# Patient Record
Sex: Female | Born: 1935 | Race: White | Hispanic: No | Marital: Married | Smoking: Never smoker
Health system: Southern US, Community
[De-identification: ages and names within clinical notes are randomized; demographics above are authoritative.]

## PROBLEM LIST (undated history)

## (undated) DIAGNOSIS — I10 Essential (primary) hypertension: Secondary | ICD-10-CM

## (undated) DIAGNOSIS — J704 Drug-induced interstitial lung disorders, unspecified: Secondary | ICD-10-CM

## (undated) DIAGNOSIS — R112 Nausea with vomiting, unspecified: Secondary | ICD-10-CM

## (undated) DIAGNOSIS — M1990 Unspecified osteoarthritis, unspecified site: Secondary | ICD-10-CM

## (undated) DIAGNOSIS — Z9889 Other specified postprocedural states: Secondary | ICD-10-CM

## (undated) DIAGNOSIS — Z87440 Personal history of urinary (tract) infections: Secondary | ICD-10-CM

## (undated) HISTORY — PX: ABDOMINAL HYSTERECTOMY: SHX81

## (undated) HISTORY — PX: OTHER SURGICAL HISTORY: SHX169

## (undated) HISTORY — PX: POLYPECTOMY: SHX149

## (undated) HISTORY — PX: ROTATOR CUFF REPAIR: SHX139

## (undated) HISTORY — PX: SHOULDER SURGERY: SHX246

---

## 1999-05-10 ENCOUNTER — Other Ambulatory Visit: Admission: RE | Admit: 1999-05-10 | Discharge: 1999-05-10 | Payer: Self-pay | Admitting: Gynecology

## 2000-03-02 ENCOUNTER — Encounter: Admission: RE | Admit: 2000-03-02 | Discharge: 2000-03-02 | Payer: Self-pay | Admitting: Rheumatology

## 2000-03-02 ENCOUNTER — Encounter: Payer: Self-pay | Admitting: Rheumatology

## 2000-05-15 ENCOUNTER — Other Ambulatory Visit: Admission: RE | Admit: 2000-05-15 | Discharge: 2000-05-15 | Payer: Self-pay | Admitting: Gynecology

## 2000-06-07 ENCOUNTER — Encounter: Payer: Self-pay | Admitting: Gynecology

## 2000-06-07 ENCOUNTER — Encounter: Admission: RE | Admit: 2000-06-07 | Discharge: 2000-06-07 | Payer: Self-pay | Admitting: Gynecology

## 2000-06-27 ENCOUNTER — Ambulatory Visit (HOSPITAL_COMMUNITY): Admission: RE | Admit: 2000-06-27 | Discharge: 2000-06-27 | Payer: Self-pay | Admitting: Gastroenterology

## 2001-03-13 ENCOUNTER — Encounter: Payer: Self-pay | Admitting: Rheumatology

## 2001-03-13 ENCOUNTER — Encounter: Admission: RE | Admit: 2001-03-13 | Discharge: 2001-03-13 | Payer: Self-pay | Admitting: Rheumatology

## 2001-05-21 ENCOUNTER — Other Ambulatory Visit: Admission: RE | Admit: 2001-05-21 | Discharge: 2001-05-21 | Payer: Self-pay | Admitting: Gynecology

## 2002-03-14 ENCOUNTER — Encounter: Payer: Self-pay | Admitting: Rheumatology

## 2002-03-14 ENCOUNTER — Encounter: Admission: RE | Admit: 2002-03-14 | Discharge: 2002-03-14 | Payer: Self-pay | Admitting: Rheumatology

## 2002-05-22 ENCOUNTER — Other Ambulatory Visit: Admission: RE | Admit: 2002-05-22 | Discharge: 2002-05-22 | Payer: Self-pay | Admitting: Gynecology

## 2003-03-28 ENCOUNTER — Encounter: Payer: Self-pay | Admitting: Rheumatology

## 2003-03-28 ENCOUNTER — Encounter: Admission: RE | Admit: 2003-03-28 | Discharge: 2003-03-28 | Payer: Self-pay | Admitting: Rheumatology

## 2003-05-27 ENCOUNTER — Other Ambulatory Visit: Admission: RE | Admit: 2003-05-27 | Discharge: 2003-05-27 | Payer: Self-pay | Admitting: Gynecology

## 2004-03-31 ENCOUNTER — Encounter: Admission: RE | Admit: 2004-03-31 | Discharge: 2004-03-31 | Payer: Self-pay | Admitting: Rheumatology

## 2004-05-31 ENCOUNTER — Other Ambulatory Visit: Admission: RE | Admit: 2004-05-31 | Discharge: 2004-05-31 | Payer: Self-pay | Admitting: Gynecology

## 2005-04-29 ENCOUNTER — Encounter: Admission: RE | Admit: 2005-04-29 | Discharge: 2005-04-29 | Payer: Self-pay | Admitting: Rheumatology

## 2005-06-13 ENCOUNTER — Other Ambulatory Visit: Admission: RE | Admit: 2005-06-13 | Discharge: 2005-06-13 | Payer: Self-pay | Admitting: Gynecology

## 2006-05-01 ENCOUNTER — Encounter: Admission: RE | Admit: 2006-05-01 | Discharge: 2006-05-01 | Payer: Self-pay | Admitting: Family Medicine

## 2006-05-05 ENCOUNTER — Encounter: Admission: RE | Admit: 2006-05-05 | Discharge: 2006-05-05 | Payer: Self-pay | Admitting: Family Medicine

## 2006-07-11 ENCOUNTER — Other Ambulatory Visit: Admission: RE | Admit: 2006-07-11 | Discharge: 2006-07-11 | Payer: Self-pay | Admitting: Gynecology

## 2006-11-03 ENCOUNTER — Encounter: Admission: RE | Admit: 2006-11-03 | Discharge: 2006-11-03 | Payer: Self-pay | Admitting: Family Medicine

## 2007-05-03 ENCOUNTER — Encounter: Admission: RE | Admit: 2007-05-03 | Discharge: 2007-05-03 | Payer: Self-pay | Admitting: Family Medicine

## 2007-07-16 ENCOUNTER — Other Ambulatory Visit: Admission: RE | Admit: 2007-07-16 | Discharge: 2007-07-16 | Payer: Self-pay | Admitting: Gynecology

## 2008-05-05 ENCOUNTER — Encounter: Admission: RE | Admit: 2008-05-05 | Discharge: 2008-05-05 | Payer: Self-pay | Admitting: Family Medicine

## 2008-05-28 ENCOUNTER — Encounter (HOSPITAL_BASED_OUTPATIENT_CLINIC_OR_DEPARTMENT_OTHER): Payer: Self-pay | Admitting: General Surgery

## 2008-05-28 ENCOUNTER — Ambulatory Visit (HOSPITAL_BASED_OUTPATIENT_CLINIC_OR_DEPARTMENT_OTHER): Admission: RE | Admit: 2008-05-28 | Discharge: 2008-05-28 | Payer: Self-pay | Admitting: General Surgery

## 2009-04-08 ENCOUNTER — Encounter: Admission: RE | Admit: 2009-04-08 | Discharge: 2009-04-08 | Payer: Self-pay | Admitting: Gastroenterology

## 2009-05-06 ENCOUNTER — Encounter: Admission: RE | Admit: 2009-05-06 | Discharge: 2009-05-06 | Payer: Self-pay | Admitting: Family Medicine

## 2009-07-27 ENCOUNTER — Encounter: Admission: RE | Admit: 2009-07-27 | Discharge: 2009-07-27 | Payer: Self-pay | Admitting: Family Medicine

## 2009-10-19 ENCOUNTER — Encounter: Admission: RE | Admit: 2009-10-19 | Discharge: 2009-10-19 | Payer: Self-pay | Admitting: Family Medicine

## 2010-05-07 ENCOUNTER — Encounter: Admission: RE | Admit: 2010-05-07 | Discharge: 2010-05-07 | Payer: Self-pay | Admitting: Family Medicine

## 2010-10-24 ENCOUNTER — Encounter: Payer: Self-pay | Admitting: Family Medicine

## 2011-02-15 NOTE — Op Note (Signed)
NAMEMARIJOSE, Freeman               ACCOUNT NO.:  [id number]   MEDICAL RECORD NO.:  [id number]          PATIENT TYPE:  AMB   LOCATION:  DSC                          FACILITY:  [facility]   PHYSICIAN:  Leonie Man, M.D.   DATE OF BIRTH:  06-Jul-1936   DATE OF PROCEDURE:  05/28/2008  DATE OF DISCHARGE:                               OPERATIVE REPORT   PREOPERATIVE DIAGNOSIS:  Lesion of left anal verge, 4 o'clock axis.   POSTOPERATIVE DIAGNOSIS:  Lesion of left anal verge, 4 o'clock axis.   PROCEDURES:  Excision of lesion at the anal verge, 4 o'clock axis.   SURGEON:  Leonie Man, MD   ASSISTANT:  OR tech.   ANESTHESIA:  General.   NOTE:  [name] is a 75 year old lady with what appears to be  an epidermoid inclusion cyst at the anal verge which has been causing  her some discomfort.  This waxed and waned in size.  This is not  consistent with an external hemorrhoid.  She comes to the operating room  for excision of this mass after the risks and potential benefits of  surgery have been discussed.  All questions answered and consent  obtained.   PROCEDURE:  The patient positioned in the prone jackknife position with  the buttock cheeks spread apart after following induction of  satisfactory general anesthesia.  This area was prepped and draped to be  included in a sterile operative field and the place of the area was  outlined in elliptical incision around the anal lesion and infiltrated  with 0.5% Marcaine with epinephrine.  An elliptical incision was made  around the lesion.  The lesion was excised in its entirety and removed  and forwarded for pathologic evaluation.  Hemostasis was obtained with  electrocautery.  The skin and subcutaneous tissues were closed with a  running suture of 4-0 chromic catgut.  Sterile dressing applied.  The  anesthetic reversed.  The patient removed from the operating room to the  recovery room in stable condition.  She tolerated the  procedure well.      Leonie Man, M.D.  Electronically Signed     PB/MEDQ  D:  05/28/2008  T:  05/28/2008  Job:  [id number]

## 2011-04-12 ENCOUNTER — Other Ambulatory Visit: Payer: Self-pay | Admitting: Family Medicine

## 2011-04-12 DIAGNOSIS — Z1231 Encounter for screening mammogram for malignant neoplasm of breast: Secondary | ICD-10-CM

## 2011-05-10 ENCOUNTER — Ambulatory Visit
Admission: RE | Admit: 2011-05-10 | Discharge: 2011-05-10 | Disposition: A | Discharge status: Home / Self Care | Payer: Medicare Other | Source: Ambulatory Visit | Attending: Family Medicine | Admitting: Family Medicine

## 2011-05-10 DIAGNOSIS — Z1231 Encounter for screening mammogram for malignant neoplasm of breast: Secondary | ICD-10-CM

## 2011-10-17 ENCOUNTER — Other Ambulatory Visit: Payer: Self-pay | Admitting: Dermatology

## 2011-10-27 ENCOUNTER — Other Ambulatory Visit: Payer: Self-pay | Admitting: Family Medicine

## 2011-10-27 DIAGNOSIS — M545 Low back pain: Secondary | ICD-10-CM

## 2011-10-31 ENCOUNTER — Ambulatory Visit
Admission: RE | Admit: 2011-10-31 | Discharge: 2011-10-31 | Disposition: A | Discharge status: Home / Self Care | Payer: Medicare Other | Source: Ambulatory Visit | Attending: Family Medicine | Admitting: Family Medicine

## 2011-10-31 DIAGNOSIS — M545 Low back pain: Secondary | ICD-10-CM

## 2012-02-16 ENCOUNTER — Other Ambulatory Visit: Payer: Self-pay | Admitting: Orthopedic Surgery

## 2012-02-16 NOTE — Progress Notes (Signed)
Preoperative surgical orders have been place into the Epic hospital system for Casey Freeman on 02/16/2012, 5:42 PM  by Patrica Duel for surgery on 05/01/2012.  Preop Total Knee orders including Bupivacaine On-Q pump, IV Tylenol, and IV Decadron as long as there are no contraindications to the above medications.

## 2012-04-18 NOTE — H&P (Signed)
Mauricio Po DOB: 1935/11/02  Chief Complaint: right knee pain   History of Present Illness The patient is a 76 year old female who comes in today for a preoperative History and Physical. The patient is scheduled for a right total knee arthroplasty to be performed by Dr. Gus Rankin. Aluisio, MD at [facility] on Tuesday May 01, 2012 . Kendyl has been dealing with right knee pain for over a year. She has seen Dr. Rennis Chris, had injections which helped for about six months or so. She said that pain has gotten much worse. It is becoming more activity limiting. It is hurting her at night. She is not getting any significant swelling. She is not having lower extremity weakness or paresthesia. The cortisone helped much better with the initial injections but the recent ones were not as good. Due to failure of conservative measures, the most predictable means for increased pain and decreased function of the right knee is a right total knee arthroplasty. Risks and benefits of the surgery discussed.   Problem List/Past Medical Tear, medial meniscus, knee, current (836.0).  Sprain/strain, rotator cuff (840.4) Enthesopathy, ankle NOS (726.70).  Tendinitis, tibialis (726.72).  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  Hypertension Varicose veins Diverticulosis Urinary Tract Infection   Allergies No Known Drug Allergies.    Family History Hypertension. mother and father Kidney disease. brother Cancer. brother Cerebrovascular Accident. father Diabetes Mellitus. mother Father. deceased age 19 due to heart disease, MI, CVA Mother. deceased age 27 due to complications from DM Siblings. esophageal/throat cancer, kidney failure   Social History Drug/Alcohol Rehab (Currently). no Drug/Alcohol Rehab (Previously). no Children. 1 Alcohol use. never consumed alcohol Exercise. Exercises rarely Number of flights of stairs before winded. 2-3 Marital status. married Pain  Contract. no Illicit drug use. 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 never smoker Tobacco / smoke exposure. no Post-Surgical Plans. home with husband Advance Directives. living will, healthcare POA   Medication History Lisinopril-Hydrochlorothiazide (20-25MG  Tablet, Oral) Active. LORazepam ( Oral) Specific dose unknown - Active. Vivelle-Dot (0.1MG /24HR Patch Biweekly, Transdermal) Active. Premarin (0.625MG /GM Cream, Vaginal) Active. Calcium Citrate (200MG  Tablet, 1 Oral) Active. Multiple Vitamin (1 Oral) Active. Vitamin D (2000UNIT Tablet, 1 Oral) Active. Magnesium Gluconate (500MG  Tablet, Oral) Active. Fish Oil Active. Aspirin EC (81MG  Tablet DR, Oral) Active. Cranberry Plus Vitamin C (4200-20-3MG -MG-UNIT Capsule, Oral) Active.     Past Surgical History Arthroscopy of Shoulder. left 2001 Colon Polyp Removal - Colonoscopy Hysterectomy. complete (non-cancerous) 1983 Rotator Cuff Repair. right 2011 Gluteal cyst removal 2009 Cataract Extraction-Bilateral. 2012   Review of Systems General:Not Present- Chills, Fever, Night Sweats, Fatigue, Weight Gain, Weight Loss and Memory Loss. Skin:Not Present- Hives, Itching, Rash, Eczema and Lesions. HEENT:Not Present- Tinnitus, Headache, Double Vision, Visual Loss, Hearing Loss and Dentures. Respiratory:Not Present- Shortness of breath with exertion, Shortness of breath at rest, Allergies, Coughing up blood and Chronic Cough. Cardiovascular:Not Present- Chest Pain, Racing/skipping heartbeats, Difficulty Breathing Lying Down, Murmur, Swelling and Palpitations. Gastrointestinal:Not Present- Bloody Stool, Heartburn, Abdominal Pain, Vomiting, Nausea, Constipation, Diarrhea, Difficulty Swallowing, Jaundice and Loss of appetitie. Female Genitourinary:Not Present- Blood in Urine, Urinary frequency, Weak urinary stream, Discharge, Flank Pain, Incontinence, Painful Urination,  Urgency, Urinary Retention and Urinating at Night. Musculoskeletal:Present- Joint Swelling, Joint Pain and Morning Stiffness. Not Present- Muscle Weakness, Muscle Pain, Back Pain and Spasms. Neurological:Not Present- Tremor, Dizziness, Blackout spells, Paralysis, Difficulty with balance and Weakness. Psychiatric:Not Present- Insomnia.   Vitals Weight: 198 lb Height: 63 in Body Surface Area: 2 m  Body Mass Index: 35.07 kg/m Pulse: 75 (Regular) BP: 126/72 (Sitting, Right Arm, Standard)    Physical Exam General Mental Status - Alert, cooperative and good historian. General Appearance- pleasant. Not in acute distress. Orientation- Oriented X3. Build & Nutrition- Obese and Well developed. Head and Neck Head- normocephalic, atraumatic . Neck Global Assessment- supple. no bruit auscultated on the right and no bruit auscultated on the left. Eye Pupil- Bilateral- Regular and Round. Motion- Bilateral- EOMI. Chest and Lung Exam Auscultation: Breath sounds:- clear at anterior chest wall and - clear at posterior chest wall. Adventitious sounds:- No Adventitious sounds. Cardiovascular Auscultation:Rhythm- Regular rate and rhythm. Heart Sounds- S1 WNL and S2 WNL. Murmurs & Other Heart Sounds:Auscultation of the heart reveals - No Murmurs. Abdomen Inspection:Contour- Obese. Palpation/Percussion:Tenderness- Abdomen is non-tender to palpation. Rigidity (guarding)- Abdomen is soft. Auscultation:Auscultation of the abdomen reveals - Bowel sounds normal. Female Genitourinary Not done, not pertinent to present illness Peripheral Vascular Upper Extremity: Palpation:- Pulses bilaterally normal. Lower Extremity: Palpation:- Pulses bilaterally normal Neurologic Examination of related systems reveals - normal muscle strength and tone in all extremities. Neurologic evaluation reveals - normal sensation and upper and lower extremity deep tendon  reflexes intact bilaterally . Musculoskeletal Both knees show no effusion. Range is about 10 to about 95 degrees. She is tender medial greater than lateral. There is no instability noted. Left knee exam is unremarkable. Normal painless motion in the hips.   RADIOGRAPHS: AP both knees and lateral show severe endstage arthritis, right worse than left knee, with bone on bone deformity, varus deformity and tibial subluxation medially. Left knee shows milder change.   Assessment & Plan Osteoarthritis, knee (715.96) Right total knee arthroplasty     Dimitri Ped, PA-C

## 2012-04-19 ENCOUNTER — Encounter (HOSPITAL_COMMUNITY): Payer: Self-pay | Admitting: Pharmacy Technician

## 2012-04-23 ENCOUNTER — Ambulatory Visit (HOSPITAL_COMMUNITY)
Admission: RE | Admit: 2012-04-23 | Discharge: 2012-04-23 | Disposition: A | Discharge status: Home / Self Care | Payer: Medicare Other | Source: Ambulatory Visit | Attending: Orthopedic Surgery | Admitting: Orthopedic Surgery

## 2012-04-23 ENCOUNTER — Encounter (HOSPITAL_COMMUNITY)
Admission: RE | Admit: 2012-04-23 | Discharge: 2012-04-23 | Disposition: A | Discharge status: Home / Self Care | Payer: Medicare Other | Source: Ambulatory Visit | Attending: Orthopedic Surgery | Admitting: Orthopedic Surgery

## 2012-04-23 ENCOUNTER — Encounter (HOSPITAL_COMMUNITY): Payer: Self-pay

## 2012-04-23 DIAGNOSIS — M47814 Spondylosis without myelopathy or radiculopathy, thoracic region: Secondary | ICD-10-CM | POA: Insufficient documentation

## 2012-04-23 DIAGNOSIS — Z01812 Encounter for preprocedural laboratory examination: Secondary | ICD-10-CM | POA: Insufficient documentation

## 2012-04-23 DIAGNOSIS — M1710 Unilateral primary osteoarthritis, unspecified knee: Secondary | ICD-10-CM | POA: Insufficient documentation

## 2012-04-23 DIAGNOSIS — Z01810 Encounter for preprocedural cardiovascular examination: Secondary | ICD-10-CM | POA: Insufficient documentation

## 2012-04-23 HISTORY — DX: Personal history of urinary (tract) infections: Z87.440

## 2012-04-23 HISTORY — DX: Other specified postprocedural states: Z98.890

## 2012-04-23 HISTORY — DX: Essential (primary) hypertension: I10

## 2012-04-23 HISTORY — DX: Nausea with vomiting, unspecified: R11.2

## 2012-04-23 HISTORY — DX: Unspecified osteoarthritis, unspecified site: M19.90

## 2012-04-23 LAB — COMPREHENSIVE METABOLIC PANEL
ALT: 9 U/L (ref 0–35)
AST: 16 U/L (ref 0–37)
Albumin: 4 g/dL (ref 3.5–5.2)
Calcium: 9.7 mg/dL (ref 8.4–10.5)
Chloride: 99 mEq/L (ref 96–112)
Creatinine, Ser: 0.58 mg/dL (ref 0.50–1.10)
Sodium: 136 mEq/L (ref 135–145)

## 2012-04-23 LAB — SURGICAL PCR SCREEN
MRSA, PCR: NEGATIVE
Staphylococcus aureus: NEGATIVE

## 2012-04-23 LAB — URINALYSIS, ROUTINE W REFLEX MICROSCOPIC
Leukocytes, UA: NEGATIVE
Nitrite: NEGATIVE
Specific Gravity, Urine: 1.014 (ref 1.005–1.030)
pH: 7 (ref 5.0–8.0)

## 2012-04-23 LAB — CBC
MCH: 31.1 pg (ref 26.0–34.0)
MCV: 91.6 fL (ref 78.0–100.0)
Platelets: 247 10*3/uL (ref 150–400)
RDW: 12.8 % (ref 11.5–15.5)
WBC: 6.7 10*3/uL (ref 4.0–10.5)

## 2012-04-23 LAB — PROTIME-INR: INR: 1.01 (ref 0.00–1.49)

## 2012-04-23 MED ORDER — CHLORHEXIDINE GLUCONATE 4 % EX LIQD
60.0000 mL | Freq: Once | CUTANEOUS | Status: DC
  Filled 2012-04-23: qty 60

## 2012-04-23 NOTE — Patient Instructions (Addendum)
20 CIMBERLY STOFFEL  04/23/2012   Your procedure is scheduled on:  05-01-12 at 2:30 pm  Report to SHORT STAY DEPT  at 12:00 PM.  Call this number if you have problems the morning of surgery: [phone]   Remember:   Do not eat food or drink liquids AFTER MIDNIGHT  May have clear liquids UNTIL 6 HOURS BEFORE SURGERY (8:30 am)  Clear liquids include soda, tea, black coffee, apple or grape juice, broth.  Take these medicines the morning of surgery with A SIP OF WATER: NONE   Do not wear jewelry, make-up or nail polish.  Do not wear lotions, powders, or perfumes.   Do not shave legs or underarms 48 hrs. before surgery (men may shave face)  Do not bring valuables to the hospital.  Contacts, dentures or bridgework may not be worn into surgery.  Leave suitcase in the car. After surgery it may be brought to your room.  For patients admitted to the hospital, checkout time is 11:00 AM the day of discharge.   Patients discharged the day of surgery will not be allowed to drive home.    Special Instructions:   Please read over the following fact sheets that you were given: MRSA  Information/ Incentive Spirometer               SHOWER WITH BETASEPT THE NIGHT BEFORE SURGERY AND THE MORNING OF SURGERY

## 2012-05-01 ENCOUNTER — Encounter (HOSPITAL_COMMUNITY): Payer: Self-pay | Admitting: Anesthesiology

## 2012-05-01 ENCOUNTER — Encounter (HOSPITAL_COMMUNITY)
Admission: RE | Disposition: A | Discharge status: Home Health | Payer: Self-pay | Source: Ambulatory Visit | Attending: Orthopedic Surgery

## 2012-05-01 ENCOUNTER — Ambulatory Visit (HOSPITAL_COMMUNITY): Payer: Medicare Other | Admitting: Anesthesiology

## 2012-05-01 ENCOUNTER — Inpatient Hospital Stay (HOSPITAL_COMMUNITY)
Admission: RE | Admit: 2012-05-01 | Discharge: 2012-05-04 | DRG: 470 | Disposition: A | Discharge status: Home Health | Payer: Medicare Other | Source: Ambulatory Visit | Attending: Orthopedic Surgery | Admitting: Orthopedic Surgery

## 2012-05-01 ENCOUNTER — Encounter (HOSPITAL_COMMUNITY): Payer: Self-pay | Admitting: *Deleted

## 2012-05-01 DIAGNOSIS — E876 Hypokalemia: Secondary | ICD-10-CM | POA: Diagnosis not present

## 2012-05-01 DIAGNOSIS — M1710 Unilateral primary osteoarthritis, unspecified knee: Principal | ICD-10-CM | POA: Diagnosis present

## 2012-05-01 DIAGNOSIS — Z96659 Presence of unspecified artificial knee joint: Secondary | ICD-10-CM

## 2012-05-01 HISTORY — PX: TOTAL KNEE ARTHROPLASTY: SHX125

## 2012-05-01 LAB — TYPE AND SCREEN: ABO/RH(D): A POS

## 2012-05-01 SURGERY — ARTHROPLASTY, KNEE, TOTAL
Anesthesia: Spinal | Site: Knee | Laterality: Right | Wound class: Clean

## 2012-05-01 MED ORDER — LACTATED RINGERS IV SOLN: INTRAVENOUS | Status: DC

## 2012-05-01 MED ORDER — LACTATED RINGERS IV SOLN
INTRAVENOUS | Status: DC | PRN
  Administered 2012-05-01 (×2): via INTRAVENOUS

## 2012-05-01 MED ORDER — HYDROMORPHONE HCL PF 1 MG/ML IJ SOLN: 0.2500 mg | INTRAMUSCULAR | Status: DC | PRN

## 2012-05-01 MED ORDER — PROPOFOL 10 MG/ML IV EMUL
INTRAVENOUS | Status: DC | PRN
  Administered 2012-05-01: 75 ug/kg/min via INTRAVENOUS

## 2012-05-01 MED ORDER — TRAMADOL HCL 50 MG PO TABS: 50.0000 mg | ORAL_TABLET | Freq: Four times a day (QID) | ORAL | Status: DC | PRN

## 2012-05-01 MED ORDER — DIPHENHYDRAMINE HCL 12.5 MG/5ML PO ELIX: 12.5000 mg | ORAL_SOLUTION | Freq: Four times a day (QID) | ORAL | Status: DC | PRN

## 2012-05-01 MED ORDER — ONDANSETRON HCL 4 MG/2ML IJ SOLN
4.0000 mg | Freq: Four times a day (QID) | INTRAMUSCULAR | Status: DC | PRN
  Administered 2012-05-01: 4 mg via INTRAVENOUS

## 2012-05-01 MED ORDER — FENTANYL CITRATE 0.05 MG/ML IJ SOLN
INTRAMUSCULAR | Status: DC | PRN
  Administered 2012-05-01: 50 ug via INTRAVENOUS

## 2012-05-01 MED ORDER — BUPIVACAINE 0.25 % ON-Q PUMP SINGLE CATH 300ML
INJECTION | Status: DC | PRN
  Administered 2012-05-01: 300 mL

## 2012-05-01 MED ORDER — NALOXONE HCL 0.4 MG/ML IJ SOLN: 0.4000 mg | INTRAMUSCULAR | Status: DC | PRN

## 2012-05-01 MED ORDER — POLYETHYLENE GLYCOL 3350 17 G PO PACK: 17.0000 g | PACK | Freq: Every day | ORAL | Status: DC | PRN

## 2012-05-01 MED ORDER — LORAZEPAM 0.5 MG PO TABS
0.5000 mg | ORAL_TABLET | Freq: Two times a day (BID) | ORAL | Status: DC
  Administered 2012-05-01 – 2012-05-03 (×3): 0.5 mg via ORAL
  Filled 2012-05-01 (×5): qty 1

## 2012-05-01 MED ORDER — BUPIVACAINE ON-Q PAIN PUMP (FOR ORDER SET NO CHG)
INJECTION | Status: DC
  Filled 2012-05-01: qty 1

## 2012-05-01 MED ORDER — METHOCARBAMOL 100 MG/ML IJ SOLN
500.0000 mg | Freq: Four times a day (QID) | INTRAMUSCULAR | Status: DC | PRN
  Administered 2012-05-01: 500 mg via INTRAVENOUS
  Filled 2012-05-01: qty 5

## 2012-05-01 MED ORDER — ONDANSETRON HCL 4 MG/2ML IJ SOLN
4.0000 mg | Freq: Four times a day (QID) | INTRAMUSCULAR | Status: DC | PRN
  Filled 2012-05-01: qty 2

## 2012-05-01 MED ORDER — DIPHENHYDRAMINE HCL 50 MG/ML IJ SOLN: 12.5000 mg | Freq: Four times a day (QID) | INTRAMUSCULAR | Status: DC | PRN

## 2012-05-01 MED ORDER — CEFAZOLIN SODIUM 1-5 GM-% IV SOLN
1.0000 g | Freq: Four times a day (QID) | INTRAVENOUS | Status: AC
  Administered 2012-05-01 – 2012-05-02 (×2): 1 g via INTRAVENOUS
  Filled 2012-05-01 (×2): qty 50

## 2012-05-01 MED ORDER — ACETAMINOPHEN 10 MG/ML IV SOLN
INTRAVENOUS | Status: AC
  Filled 2012-05-01: qty 100

## 2012-05-01 MED ORDER — LACTATED RINGERS IV SOLN
INTRAVENOUS | Status: DC
  Administered 2012-05-01: 1000 mL via INTRAVENOUS

## 2012-05-01 MED ORDER — METOCLOPRAMIDE HCL 10 MG PO TABS: 5.0000 mg | ORAL_TABLET | Freq: Three times a day (TID) | ORAL | Status: DC | PRN

## 2012-05-01 MED ORDER — MIDAZOLAM HCL 5 MG/5ML IJ SOLN
INTRAMUSCULAR | Status: DC | PRN
  Administered 2012-05-01: 0.5 mg via INTRAVENOUS

## 2012-05-01 MED ORDER — ACETAMINOPHEN 325 MG PO TABS
650.0000 mg | ORAL_TABLET | Freq: Four times a day (QID) | ORAL | Status: DC | PRN
  Administered 2012-05-03 (×2): 650 mg via ORAL
  Filled 2012-05-01 (×2): qty 2

## 2012-05-01 MED ORDER — DIPHENHYDRAMINE HCL 12.5 MG/5ML PO ELIX: 12.5000 mg | ORAL_SOLUTION | ORAL | Status: DC | PRN

## 2012-05-01 MED ORDER — CEFAZOLIN SODIUM-DEXTROSE 2-3 GM-% IV SOLR
INTRAVENOUS | Status: AC
  Filled 2012-05-01: qty 50

## 2012-05-01 MED ORDER — SODIUM CHLORIDE 0.9 % IJ SOLN: 9.0000 mL | INTRAMUSCULAR | Status: DC | PRN

## 2012-05-01 MED ORDER — METOCLOPRAMIDE HCL 5 MG/ML IJ SOLN
5.0000 mg | Freq: Three times a day (TID) | INTRAMUSCULAR | Status: DC | PRN
Start: 2012-05-01 — End: 2012-05-04
  Administered 2012-05-02: 10 mg via INTRAVENOUS
  Filled 2012-05-01: qty 2

## 2012-05-01 MED ORDER — BUPIVACAINE IN DEXTROSE 0.75-8.25 % IT SOLN
INTRATHECAL | Status: DC | PRN
  Administered 2012-05-01: 1.8 mL via INTRATHECAL

## 2012-05-01 MED ORDER — BISACODYL 10 MG RE SUPP: 10.0000 mg | Freq: Every day | RECTAL | Status: DC | PRN

## 2012-05-01 MED ORDER — MORPHINE SULFATE (PF) 1 MG/ML IV SOLN
INTRAVENOUS | Status: DC
  Administered 2012-05-01: 14.94 mg via INTRAVENOUS
  Administered 2012-05-01: 1.8 mg via INTRAVENOUS
  Administered 2012-05-02: 10 mg via INTRAVENOUS
  Filled 2012-05-01: qty 25

## 2012-05-01 MED ORDER — MORPHINE SULFATE (PF) 1 MG/ML IV SOLN
INTRAVENOUS | Status: DC
  Administered 2012-05-01: 18:00:00 via INTRAVENOUS

## 2012-05-01 MED ORDER — FLEET ENEMA 7-19 GM/118ML RE ENEM: 1.0000 | ENEMA | Freq: Once | RECTAL | Status: AC | PRN

## 2012-05-01 MED ORDER — ONDANSETRON HCL 4 MG PO TABS
4.0000 mg | ORAL_TABLET | Freq: Four times a day (QID) | ORAL | Status: DC | PRN
  Administered 2012-05-02: 4 mg via ORAL
  Filled 2012-05-01: qty 1

## 2012-05-01 MED ORDER — DEXAMETHASONE SODIUM PHOSPHATE 10 MG/ML IJ SOLN
10.0000 mg | Freq: Once | INTRAMUSCULAR | Status: DC
  Filled 2012-05-01: qty 1

## 2012-05-01 MED ORDER — ACETAMINOPHEN 650 MG RE SUPP: 650.0000 mg | Freq: Four times a day (QID) | RECTAL | Status: DC | PRN

## 2012-05-01 MED ORDER — OXYCODONE HCL 5 MG PO TABS
5.0000 mg | ORAL_TABLET | ORAL | Status: DC | PRN
  Administered 2012-05-02: 10 mg via ORAL
  Administered 2012-05-02 – 2012-05-03 (×3): 5 mg via ORAL
  Administered 2012-05-03: 10 mg via ORAL
  Administered 2012-05-03 – 2012-05-04 (×7): 5 mg via ORAL
  Filled 2012-05-01: qty 2
  Filled 2012-05-01 (×5): qty 1
  Filled 2012-05-01: qty 2
  Filled 2012-05-01: qty 1
  Filled 2012-05-01: qty 2
  Filled 2012-05-01 (×3): qty 1

## 2012-05-01 MED ORDER — MEPERIDINE HCL 50 MG/ML IJ SOLN: 6.2500 mg | INTRAMUSCULAR | Status: DC | PRN

## 2012-05-01 MED ORDER — PHENOL 1.4 % MT LIQD: 1.0000 | OROMUCOSAL | Status: DC | PRN

## 2012-05-01 MED ORDER — ACETAMINOPHEN 10 MG/ML IV SOLN
1000.0000 mg | Freq: Four times a day (QID) | INTRAVENOUS | Status: AC
  Administered 2012-05-01 – 2012-05-02 (×4): 1000 mg via INTRAVENOUS
  Filled 2012-05-01 (×7): qty 100

## 2012-05-01 MED ORDER — ACETAMINOPHEN 10 MG/ML IV SOLN
INTRAVENOUS | Status: DC | PRN
  Administered 2012-05-01: 1000 mg via INTRAVENOUS

## 2012-05-01 MED ORDER — DOCUSATE SODIUM 100 MG PO CAPS
100.0000 mg | ORAL_CAPSULE | Freq: Two times a day (BID) | ORAL | Status: DC
  Administered 2012-05-01 – 2012-05-03 (×5): 100 mg via ORAL

## 2012-05-01 MED ORDER — PROMETHAZINE HCL 25 MG/ML IJ SOLN: 6.2500 mg | INTRAMUSCULAR | Status: DC | PRN

## 2012-05-01 MED ORDER — BUPIVACAINE 0.25 % ON-Q PUMP SINGLE CATH 300ML
300.0000 mL | INJECTION | Status: DC
  Filled 2012-05-01: qty 300

## 2012-05-01 MED ORDER — METHOCARBAMOL 500 MG PO TABS
500.0000 mg | ORAL_TABLET | Freq: Four times a day (QID) | ORAL | Status: DC | PRN
  Administered 2012-05-02 – 2012-05-04 (×4): 500 mg via ORAL
  Filled 2012-05-01 (×4): qty 1

## 2012-05-01 MED ORDER — CEFAZOLIN SODIUM-DEXTROSE 2-3 GM-% IV SOLR
2.0000 g | INTRAVENOUS | Status: AC
  Administered 2012-05-01: 2 g via INTRAVENOUS

## 2012-05-01 MED ORDER — MORPHINE SULFATE (PF) 1 MG/ML IV SOLN
INTRAVENOUS | Status: AC
  Filled 2012-05-01: qty 25

## 2012-05-01 MED ORDER — RIVAROXABAN 10 MG PO TABS
10.0000 mg | ORAL_TABLET | Freq: Every day | ORAL | Status: DC
  Administered 2012-05-02 – 2012-05-04 (×3): 10 mg via ORAL
  Filled 2012-05-01 (×4): qty 1

## 2012-05-01 MED ORDER — MENTHOL 3 MG MT LOZG
1.0000 | LOZENGE | OROMUCOSAL | Status: DC | PRN
  Filled 2012-05-01: qty 9

## 2012-05-01 MED ORDER — ACETAMINOPHEN 10 MG/ML IV SOLN: 1000.0000 mg | Freq: Once | INTRAVENOUS | Status: DC

## 2012-05-01 MED ORDER — SODIUM CHLORIDE 0.9 % IV SOLN: INTRAVENOUS | Status: DC

## 2012-05-01 MED ORDER — DEXTROSE-NACL 5-0.9 % IV SOLN
INTRAVENOUS | Status: DC
  Administered 2012-05-01 – 2012-05-02 (×2): via INTRAVENOUS

## 2012-05-01 MED ORDER — 0.9 % SODIUM CHLORIDE (POUR BTL) OPTIME
TOPICAL | Status: DC | PRN
  Administered 2012-05-01: 1000 mL

## 2012-05-01 MED ORDER — ONDANSETRON HCL 4 MG/2ML IJ SOLN: 4.0000 mg | Freq: Four times a day (QID) | INTRAMUSCULAR | Status: DC | PRN

## 2012-05-01 SURGICAL SUPPLY — 55 items
BAG SPEC THK2 15X12 ZIP CLS (MISCELLANEOUS) ×1
BAG ZIPLOCK 12X15 (MISCELLANEOUS) ×2 IMPLANT
BANDAGE ELASTIC 6 VELCRO ST LF (GAUZE/BANDAGES/DRESSINGS) ×2 IMPLANT
BANDAGE ESMARK 6X9 LF (GAUZE/BANDAGES/DRESSINGS) ×1 IMPLANT
BLADE SAG 18X100X1.27 (BLADE) ×2 IMPLANT
BLADE SAW SGTL 11.0X1.19X90.0M (BLADE) ×2 IMPLANT
BNDG CMPR 9X6 STRL LF SNTH (GAUZE/BANDAGES/DRESSINGS) ×1
BNDG ESMARK 6X9 LF (GAUZE/BANDAGES/DRESSINGS) ×2
BOWL SMART MIX CTS (DISPOSABLE) ×2 IMPLANT
CATH KIT ON-Q SILVERSOAK 5 (CATHETERS) ×1 IMPLANT
CATH KIT ON-Q SILVERSOAK 5IN (CATHETERS) ×2 IMPLANT
CEMENT HV SMART SET (Cement) ×4 IMPLANT
CLOTH BEACON ORANGE TIMEOUT ST (SAFETY) ×2 IMPLANT
CLSR STERI-STRIP ANTIMIC 1/2X4 (GAUZE/BANDAGES/DRESSINGS) ×1 IMPLANT
CUFF TOURN SGL QUICK 34 (TOURNIQUET CUFF) ×2
CUFF TRNQT CYL 34X4X40X1 (TOURNIQUET CUFF) ×1 IMPLANT
DRAPE EXTREMITY T 121X128X90 (DRAPE) ×2 IMPLANT
DRAPE POUCH INSTRU U-SHP 10X18 (DRAPES) ×2 IMPLANT
DRAPE U-SHAPE 47X51 STRL (DRAPES) ×2 IMPLANT
DRSG ADAPTIC 3X8 NADH LF (GAUZE/BANDAGES/DRESSINGS) ×2 IMPLANT
DRSG PAD ABDOMINAL 8X10 ST (GAUZE/BANDAGES/DRESSINGS) ×1 IMPLANT
DURAPREP 26ML APPLICATOR (WOUND CARE) ×2 IMPLANT
ELECT REM PT RETURN 9FT ADLT (ELECTROSURGICAL) ×2
ELECTRODE REM PT RTRN 9FT ADLT (ELECTROSURGICAL) ×1 IMPLANT
EVACUATOR 1/8 PVC DRAIN (DRAIN) ×2 IMPLANT
FACESHIELD LNG OPTICON STERILE (SAFETY) ×10 IMPLANT
GLOVE BIO SURGEON STRL SZ7.5 (GLOVE) ×1 IMPLANT
GLOVE BIO SURGEON STRL SZ8 (GLOVE) ×2 IMPLANT
GLOVE BIOGEL PI IND STRL 8 (GLOVE) ×2 IMPLANT
GLOVE BIOGEL PI INDICATOR 8 (GLOVE) ×2
GOWN STRL NON-REIN LRG LVL3 (GOWN DISPOSABLE) ×2 IMPLANT
GOWN STRL REIN XL XLG (GOWN DISPOSABLE) ×2 IMPLANT
HANDPIECE INTERPULSE COAX TIP (DISPOSABLE) ×2
IMMOBILIZER KNEE 20 (SOFTGOODS) ×2
IMMOBILIZER KNEE 20 THIGH 36 (SOFTGOODS) ×1 IMPLANT
KIT BASIN OR (CUSTOM PROCEDURE TRAY) ×2 IMPLANT
MANIFOLD NEPTUNE II (INSTRUMENTS) ×2 IMPLANT
NS IRRIG 1000ML POUR BTL (IV SOLUTION) ×2 IMPLANT
PACK TOTAL JOINT (CUSTOM PROCEDURE TRAY) ×2 IMPLANT
PAD ABD 7.5X8 STRL (GAUZE/BANDAGES/DRESSINGS) ×1 IMPLANT
PADDING CAST COTTON 6X4 STRL (CAST SUPPLIES) ×4 IMPLANT
POSITIONER SURGICAL ARM (MISCELLANEOUS) ×2 IMPLANT
SET HNDPC FAN SPRY TIP SCT (DISPOSABLE) ×1 IMPLANT
SPONGE GAUZE 4X4 12PLY (GAUZE/BANDAGES/DRESSINGS) ×2 IMPLANT
STRIP CLOSURE SKIN 1/2X4 (GAUZE/BANDAGES/DRESSINGS) ×4 IMPLANT
SUCTION FRAZIER 12FR DISP (SUCTIONS) ×2 IMPLANT
SUT MNCRL AB 4-0 PS2 18 (SUTURE) ×2 IMPLANT
SUT PDS AB 1 CT1 27 (SUTURE) ×3 IMPLANT
SUT VIC AB 2-0 CT1 27 (SUTURE) ×6
SUT VIC AB 2-0 CT1 TAPERPNT 27 (SUTURE) ×3 IMPLANT
SUT VLOC 180 0 24IN GS25 (SUTURE) ×2 IMPLANT
TOWEL OR 17X26 10 PK STRL BLUE (TOWEL DISPOSABLE) ×4 IMPLANT
TRAY FOLEY CATH 14FRSI W/METER (CATHETERS) ×2 IMPLANT
WATER STERILE IRR 1500ML POUR (IV SOLUTION) ×2 IMPLANT
WRAP KNEE MAXI GEL POST OP (GAUZE/BANDAGES/DRESSINGS) ×3 IMPLANT

## 2012-05-01 NOTE — Transfer of Care (Signed)
Immediate Anesthesia Transfer of Care Note  Patient: Casey Freeman  Procedure(s) Performed: Procedure(s) (LRB): TOTAL KNEE ARTHROPLASTY (Right)  Patient Location: PACU  Anesthesia Type: Regional and Spinal  Level of Consciousness: awake, alert , oriented and patient cooperative  Airway & Oxygen Therapy: Patient Spontanous Breathing and Patient connected to face mask oxygen  Post-op Assessment: Report given to PACU RN and Post -op Vital signs reviewed and stable  Post vital signs: Reviewed and stable  Complications: No apparent anesthesia complications

## 2012-05-01 NOTE — Interval H&P Note (Signed)
History and Physical Interval Note:  05/01/2012 2:39 PM  Casey Freeman  has presented today for surgery, with the diagnosis of Osteoarthritis of the Right Knee  The various methods of treatment have been discussed with the patient and family. After consideration of risks, benefits and other options for treatment, the patient has consented to  Procedure(s) (LRB): TOTAL KNEE ARTHROPLASTY (Right) as a surgical intervention .  The patient's history has been reviewed, patient examined, no change in status, stable for surgery.  I have reviewed the patient's chart and labs.  Questions were answered to the patient's satisfaction.     Loanne Drilling

## 2012-05-01 NOTE — Anesthesia Preprocedure Evaluation (Addendum)
Anesthesia Evaluation    History of Anesthesia Complications (+) PONV  Airway Mallampati: II TM Distance: >3 FB Neck ROM: Full    Dental No notable dental hx. (+) Partial Lower and Partial Upper   Pulmonary  breath sounds clear to auscultation  Pulmonary exam normal       Cardiovascular hypertension, Pt. on medications Rhythm:Regular Rate:Normal     Neuro/Psych    GI/Hepatic   Endo/Other    Renal/GU      Musculoskeletal   Abdominal   Peds  Hematology   Anesthesia Other Findings   Reproductive/Obstetrics                          Anesthesia Physical Anesthesia Plan  ASA: II  Anesthesia Plan: Spinal   Post-op Pain Management:    Induction: Intravenous  Airway Management Planned: Simple Face Mask  Additional Equipment:   Intra-op Plan:   Post-operative Plan:   Informed Consent: I have reviewed the patients History and Physical, chart, labs and discussed the procedure including the risks, benefits and alternatives for the proposed anesthesia with the patient or authorized representative who has indicated his/her understanding and acceptance.   Dental advisory given  Plan Discussed with: CRNA  Anesthesia Plan Comments:         Anesthesia Quick Evaluation

## 2012-05-01 NOTE — Op Note (Signed)
Pre-operative diagnosis- Osteoarthritis  Right knee(s)  Post-operative diagnosis- Osteoarthritis Right knee(s)  Procedure-  Right  Total Knee Arthroplasty  Surgeon- Gus Rankin. Tanise Russman, MD  Assistant- Dimitri Ped, PA-C   Anesthesia-  Spinal EBL-* No blood loss amount entered *  Drains Hemovac  Tourniquet time-  Total Tourniquet Time Documented: Thigh (Right) - 42 minutes   Complications- None  Condition-PACU - hemodynamically stable.   Brief Clinical Note  TENECIA IGNASIAK is a 76 y.o. year old female with end stage OA of her right knee with progressively worsening pain and dysfunction. She has constant pain, with activity and at rest and significant functional deficits with difficulties even with ADLs. She has had extensive non-op management including analgesics, injections of cortisone and viscosupplements, and home exercise program, but remains in significant pain with significant dysfunction.Radiographs show bone on bone arthritis patellofemoral with grooving of the anterior cortex of the femur. She presents now for right Total Knee Arthroplasty.    Procedure in detail---   The patient is brought into the operating room and positioned supine on the operating table. After successful administration of  Spinal,   a tourniquet is placed high on the  Right thigh(s) and the lower extremity is prepped and draped in the usual sterile fashion. Time out is performed by the operating team and then the  Right lower extremity is wrapped in Esmarch, knee flexed and the tourniquet inflated to 300 mmHg.       A midline incision is made with a ten blade through the subcutaneous tissue to the level of the extensor mechanism. A fresh blade is used to make a medial parapatellar arthrotomy. Soft tissue over the proximal medial tibia is subperiosteally elevated to the joint line with a knife and into the semimembranosus bursa with a Cobb elevator. Soft tissue over the proximal lateral tibia is elevated with  attention being paid to avoiding the patellar tendon on the tibial tubercle. The patella is everted, knee flexed 90 degrees and the ACL and PCL are removed. Findings are bone on bone patellofemoral and medial with large patellar and medial tibial spurs.        The drill is used to create a starting hole in the distal femur and the canal is thoroughly irrigated with sterile saline to remove the fatty contents. The 5 degree Right  valgus alignment guide is placed into the femoral canal and the distal femoral cutting block is pinned to remove 10 mm off the distal femur. Resection is made with an oscillating saw.      The tibia is subluxed forward and the menisci are removed. The extramedullary alignment guide is placed referencing proximally at the medial aspect of the tibial tubercle and distally along the second metatarsal axis and tibial crest. The block is pinned to remove 2mm off the more deficient medial  side. Resection is made with an oscillating saw. Size 2.5is the most appropriate size for the tibia and the proximal tibia is prepared with the modular drill and keel punch for that size.      The femoral sizing guide is placed and size 2.5 is most appropriate. Rotation is marked off the epicondylar axis and confirmed by creating a rectangular flexion gap at 90 degrees. The size 2.5 cutting block is pinned in this rotation and the anterior, posterior and chamfer cuts are made with the oscillating saw. The intercondylar block is then placed and that cut is made.      Trial size 2.5 tibial component, trial  size 2.5 posterior stabilized femur and a 12.5  mm posterior stabilized rotating platform insert trial is placed. Full extension is achieved with excellent varus/valgus and anterior/posterior balance throughout full range of motion. The patella is everted and thickness measured to be 22  mm. Free hand resection is taken to 12 mm, a 35 template is placed, lug holes are drilled, trial patella is placed, and it  tracks normally. Osteophytes are removed off the posterior femur with the trial in place. All trials are removed and the cut bone surfaces prepared with pulsatile lavage. Cement is mixed and once ready for implantation, the size 2.5 tibial implant, size  2.5 posterior stabilized femoral component, and the size 35 patella are cemented in place and the patella is held with the clamp. The trial insert is placed and the knee held in full extension. All extruded cement is removed and once the cement is hard the permanent 12.5 mm posterior stabilized rotating platform insert is placed into the tibial tray.      The wound is copiously irrigated with saline solution and the extensor mechanism closed over a hemovac drain with #1 PDS suture. The tourniquet is released for a total tourniquet time of 43  minutes. Flexion against gravity is 140 degrees and the patella tracks normally. Subcutaneous tissue is closed with 2.0 vicryl and subcuticular with running 4.0 Monocryl. The catheter for the Marcaine pain pump is placed and the pump is initiated. The incision is cleaned and dried and steri-strips and a bulky sterile dressing are applied. The limb is placed into a knee immobilizer and the patient is awakened and transported to recovery in stable condition.      Please note that a surgical assistant was a medical necessity for this procedure in order to perform it in a safe and expeditious manner. Surgical assistant was necessary to retract the ligaments and vital neurovascular structures to prevent injury to them and also necessary for proper positioning of the limb to allow for anatomic placement of the prosthesis.   Gus Rankin Leigh Kaeding, MD    05/01/2012, 4:55 PM

## 2012-05-01 NOTE — Anesthesia Postprocedure Evaluation (Signed)
  Anesthesia Post-op Note  Patient: Casey Freeman  Procedure(s) Performed: Procedure(s) (LRB): TOTAL KNEE ARTHROPLASTY (Right)  Patient Location: PACU  Anesthesia Type: Spinal  Level of Consciousness: awake and alert   Airway and Oxygen Therapy: Patient Spontanous Breathing  Post-op Pain: mild  Post-op Assessment: Post-op Vital signs reviewed, Patient's Cardiovascular Status Stable, Respiratory Function Stable, Patent Airway and No signs of Nausea or vomiting  Post-op Vital Signs: stable  Complications: No apparent anesthesia complications

## 2012-05-01 NOTE — Plan of Care (Signed)
Problem: Consults Goal: Diagnosis- Total Joint Replacement Right total knee     

## 2012-05-01 NOTE — Anesthesia Procedure Notes (Signed)
Spinal Patient location during procedure: OR Staffing Anesthesiologist: Rufino Staup Performed by: anesthesiologist  Preanesthetic Checklist Completed: patient identified, site marked, surgical consent, pre-op evaluation, timeout performed, IV checked, risks and benefits discussed and monitors and equipment checked Spinal Block Patient position: sitting Prep: Betadine Patient monitoring: heart rate, continuous pulse ox and blood pressure Approach: midline Location: L3-4 Injection technique: single-shot Needle Needle type: Spinocan  Needle gauge: 22 G Needle length: 9 cm Additional Notes Expiration date of kit checked and confirmed. Patient tolerated procedure well, without complications.     

## 2012-05-02 ENCOUNTER — Encounter (HOSPITAL_COMMUNITY): Payer: Self-pay | Admitting: Orthopedic Surgery

## 2012-05-02 LAB — BASIC METABOLIC PANEL
CO2: 25 mEq/L (ref 19–32)
Calcium: 9 mg/dL (ref 8.4–10.5)
Creatinine, Ser: 0.59 mg/dL (ref 0.50–1.10)
GFR calc Af Amer: >90 mL/min (ref >90)

## 2012-05-02 LAB — CBC
MCH: 31.2 pg (ref 26.0–34.0)
MCV: 92.5 fL (ref 78.0–100.0)
Platelets: 207 10*3/uL (ref 150–400)
RDW: 12.7 % (ref 11.5–15.5)

## 2012-05-02 MED ORDER — HYDROMORPHONE HCL PF 1 MG/ML IJ SOLN: 0.5000 mg | INTRAMUSCULAR | Status: DC | PRN

## 2012-05-02 NOTE — Plan of Care (Signed)
Problem: Consults Goal: Diagnosis- Total Joint Replacement Outcome: Completed/Met Date Met:  05/02/12 Right Total Knee

## 2012-05-02 NOTE — Progress Notes (Signed)
Physical Therapy Treatment Patient Details Name: Casey Freeman MRN: [id number] DOB: 11-06-1935 Today's Date: 05/02/2012 Time: 1430-1500 PT Time Calculation (min): 30 min  PT Assessment / Plan / Recommendation Comments on Treatment Session  Continuing to progress.     Follow Up Recommendations  Home health PT    Barriers to Discharge        Equipment Recommendations  None recommended by PT    Recommendations for Other Services    Frequency 7X/week   Plan Discharge plan remains appropriate    Precautions / Restrictions Precautions Precautions: Fall;Knee Required Braces or Orthoses: Knee Immobilizer - Right Knee Immobilizer - Right: Discontinue once straight leg raise with < 10 degree lag Restrictions Weight Bearing Restrictions: No RLE Weight Bearing: Weight bearing as tolerated   Pertinent Vitals/Pain     Mobility  Bed Mobility Bed Mobility: Sit to Supine Sit to Supine: 4: Min assist;With rail Details for Bed Mobility Assistance: Assist for R LE onto bed. VCs safety, technique,hand placement.  Transfers Transfers: Sit to Stand;Stand to Sit Sit to Stand: 4: Min assist;With upper extremity assist;With armrests;From chair/3-in-1 Stand to Sit: 4: Min assist;With upper extremity assist;With armrests;To bed Details for Transfer Assistance: VCs safety, technique, hand placement. Assist to rise,stabilize, control descent.  Ambulation/Gait Ambulation/Gait Assistance: 4: Min assist Ambulation Distance (Feet): 35 Feet Assistive device: Rolling walker Ambulation/Gait Assistance Details: VCs safety, technique, handp lacement. Assist to stabilize throughout ambulation.  Gait Pattern: Step-to pattern;Decreased stride length;Decreased step length - right;Decreased step length - left;Antalgic    Exercises Total Joint Exercises Ankle Circles/Pumps: AROM;Both;10 reps;Supine Quad Sets: AROM;Strengthening;Both;10 reps;Supine Short Arc Quad: AAROM;Strengthening;Right;10  reps;Supine Heel Slides: AAROM;Strengthening;Right;10 reps;Supine Hip ABduction/ADduction: AAROM;Strengthening;Right;10 reps;Supine Straight Leg Raises: AAROM;Strengthening;Right;10 reps;Supine   PT Diagnosis:    PT Problem List:   PT Treatment Interventions:     PT Goals Acute Rehab PT Goals Pt will go Sit to Supine/Side: with supervision PT Goal: Sit to Supine/Side - Progress: Progressing toward goal Pt will go Sit to Stand: with supervision PT Goal: Sit to Stand - Progress: Progressing toward goal Pt will Ambulate: 51 - 150 feet;with supervision;with least restrictive assistive device PT Goal: Ambulate - Progress: Progressing toward goal  Visit Information  Last PT Received On: 05/02/12 Assistance Needed: +1    Subjective Data  Subjective: "I have to do this" Patient Stated Goal: Home   Cognition  Overall Cognitive Status: Appears within functional limits for tasks assessed/performed Arousal/Alertness: Awake/alert Orientation Level: Appears intact for tasks assessed Behavior During Session: [facility] for tasks performed    Balance     End of Session PT - End of Session Equipment Utilized During Treatment: Gait belt;Right knee immobilizer Activity Tolerance: Patient tolerated treatment well Patient left: in bed;with call bell/phone within reach;with family/visitor present   GP     Rebeca Alert [facility] 05/02/2012, 4:29 PM [phone]

## 2012-05-02 NOTE — Evaluation (Signed)
Physical Therapy Evaluation Patient Details Name: Casey Freeman MRN: [id number] DOB: [birth year] Today's Date: 05/02/2012 Time: 4098-1191 PT Time Calculation (min): 23 min  PT Assessment / Plan / Recommendation Clinical Impression  76 yo female s/p R TKA. Plans to d/c home with husband. Limited mobility on eval due to pt c/o feeling "woozy" and nauseous. Recommend HHPT.     PT Assessment  Patient needs continued PT services    Follow Up Recommendations  Home health PT    Barriers to Discharge        Equipment Recommendations  None recommended by PT    Recommendations for Other Services OT consult   Frequency 7X/week    Precautions / Restrictions Precautions Precautions: Fall;Knee Required Braces or Orthoses: Knee Immobilizer - Right Knee Immobilizer - Right: Discontinue once straight leg raise with < 10 degree lag Restrictions Weight Bearing Restrictions: No RLE Weight Bearing: Weight bearing as tolerated   Pertinent Vitals/Pain       Mobility  Bed Mobility Bed Mobility: Supine to Sit Supine to Sit: HOB elevated;With rails Details for Bed Mobility Assistance: VCs safety, technique,hand placement. assist for R LE off bed.  Transfers Transfers: Sit to Stand;Stand to [employer] Transfers Sit to Stand: 3: Mod assist;With upper extremity assist;From bed Stand to Sit: 4: Min assist;With upper extremity assist;With armrests;To chair/3-in-1 Stand Pivot Transfers: 4: Min assist Details for Transfer Assistance: VCs safety, technique, hand placement. assist to rise, stabilize, control descent. Stand pivot bed>recliner with RW. Increased time.  Ambulation/Gait Ambulation/Gait Assistance: Not tested (comment) Ambulation/Gait Assistance Details: Deferred due to pt c/o feeling "woozy". Will attempt later this pm after pt has been upright for a while.  Gait Pattern: Step-to pattern;Antalgic    Exercises     PT Diagnosis: Difficulty walking;Abnormality of gait;Acute pain    PT Problem List: Decreased strength;Decreased range of motion;Decreased activity tolerance;Decreased mobility;Pain;Decreased knowledge of use of DME PT Treatment Interventions: DME instruction;Gait training;Stair training;Functional mobility training;Therapeutic activities;Therapeutic exercise;Patient/family education   PT Goals Acute Rehab PT Goals PT Goal Formulation: With patient/family Time For Goal Achievement: 05/09/12 Potential to Achieve Goals: Good Pt will go Supine/Side to Sit: with supervision PT Goal: Supine/Side to Sit - Progress: Goal set today Pt will go Sit to Supine/Side: with supervision PT Goal: Sit to Supine/Side - Progress: Goal set today Pt will go Sit to Stand: with supervision PT Goal: Sit to Stand - Progress: Goal set today Pt will Ambulate: 51 - 150 feet;with supervision;with least restrictive assistive device PT Goal: Ambulate - Progress: Goal set today Pt will Go Up / Down Stairs: 3-5 stairs;with min assist;with least restrictive assistive device (3 steps) PT Goal: Up/Down Stairs - Progress: Goal set today  Visit Information  Last PT Received On: 05/02/12 Assistance Needed: +2 (safety)    Subjective Data  Subjective: "I feel "woozy"" Patient Stated Goal: Home   Prior Functioning  Home Living Lives With: Spouse Available Help at Discharge: Family Type of Home: House Home Access: Stairs to enter Secretary/administrator of Steps: 3 Entrance Stairs-Rails: Right Home Layout: One level Home Adaptive Equipment: Walker - rolling;Bedside commode/3-in-1;Straight cane;Shower chair with back Prior Function Able to Take Stairs?: Yes Communication Communication: No difficulties    Cognition  Overall Cognitive Status: Appears within functional limits for tasks assessed/performed Arousal/Alertness: Awake/alert Orientation Level: Appears intact for tasks assessed Behavior During Session: [facility] for tasks performed    Extremity/Trunk Assessment Right Lower  Extremity Assessment RLE ROM/Strength/Tone: Deficits RLE ROM/Strength/Tone Deficits: SLR 2/5. moves ankle well RLE Sensation:  WFL - Light Touch Left Lower Extremity Assessment LLE ROM/Strength/Tone: WFL for tasks assessed   Balance    End of Session PT - End of Session Equipment Utilized During Treatment: Gait belt;Right knee immobilizer Activity Tolerance: Patient tolerated treatment well Patient left: in chair;with call bell/phone within reach;with family/visitor present  GP     Rebeca Alert [facility] 05/02/2012, 11:37 AM [phone]

## 2012-05-03 DIAGNOSIS — E876 Hypokalemia: Secondary | ICD-10-CM

## 2012-05-03 LAB — BASIC METABOLIC PANEL
BUN: 8 mg/dL (ref 6–23)
Chloride: 101 mEq/L (ref 96–112)
GFR calc Af Amer: >90 mL/min (ref >90)
Potassium: 3.4 mEq/L — ABNORMAL LOW (ref 3.5–5.1)
Sodium: 136 mEq/L (ref 135–145)

## 2012-05-03 LAB — CBC
HCT: 34.3 % — ABNORMAL LOW (ref 36.0–46.0)
RDW: 12.8 % (ref 11.5–15.5)
WBC: 9.5 10*3/uL (ref 4.0–10.5)

## 2012-05-03 MED ORDER — POTASSIUM CHLORIDE CRYS ER 20 MEQ PO TBCR
40.0000 meq | EXTENDED_RELEASE_TABLET | Freq: Two times a day (BID) | ORAL | Status: AC
  Administered 2012-05-03 (×2): 40 meq via ORAL
  Filled 2012-05-03 (×2): qty 2

## 2012-05-03 NOTE — Progress Notes (Signed)
Late Entry: at 21:41 found on-q pump tubing out of right knee.

## 2012-05-03 NOTE — Progress Notes (Signed)
   Subjective: 2 Days Post-Op Procedure(s) (LRB): TOTAL KNEE ARTHROPLASTY (Right) Patient reports pain as mild.   Patient seen in rounds with Dr. Lequita Halt. Patient is well, and has had no acute complaints or problems Plan is to go Home after hospital stay.  Objective: Vital signs in last 24 hours: Temp:  [98.2 F (36.8 C)-98.6 F (37 C)] 98.2 F (36.8 C) (08/01 0644) Pulse Rate:  [64-102] 102  (08/01 0644) Resp:  [12-16] 16  (08/01 0644) BP: (124-189)/(78-83) 189/83 mmHg (08/01 0644) SpO2:  [93 %-99 %] 93 % (08/01 0644)  Intake/Output from previous day:  Intake/Output Summary (Last 24 hours) at 05/03/12 1002 Last data filed at 05/03/12 0900  Gross per 24 hour  Intake   1603 ml  Output   1625 ml  Net    -22 ml    Intake/Output this shift: Total I/O In: 240 [P.O.:240] Out: -   Labs:  Basename 05/03/12 0355 05/02/12 0343  HGB 11.6* 12.9    Basename 05/03/12 0355 05/02/12 0343  WBC 9.5 11.2*  RBC 3.76* 4.14  HCT 34.3* 38.3  PLT 200 207    Basename 05/03/12 0355 05/02/12 0343  NA 136 136  K 3.4* 3.7  CL 101 100  CO2 26 25  BUN 8 12  CREATININE 0.59 0.59  GLUCOSE 127* 188*  CALCIUM 8.5 9.0   No results found for this basename: LABPT:2,INR:2 in the last 72 hours  EXAM General - Patient is Alert, Appropriate and Oriented Extremity - Neurovascular intact Sensation intact distally Dorsiflexion/Plantar flexion intact Dressing/Incision - clean, dry, no drainage, healing Motor Function - intact, moving foot and toes well on exam.   Past Medical History  Diagnosis Date  . PONV (postoperative nausea and vomiting)     >20 YRS AGO - NO PROBLEM SINCE  . Hypertension   . Arthritis   . Hx: UTI (urinary tract infection)     Assessment/Plan: 2 Days Post-Op Procedure(s) (LRB): TOTAL KNEE ARTHROPLASTY (Right) Principal Problem:  *OA (osteoarthritis) of knee Active Problems:  Postop Hypokalemia   Advance diet Up with therapy Plan for discharge  tomorrow Discharge home with home health  DVT Prophylaxis - Xarelto Weight-Bearing as tolerated to right leg  Casey Freeman 05/03/2012, 10:02 AM

## 2012-05-03 NOTE — Progress Notes (Signed)
Physical Therapy Treatment Patient Details Name: Casey Freeman MRN: [id number] DOB: 12-05-1935 Today's Date: 05/03/2012 Time: 4098-1191 PT Time Calculation (min): 14 min  PT Assessment / Plan / Recommendation Comments on Treatment Session  Progressing well. Plans for d/c home tomorrow    Follow Up Recommendations  Home health PT    Barriers to Discharge        Equipment Recommendations  None recommended by PT    Recommendations for Other Services    Frequency 7X/week   Plan Discharge plan remains appropriate    Precautions / Restrictions Precautions Precautions: Fall;Knee Required Braces or Orthoses: Knee Immobilizer - Right Knee Immobilizer - Right: Discontinue once straight leg raise with < 10 degree lag Restrictions Weight Bearing Restrictions: No RLE Weight Bearing: Weight bearing as tolerated   Pertinent Vitals/Pain     Mobility  Bed Mobility Bed Mobility: Supine to Sit Supine to Sit: 4: Min assist;HOB elevated;With rails Details for Bed Mobility Assistance: Assist for R LE off bed. VCs safety, technique. Pt a little impulsive.  Transfers Transfers: Sit to Stand;Stand to Sit Sit to Stand: 4: Min guard;With upper extremity assist;From bed Stand to Sit: 4: Min guard;With upper extremity assist;To chair/3-in-1 Details for Transfer Assistance: VCs safety, technique, hand placement. Pt a little impulsive-stood before RW was placed in front of her.  Ambulation/Gait Ambulation/Gait Assistance: 4: Min assist Ambulation Distance (Feet): 85 Feet Assistive device: Rolling walker Ambulation/Gait Assistance Details: VCs safety, sequence. Assist to stabilize throughout ambulation. Moves quickly.  Gait Pattern: Step-to pattern;Antalgic;Decreased step length - right    Exercises     PT Diagnosis:    PT Problem List:   PT Treatment Interventions:     PT Goals Acute Rehab PT Goals Pt will go Supine/Side to Sit: with supervision PT Goal: Supine/Side to Sit - Progress:  Progressing toward goal Pt will go Sit to Stand: with supervision PT Goal: Sit to Stand - Progress: Progressing toward goal Pt will Ambulate: 51 - 150 feet;with supervision;with least restrictive assistive device PT Goal: Ambulate - Progress: Progressing toward goal  Visit Information  Last PT Received On: 05/03/12 Assistance Needed: +1    Subjective Data  Subjective: "I'm ready" Patient Stated Goal: Home tomorrow   Cognition  Overall Cognitive Status: Appears within functional limits for tasks assessed/performed Arousal/Alertness: Awake/alert Orientation Level: Appears intact for tasks assessed Behavior During Session: [facility] for tasks performed    Balance     End of Session PT - End of Session Equipment Utilized During Treatment: Gait belt;Right knee immobilizer Activity Tolerance: Patient tolerated treatment well Patient left: in chair;with call bell/phone within reach   GP     Casey Freeman 05/03/2012, 12:16 PM [phone]

## 2012-05-03 NOTE — Progress Notes (Signed)
  LATE ENTRY NOTE Date of Service of Visit - 05/02/2012  Subjective: 1 Days Post-Op Procedure(s) (LRB): TOTAL KNEE ARTHROPLASTY (Right) Patient reports pain as mild.   Patient seen in rounds for Dr. Lequita Halt. Patient is well, and has had no acute complaints or problems We will start therapy today.  Plan is to go Home after hospital stay.  Objective: Vital signs in last 24 hours: Temp:  [97.6 F (36.4 C)-98.6 F (37 C)] 98.2 F (36.8 C) (08/01 0644) Pulse Rate:  [64-102] 102  (08/01 0644) Resp:  [12-16] 16  (08/01 0644) BP: (124-189)/(75-83) 189/83 mmHg (08/01 0644) SpO2:  [93 %-100 %] 93 % (08/01 0644)  Intake/Output from previous day:  Intake/Output Summary (Last 24 hours) at 05/03/12 0944 Last data filed at 05/03/12 0900  Gross per 24 hour  Intake   2498 ml  Output   1625 ml  Net    873 ml    Intake/Output this shift: Total I/O In: 240 [P.O.:240] Out: -   Labs:  Basename 05/03/12 0355 05/02/12 0343  HGB 11.6* 12.9    Basename 05/03/12 0355 05/02/12 0343  WBC 9.5 11.2*  RBC 3.76* 4.14  HCT 34.3* 38.3  PLT 200 207    Basename 05/03/12 0355 05/02/12 0343  NA 136 136  K 3.4* 3.7  CL 101 100  CO2 26 25  BUN 8 12  CREATININE 0.59 0.59  GLUCOSE 127* 188*  CALCIUM 8.5 9.0   No results found for this basename: LABPT:2,INR:2 in the last 72 hours  EXAM General - Patient is Alert, Appropriate and Oriented Extremity - Neurovascular intact Sensation intact distally Dorsiflexion/Plantar flexion intact Dressing - dressing C/D/I Motor Function - intact, moving foot and toes well on exam.  Hemovac pulled without difficulty.  Past Medical History  Diagnosis Date  . PONV (postoperative nausea and vomiting)     >20 YRS AGO - NO PROBLEM SINCE  . Hypertension   . Arthritis   . Hx: UTI (urinary tract infection)     Assessment/Plan: 1 Days Post-Op Procedure(s) (LRB): TOTAL KNEE ARTHROPLASTY (Right) Principal Problem:  *OA (osteoarthritis) of knee Active  Problems:  Postop Hypokalemia   Advance diet Up with therapy Discharge home with home health  DVT Prophylaxis - Xarelto Weight-Bearing as tolerated to right leg No vaccines. D/C PCA Morphine, Change to IV push D/C O2 and Pulse OX and try on Room [address]  Patrica Duel 05/03/2012, 9:44 AM

## 2012-05-03 NOTE — Evaluation (Signed)
Occupational Therapy Evaluation Patient Details Name: Casey Freeman MRN: [id number] DOB: August 29, 1936 Today's Date: 05/03/2012 Time: 4098-1191 OT Time Calculation (min): 26 min  OT Assessment / Plan / Recommendation Clinical Impression  This 76 year old female was admitted for R TKA.  She has DME and husband's assistance for ADLs.  All education was completed.  Pt will not need any further OT at this time, but she will benefit from HHPT to review shower transfer in a couple of days.      OT Assessment  Patient does not need any further OT services    Follow Up Recommendations       Barriers to Discharge      Equipment Recommendations  None recommended by OT;None recommended by PT    Recommendations for Other Services    Frequency       Precautions / Restrictions Precautions Precautions: Fall;Knee Required Braces or Orthoses: Knee Immobilizer - Right Knee Immobilizer - Right: Discontinue once straight leg raise with < 10 degree lag Restrictions RLE Weight Bearing: Weight bearing as tolerated   Pertinent Vitals/Pain 4 R knee; repositioned and ice applied    ADL  Grooming: Performed;Wash/dry hands;Supervision/safety Where Assessed - Grooming: Supported standing Upper Body Bathing: Simulated;Set up Where Assessed - Upper Body Bathing: Unsupported sitting Lower Body Bathing: Simulated;Minimal assistance Where Assessed - Lower Body Bathing: Supported sit to stand Upper Body Dressing: Simulated;Set up Where Assessed - Upper Body Dressing: Unsupported sitting Lower Body Dressing: Simulated;Moderate assistance Where Assessed - Lower Body Dressing: Supported sit to stand Toilet Transfer: Performed;Min Pension scheme manager Method: Sit to stand (min cues for RLE placement) Acupuncturist: Materials engineer and Hygiene: Performed;Supervision/safety Where Assessed - Engineer, mining and Hygiene: Sit to stand from 3-in-1 or  toilet Tub/Shower Transfer:  (attempted:  unable at this time) Equipment Used: Knee Immobilizer;Rolling walker Transfers/Ambulation Related to ADLs: min guard ambulating to bathroom ADL Comments: attempted to do shower transfer but pt unable to lift LLE into shower:  too much pain.  Educated on sequence.  She will practice a "dry run" with HHPT.  Husband will assist with ADLS.  Pt has reacher:  discussed ADL uses.    OT Diagnosis:    OT Problem List:   OT Treatment Interventions:     OT Goals    Visit Information  Last OT Received On: 05/03/12    Subjective Data  Subjective: "i was thinking i'd just do a bird bath at first" Patient Stated Goal: agreeable to OT   Prior Functioning  Vision/Perception  Home Living Lives With: Spouse Bathroom Shower/Tub: Walk-in Stage manager: Standard (3:1) Home Adaptive Equipment: Walker - rolling;Bedside commode/3-in-1;Straight cane;Shower chair with back Prior Function Level of Independence: Independent with assistive device(s) Communication Communication: No difficulties Dominant Hand: Right      Cognition  Overall Cognitive Status: Appears within functional limits for tasks assessed/performed Behavior During Session: [facility] for tasks performed    Extremity/Trunk Assessment Right Upper Extremity Assessment RUE ROM/Strength/Tone: [facility] for tasks assessed Left Upper Extremity Assessment LUE ROM/Strength/Tone: WFL for tasks assessed   Mobility Bed Mobility Supine to Sit: 4: Min assist Transfers Sit to Stand: 5: Supervision   Exercise    Balance    End of Session OT - End of Session Activity Tolerance: Patient tolerated treatment well Patient left: in chair;with call bell/phone within reach  GO     Casey Freeman 05/03/2012, 10:02 AM Marica Otter, OTR/L [phone] 05/03/2012

## 2012-05-03 NOTE — Progress Notes (Signed)
Physical Therapy Treatment Patient Details Name: Casey Freeman MRN: [id number] DOB: 01/08/1936 Today's Date: 05/03/2012 Time: 4098-1191 PT Time Calculation (min): 28 min  PT Assessment / Plan / Recommendation Comments on Treatment Session  Continuing to progress well. Plan is for d/c home tomorrow. Needs to practice steps.     Follow Up Recommendations  Home health PT    Barriers to Discharge        Equipment Recommendations  None recommended by PT    Recommendations for Other Services    Frequency 7X/week   Plan Discharge plan remains appropriate    Precautions / Restrictions Precautions Precautions: Fall;Knee Required Braces or Orthoses: Knee Immobilizer - Right Knee Immobilizer - Right: Discontinue once straight leg raise with < 10 degree lag Restrictions Weight Bearing Restrictions: No RLE Weight Bearing: Weight bearing as tolerated   Pertinent Vitals/Pain     Mobility  Bed Mobility Bed Mobility: Sit to Supine;Supine to Sit Supine to Sit: 4: Min assist Sit to Supine: 4: Min assist Details for Bed Mobility Assistance: Assist for R LE onto/off bed. Transfers Transfers: Sit to Stand;Stand to Sit Sit to Stand: 4: Min guard;With upper extremity assist;From bed Stand to Sit: 4: Min guard;With upper extremity assist;To bed Details for Transfer Assistance: VCs safety, technique, hand placement.  Ambulation/Gait Ambulation/Gait Assistance: 4: Min assist Ambulation Distance (Feet): 125 Feet Assistive device: Rolling walker Ambulation/Gait Assistance Details: VCs safety, sequence, pace. Assit to stabilize intermittently.  Gait Pattern: Step-to pattern;Decreased stride length;Decreased step length - right;Decreased step length - left;Antalgic    Exercises Total Joint Exercises Ankle Circles/Pumps: AROM;Both;10 reps;Supine Quad Sets: AROM;Strengthening;Both;Supine Short Arc Quad: AROM;Strengthening;Right;10 reps;Supine Heel Slides: AAROM;Strengthening;Right;10  reps;Supine Hip ABduction/ADduction: AAROM;Strengthening;Right;10 reps;Supine Straight Leg Raises: AAROM;Strengthening;10 reps;Supine;Right   PT Diagnosis:    PT Problem List:   PT Treatment Interventions:     PT Goals Acute Rehab PT Goals Pt will go Supine/Side to Sit: with supervision PT Goal: Supine/Side to Sit - Progress: Progressing toward goal Pt will go Sit to Supine/Side: with supervision PT Goal: Sit to Supine/Side - Progress: Progressing toward goal Pt will go Sit to Stand: with supervision PT Goal: Sit to Stand - Progress: Progressing toward goal Pt will Ambulate: 51 - 150 feet;with supervision;with least restrictive assistive device PT Goal: Ambulate - Progress: Progressing toward goal  Visit Information  Last PT Received On: 05/03/12 Assistance Needed: +1    Subjective Data  Subjective: "I'm leaving tomorrow" Patient Stated Goal: Home   Cognition  Overall Cognitive Status: Appears within functional limits for tasks assessed/performed Arousal/Alertness: Awake/alert Orientation Level: Appears intact for tasks assessed Behavior During Session: [facility] for tasks performed    Balance     End of Session PT - End of Session Equipment Utilized During Treatment: Gait belt;Right knee immobilizer Activity Tolerance: Patient tolerated treatment well Patient left: in bed;with call bell/phone within reach   GP     Rebeca Alert [facility] 05/03/2012, 2:53 PM [phone]

## 2012-05-04 LAB — CBC
HCT: 32.5 % — ABNORMAL LOW (ref 36.0–46.0)
Hemoglobin: 10.7 g/dL — ABNORMAL LOW (ref 12.0–15.0)
MCHC: 32.9 g/dL (ref 30.0–36.0)
RBC: 3.51 MIL/uL — ABNORMAL LOW (ref 3.87–5.11)
WBC: 8.4 10*3/uL (ref 4.0–10.5)

## 2012-05-04 MED ORDER — OXYCODONE HCL 5 MG PO TABS: 5.0000 mg | ORAL_TABLET | ORAL | Status: AC | PRN

## 2012-05-04 MED ORDER — METHOCARBAMOL 500 MG PO TABS: 500.0000 mg | ORAL_TABLET | Freq: Four times a day (QID) | ORAL | Status: AC | PRN

## 2012-05-04 MED ORDER — RIVAROXABAN 10 MG PO TABS: 10.0000 mg | ORAL_TABLET | Freq: Every day | ORAL | Status: DC

## 2012-05-04 NOTE — Progress Notes (Signed)
   Subjective: 3 Days Post-Op Procedure(s) (LRB): TOTAL KNEE ARTHROPLASTY (Right) Patient reports pain as mild.   Patient seen in rounds with Dr. Lequita Halt. Patient is well, and has had no acute complaints or problems Patient is ready to go home today.  Objective: Vital signs in last 24 hours: Temp:  [98 F (36.7 C)-98.3 F (36.8 C)] 98.1 F (36.7 C) (08/02 0630) Pulse Rate:  [83-95] 95  (08/02 0630) Resp:  [14-16] 16  (08/02 0630) BP: (128-156)/(66-81) 128/66 mmHg (08/02 0630) SpO2:  [92 %-95 %] 94 % (08/02 0630)  Intake/Output from previous day:  Intake/Output Summary (Last 24 hours) at 05/04/12 0911 Last data filed at 05/04/12 0738  Gross per 24 hour  Intake    960 ml  Output      0 ml  Net    960 ml    Intake/Output this shift: Total I/O In: 240 [P.O.:240] Out: -   Labs:  Basename 05/04/12 0402 05/03/12 0355 05/02/12 0343  HGB 10.7* 11.6* 12.9    Basename 05/04/12 0402 05/03/12 0355  WBC 8.4 9.5  RBC 3.51* 3.76*  HCT 32.5* 34.3*  PLT 212 200    Basename 05/03/12 0355 05/02/12 0343  NA 136 136  K 3.4* 3.7  CL 101 100  CO2 26 25  BUN 8 12  CREATININE 0.59 0.59  GLUCOSE 127* 188*  CALCIUM 8.5 9.0   No results found for this basename: LABPT:2,INR:2 in the last 72 hours  EXAM: General - Patient is Alert, Appropriate and Oriented Extremity - Neurovascular intact Sensation intact distally Dorsiflexion/Plantar flexion intact No cellulitis present Incision - clean, dry, no drainage, healing Motor Function - intact, moving foot and toes well on exam.   Assessment/Plan: 3 Days Post-Op Procedure(s) (LRB): TOTAL KNEE ARTHROPLASTY (Right) Procedure(s) (LRB): TOTAL KNEE ARTHROPLASTY (Right) Past Medical History  Diagnosis Date  . PONV (postoperative nausea and vomiting)     >20 YRS AGO - NO PROBLEM SINCE  . Hypertension   . Arthritis   . Hx: UTI (urinary tract infection)    Principal Problem:  *OA (osteoarthritis) of knee Active Problems:  Postop  Hypokalemia   Discharge home with home health Diet - Cardiac diet Follow up - in 2 weeks Activity - WBAT Disposition - Home Condition Upon Discharge - Good D/C Meds - See DC Summary DVT Prophylaxis - Xarelto  Brycelynn Stampley 05/04/2012, 9:11 AM

## 2012-05-04 NOTE — Care Management Note (Signed)
    Page 1 of 2   05/04/2012     1:29:38 PM   CARE MANAGEMENT NOTE 05/04/2012  Patient:  Casey Freeman, Casey Freeman   Account Number:  [id number]  Date Initiated:  05/04/2012  Documentation initiated by:  Colleen Can  Subjective/Objective Assessment:   dx osteoarthritis righjt knee; total knee replacemnt     Action/Plan:   CM spoke with patient. Plans are for patient to return to her home in [place] where spouse will be caregiver. She already has RW, cane, and commode seat. She wants In network hh agency   Anticipated DC Date:  05/04/2012   Anticipated DC Plan:  HOME W HOME HEALTH SERVICES  In-house referral  Clinical Social Worker      DC Associate Professor  CM consult      [facility] Choice  HOME HEALTH   Choice offered to / List presented to:  C-1 Patient   DME arranged  NA      DME agency  NA     HH arranged  HH-2 PT      HH agency  [facility]   Status of service:  Completed, signed off Medicare Important Message given?  NA - LOS <3 / Initial given by admissions (If response is "NO", the following Medicare IM given date fields will be blank) Date Medicare IM given:   Date Additional Medicare IM given:    Discharge Disposition:  HOME W HOME HEALTH SERVICES  Per UR Regulation:    If discussed at Long Length of Stay Meetings, dates discussed:    Comments:  05/04/2012 [name] BSN CCM [phone] [facility] can provide [facility] services. Faxed HH ordrs, op note, h&p, face sheet to [phone] , confirmation received. Services to start within 48hrs of discharge.

## 2012-05-04 NOTE — Progress Notes (Signed)
Physical Therapy Treatment Patient Details Name: Casey Freeman MRN: [id number] DOB: Jul 14, 1936 Today's Date: 05/04/2012 Time: 9562-1308 PT Time Calculation (min): 21 min  PT Assessment / Plan / Recommendation Comments on Treatment Session  Pt doing well with ambulation and stair training.  Ready for D/c.     Follow Up Recommendations  Home health PT    Barriers to Discharge        Equipment Recommendations  None recommended by PT    Recommendations for Other Services    Frequency 7X/week   Plan Discharge plan remains appropriate    Precautions / Restrictions Precautions Precautions: Fall;Knee Required Braces or Orthoses: Knee Immobilizer - Right Knee Immobilizer - Right: Discontinue once straight leg raise with < 10 degree lag Restrictions Weight Bearing Restrictions: No RLE Weight Bearing: Weight bearing as tolerated   Pertinent Vitals/Pain 2/10    Mobility  Bed Mobility Bed Mobility: Supine to Sit Supine to Sit: 4: Min assist Details for Bed Mobility Assistance: Assist for RLE out of bed.  Pt demos good UE technique getting to EOB.   Transfers Transfers: Sit to Stand;Stand to Sit Sit to Stand: 5: Supervision;From elevated surface;With upper extremity assist;From bed Stand to Sit: 5: Supervision;With upper extremity assist;With armrests;To chair/3-in-1 Details for Transfer Assistance: Supervision and cues for safety and hand placement, LE management.  Ambulation/Gait Ambulation/Gait Assistance: 4: Min guard Ambulation Distance (Feet): 200 Feet Assistive device: Rolling walker Ambulation/Gait Assistance Details: Cues for equal step length, relaxed and upright posture.  Gait Pattern: Step-to pattern;Decreased stride length;Decreased step length - right;Decreased step length - left;Antalgic Gait velocity: decreased Stairs: Yes Stairs Assistance: 4: Min guard Stair Management Technique: No rails;Backwards;Forwards;With walker;Step to pattern Number of Stairs: 4       Exercises     PT Diagnosis:    PT Problem List:   PT Treatment Interventions:     PT Goals Acute Rehab PT Goals PT Goal Formulation: With patient/family Time For Goal Achievement: 05/09/12 Potential to Achieve Goals: Good Pt will go Supine/Side to Sit: with supervision PT Goal: Supine/Side to Sit - Progress: Progressing toward goal Pt will go Sit to Supine/Side: with supervision PT Goal: Sit to Supine/Side - Progress: Progressing toward goal Pt will go Sit to Stand: with supervision PT Goal: Sit to Stand - Progress: Met Pt will Ambulate: 51 - 150 feet;with supervision;with least restrictive assistive device PT Goal: Ambulate - Progress: Progressing toward goal Pt will Go Up / Down Stairs: 3-5 stairs;with min assist;with least restrictive assistive device PT Goal: Up/Down Stairs - Progress: Met  Visit Information  Last PT Received On: 05/04/12 Assistance Needed: +1    Subjective Data  Subjective: I'm ready to go home.   Patient Stated Goal: Home   Cognition  Overall Cognitive Status: Appears within functional limits for tasks assessed/performed Arousal/Alertness: Awake/alert Orientation Level: Appears intact for tasks assessed Behavior During Session: [facility] for tasks performed    Balance     End of Session PT - End of Session Equipment Utilized During Treatment: Right knee immobilizer Activity Tolerance: Patient tolerated treatment well Patient left: in bed;with call bell/phone within reach Nurse Communication: Mobility status   GP     Page, Meribeth Mattes 05/04/2012, 10:46 AM

## 2012-05-04 NOTE — Discharge Summary (Signed)
Physician Discharge Summary   Patient ID: Casey Freeman MRN: [id number] DOB/AGE: December 29, 1935 76 y.o.  Admit date: 05/01/2012 Discharge date: 05/04/2012  Primary Diagnosis: Osteoarthritis Right knee   Admission Diagnoses:  Past Medical History  Diagnosis Date  . PONV (postoperative nausea and vomiting)     >20 YRS AGO - NO PROBLEM SINCE  . Hypertension   . Arthritis   . Hx: UTI (urinary tract infection)    Discharge Diagnoses:   Principal Problem:  *OA (osteoarthritis) of knee Active Problems:  Postop Hypokalemia  Procedure:  Procedure(s) (LRB): TOTAL KNEE ARTHROPLASTY (Right)   Consults: None  HPI: Casey Freeman is a 76 y.o. year old female with end stage OA of her right knee with progressively worsening pain and dysfunction. She has constant pain, with activity and at rest and significant functional deficits with difficulties even with ADLs. She has had extensive non-op management including analgesics, injections of cortisone and viscosupplements, and home exercise program, but remains in significant pain with significant dysfunction.Radiographs show bone on bone arthritis patellofemoral with grooving of the anterior cortex of the femur. She presents now for right Total Knee Arthroplasty.      Laboratory Data: Hospital Outpatient Visit on 04/23/2012  Component Date Value Range Status  . aPTT 04/23/2012 33  24 - 37 seconds Final  . WBC 04/23/2012 6.7  4.0 - 10.5 K/uL Final  . RBC 04/23/2012 4.50  3.87 - 5.11 MIL/uL Final  . Hemoglobin 04/23/2012 14.0  12.0 - 15.0 g/dL Final  . HCT 40/98/1191 41.2  36.0 - 46.0 % Final  . MCV 04/23/2012 91.6  78.0 - 100.0 fL Final  . MCH 04/23/2012 31.1  26.0 - 34.0 pg Final  . MCHC 04/23/2012 34.0  30.0 - 36.0 g/dL Final  . RDW 47/82/9562 12.8  11.5 - 15.5 % Final  . Platelets 04/23/2012 247  150 - 400 K/uL Final  . Sodium 04/23/2012 136  135 - 145 mEq/L Final  . Potassium 04/23/2012 4.0  3.5 - 5.1 mEq/L Final  . Chloride 04/23/2012  99  96 - 112 mEq/L Final  . CO2 04/23/2012 27  19 - 32 mEq/L Final  . Glucose, Bld 04/23/2012 100* 70 - 99 mg/dL Final  . BUN 13/05/6577 15  6 - 23 mg/dL Final  . Creatinine, Ser 04/23/2012 0.58  0.50 - 1.10 mg/dL Final  . Calcium 46/96/2952 9.7  8.4 - 10.5 mg/dL Final  . Total Protein 04/23/2012 7.4  6.0 - 8.3 g/dL Final  . Albumin 84/13/2440 4.0  3.5 - 5.2 g/dL Final  . AST 07/30/2535 16  0 - 37 U/L Final  . ALT 04/23/2012 9  0 - 35 U/L Final  . Alkaline Phosphatase 04/23/2012 72  39 - 117 U/L Final  . Total Bilirubin 04/23/2012 0.5  0.3 - 1.2 mg/dL Final  . GFR calc non Af Amer 04/23/2012 89* >90 mL/min Final  . GFR calc Af Amer 04/23/2012 >90  >90 mL/min Final   Comment:                                 The eGFR has been calculated                          using the CKD EPI equation.  This calculation has not been                          validated in all clinical                          situations.                          eGFR's persistently                          <90 mL/min signify                          possible Chronic Kidney Disease.  Marland Kitchen Prothrombin Time 04/23/2012 13.5  11.6 - 15.2 seconds Final  . INR 04/23/2012 1.01  0.00 - 1.49 Final  . Color, Urine 04/23/2012 YELLOW  YELLOW Final  . APPearance 04/23/2012 CLEAR  CLEAR Final  . Specific Gravity, Urine 04/23/2012 1.014  1.005 - 1.030 Final  . pH 04/23/2012 7.0  5.0 - 8.0 Final  . Glucose, UA 04/23/2012 NEGATIVE  NEGATIVE mg/dL Final  . Hgb urine dipstick 04/23/2012 NEGATIVE  NEGATIVE Final  . Bilirubin Urine 04/23/2012 NEGATIVE  NEGATIVE Final  . Ketones, ur 04/23/2012 NEGATIVE  NEGATIVE mg/dL Final  . Protein, ur 16/07/9603 NEGATIVE  NEGATIVE mg/dL Final  . Urobilinogen, UA 04/23/2012 0.2  0.0 - 1.0 mg/dL Final  . Nitrite 54/06/8118 NEGATIVE  NEGATIVE Final  . Leukocytes, UA 04/23/2012 NEGATIVE  NEGATIVE Final   MICROSCOPIC NOT DONE ON URINES WITH NEGATIVE PROTEIN, BLOOD, LEUKOCYTES,  NITRITE, OR GLUCOSE <1000 mg/dL.  Marland Kitchen MRSA, PCR 04/23/2012 NEGATIVE  NEGATIVE Final  . Staphylococcus aureus 04/23/2012 NEGATIVE  NEGATIVE Final   Comment:                                 The Xpert SA Assay (FDA                          approved for NASAL specimens                          only), is one component of                          a comprehensive surveillance                          program.  It is not intended                          to diagnose infection nor to                          guide or monitor treatment.    Basename 05/04/12 0402 05/03/12 0355 05/02/12 0343  HGB 10.7* 11.6* 12.9    Basename 05/04/12 0402 05/03/12 0355  WBC 8.4 9.5  RBC 3.51* 3.76*  HCT 32.5* 34.3*  PLT 212 200    Basename 05/03/12 0355 05/02/12 0343  NA 136 136  K 3.4* 3.7  CL 101 100  CO2 26 25  BUN 8 12  CREATININE 0.59 0.59  GLUCOSE 127* 188*  CALCIUM 8.5 9.0   No results found for this basename: LABPT:2,INR:2 in the last 72 hours  X-Rays:Dg Chest 2 View  04/23/2012  *RADIOLOGY REPORT*  Clinical Data: Preop for right knee replacement  CHEST - 2 VIEW  Comparison: 07/27/2009  Findings: Cardiomediastinal silhouette is stable.  No acute infiltrate or pleural effusion.  No pulmonary edema.  Mild degenerative changes thoracic spine.  IMPRESSION: No active disease.  Mild degenerative changes thoracic spine.  Original Report Authenticated By: Natasha Mead, M.D.    EKG: Orders placed during the hospital encounter of 04/23/12  . EKG 12-LEAD  . EKG 12-LEAD     Hospital Course: Patient was admitted to [facility] and taken to the OR and underwent the above state procedure without complications.  Patient tolerated the procedure well and was later transferred to the recovery room and then to the orthopaedic floor for postoperative care.  They were given PO and IV analgesics for pain control following their surgery.  They were given 24 hours of postoperative antibiotics and started on DVT  prophylaxis in the form of Xarelto.   PT and OT were ordered for total joint protocol.  Discharge planning consulted to help with postop disposition and equipment needs.  Patient had a decent night on the evening of surgery and started to get up OOB with therapy on day one.  PCA Morphine was discontinued and they were weaned over to PO meds.  Hemovac drain was pulled without difficulty.  Continued to work with therapy into day two.  Dressing was changed on day two and the incision was healing well.  By day three, the patient had progressed with therapy and meeting their goals.  Incision was healing well.  Patient was seen in rounds and was ready to go home.  Discharge Medications: Prior to Admission medications   Medication Sig Start Date End Date Taking? Authorizing Provider  lisinopril-hydrochlorothiazide (PRINZIDE,ZESTORETIC) 20-25 MG per tablet Take 1 tablet by mouth every morning.   Yes Historical Provider, MD  LORazepam (ATIVAN) 0.5 MG tablet Take 0.5 mg by mouth 2 (two) times daily.   Yes Historical Provider, MD  methocarbamol (ROBAXIN) 500 MG tablet Take 1 tablet (500 mg total) by mouth every 6 (six) hours as needed. 05/04/12 05/14/12  Alexzandrew Perkins, PA  oxyCODONE (OXY IR/ROXICODONE) 5 MG immediate release tablet Take 1-2 tablets (5-10 mg total) by mouth every 4 (four) hours as needed for pain. 05/04/12 05/14/12  Alexzandrew Julien Girt, PA  rivaroxaban (XARELTO) 10 MG TABS tablet Take 1 tablet (10 mg total) by mouth daily with breakfast. Take Xarelto for two and a half more weeks, then discontinue Xarelto. Once the patient has completed the Xarelto, they may resume the 81 mg Aspirin. 05/04/12   Alexzandrew Julien Girt, PA    Diet: Cardiac diet Activity:WBAT Follow-up:in 2 weeks Disposition - Home Discharged Condition: good   Discharge Orders    Future Orders Please Complete By Expires   Diet - low sodium heart healthy      Diet Carb Modified      Call MD / Call 911      Comments:   If you  experience chest pain or shortness of breath, CALL 911 and be transported to the hospital emergency room.  If you develope a fever above 101 F, pus (white drainage) or increased drainage or redness at the wound, or calf pain, call your surgeon's office.   Discharge instructions  Comments:   Pick up stool softner and laxative for home. Do not submerge incision under water. May shower. Continue to use ice for pain and swelling from surgery.  Take Xarelto for two and a half more weeks, then discontinue Xarelto. Once the patient has completed the Xarelto, they may resume the 81 mg Aspirin.   Constipation Prevention      Comments:   Drink plenty of fluids.  Prune juice may be helpful.  You may use a stool softener, such as Colace (over the counter) 100 mg twice a day.  Use MiraLax (over the counter) for constipation as needed.   Increase activity slowly as tolerated      Patient may shower      Comments:   You may shower without a dressing once there is no drainage.  Do not wash over the wound.  If drainage remains, do not shower until drainage stops.   Driving restrictions      Comments:   No driving until released by the physician.   Lifting restrictions      Comments:   No lifting until released by the physician.   TED hose      Comments:   Use stockings (TED hose) for 3 weeks on both leg(s).  You may remove them at night for sleeping.   Change dressing      Comments:   Change dressing daily with sterile 4 x 4 inch gauze dressing and apply TED hose. Do not submerge the incision under water.   Do not put a pillow under the knee. Place it under the heel.      Do not sit on low chairs, stoools or toilet seats, as it may be difficult to get up from low surfaces        Medication List  As of 05/04/2012  9:16 AM   STOP taking these medications         aspirin 81 MG chewable tablet      cholecalciferol 1000 UNITS tablet      conjugated estrogens vaginal cream      CRANBERRY PLUS  VITAMIN C 4200-20-3 MG-MG-UNIT Caps      estradiol 0.05 MG/24HR      Magnesium 250 MG Tabs         TAKE these medications         lisinopril-hydrochlorothiazide 20-25 MG per tablet   Commonly known as: PRINZIDE,ZESTORETIC   Take 1 tablet by mouth every morning.      LORazepam 0.5 MG tablet   Commonly known as: ATIVAN   Take 0.5 mg by mouth 2 (two) times daily.      methocarbamol 500 MG tablet   Commonly known as: ROBAXIN   Take 1 tablet (500 mg total) by mouth every 6 (six) hours as needed.      oxyCODONE 5 MG immediate release tablet   Commonly known as: Oxy IR/ROXICODONE   Take 1-2 tablets (5-10 mg total) by mouth every 4 (four) hours as needed for pain.      rivaroxaban 10 MG Tabs tablet   Commonly known as: XARELTO   Take 1 tablet (10 mg total) by mouth daily with breakfast. Take Xarelto for two and a half more weeks, then discontinue Xarelto.  Once the patient has completed the Xarelto, they may resume the 81 mg Aspirin.           Follow-up Information    Follow up with Loanne Drilling, MD. Schedule an appointment as soon as possible  for a visit in 2 weeks.   Contact information:   [facility] [address] [place] [address] [phone]          Signed: Patrica Duel 05/04/2012, 9:16 AM

## 2012-05-29 ENCOUNTER — Other Ambulatory Visit: Payer: Self-pay | Admitting: Family Medicine

## 2012-05-29 DIAGNOSIS — Z1231 Encounter for screening mammogram for malignant neoplasm of breast: Secondary | ICD-10-CM

## 2012-06-20 ENCOUNTER — Ambulatory Visit
Admission: RE | Admit: 2012-06-20 | Discharge: 2012-06-20 | Disposition: A | Discharge status: Home / Self Care | Payer: Medicare Other | Source: Ambulatory Visit | Attending: Family Medicine | Admitting: Family Medicine

## 2012-06-20 DIAGNOSIS — Z1231 Encounter for screening mammogram for malignant neoplasm of breast: Secondary | ICD-10-CM

## 2013-05-20 ENCOUNTER — Other Ambulatory Visit: Payer: Self-pay

## 2013-05-20 DIAGNOSIS — Z1231 Encounter for screening mammogram for malignant neoplasm of breast: Secondary | ICD-10-CM

## 2013-06-21 ENCOUNTER — Ambulatory Visit
Admission: RE | Admit: 2013-06-21 | Discharge: 2013-06-21 | Disposition: A | Discharge status: Home / Self Care | Payer: Medicare Other | Source: Ambulatory Visit

## 2013-06-21 DIAGNOSIS — Z1231 Encounter for screening mammogram for malignant neoplasm of breast: Secondary | ICD-10-CM

## 2013-09-18 ENCOUNTER — Ambulatory Visit
Admission: RE | Admit: 2013-09-18 | Discharge: 2013-09-18 | Disposition: A | Discharge status: Home / Self Care | Payer: Medicare Other | Source: Ambulatory Visit | Attending: Family Medicine | Admitting: Family Medicine

## 2013-09-18 ENCOUNTER — Other Ambulatory Visit: Payer: Self-pay | Admitting: Family Medicine

## 2013-09-18 DIAGNOSIS — J40 Bronchitis, not specified as acute or chronic: Secondary | ICD-10-CM

## 2013-10-04 ENCOUNTER — Encounter: Payer: Self-pay | Admitting: Internal Medicine

## 2013-10-04 ENCOUNTER — Ambulatory Visit (INDEPENDENT_AMBULATORY_CARE_PROVIDER_SITE_OTHER): Payer: Medicare Other | Admitting: Internal Medicine

## 2013-10-04 VITALS — BP 130/84 | HR 81 | Temp 97.9°F | Ht 62.0 in | Wt 188.2 lb

## 2013-10-04 DIAGNOSIS — I10 Essential (primary) hypertension: Secondary | ICD-10-CM

## 2013-10-04 DIAGNOSIS — R059 Cough, unspecified: Secondary | ICD-10-CM | POA: Insufficient documentation

## 2013-10-04 DIAGNOSIS — R05 Cough: Secondary | ICD-10-CM | POA: Insufficient documentation

## 2013-10-04 MED ORDER — PREDNISONE 10 MG PO TABS: ORAL_TABLET | ORAL | Status: DC

## 2013-10-04 MED ORDER — OLMESARTAN MEDOXOMIL-HCTZ 20-12.5 MG PO TABS
1.0000 | ORAL_TABLET | Freq: Every day | ORAL | Status: DC
Start: 2013-10-04 — End: 2013-11-05

## 2013-10-04 NOTE — Patient Instructions (Addendum)
Stop linsinopril  benicar 20/12.5 one daily   Try prilosec 20mg   Take 30-60 min before first meal of the day and Pepcid 20 mg one bedtime until cough is completely gone for at least a week without the need for cough suppression  Best cough medication is delsym  GERD (REFLUX)  is an extremely common cause of respiratory symptoms, many times with no significant heartburn at all.    It can be treated with medication, but also with lifestyle changes including avoidance of late meals, excessive alcohol, smoking cessation, and avoid fatty foods, chocolate, peppermint, colas, red wine, and acidic juices such as orange juice.  NO MINT OR MENTHOL PRODUCTS SO NO COUGH DROPS  USE SUGARLESS CANDY INSTEAD (jolley ranchers or Stover's)  NO OIL BASED VITAMINS - use powdered substitutes.  Prednisone 10 mg take  4 each am x 2 days,   2 each am x 2 days,  1 each am x 2 days and stop   Only use your albuterol as a rescue medication to be used if you can't catch your breath by resting or doing a relaxed purse lip breathing pattern.  - The less you use it, the better it will work when you need it. - Ok to use up to 2 puffs every 4 hours if you must but call for immediate appointment if use goes up over your usual need - Don't leave home without it !!  (think of it like your spare tire for your car)      Please schedule a follow up office visit in 4 weeks, sooner if needed with pfts

## 2013-10-04 NOTE — Progress Notes (Signed)
   Subjective:    Patient ID: Casey Freeman, female    DOB: 19-Sep-1936  MRN: [id number]  HPI  [age] yowf never smoker with new onset sob and cough intially dx as bronchitis late2013  rx zpak and cough syrup and better but  Then newly  started on prn albuterol rarely needed until Aug 2014 referred 10/04/2013 by Dr Tiburcio PeaHarris for progressively worse despite adding symbicort   10/04/2013 1st [facility] office visit/ Stephen Baruch cc daily sob/ cough x 4-5  m assoc with variable sob to point where has trouble sometimes with adls and even sometimes sob at rest if coughing assoc with hoarseness, dry cough and subwheeze day > night.  No obvious pattern in  day to day or daytime variabilty or assoc chronic cough or cp or chest tightness, subjective wheeze overt sinus or hb symptoms. No unusual exp hx or h/o childhood pna/ asthma or knowledge of premature birth.  Sleeping ok without nocturnal  or early am exacerbation  of respiratory  c/o's or need for noct saba. Also denies any obvious fluctuation of symptoms with weather or environmental changes or other aggravating or alleviating factors except as outlined above   Current Medications, Allergies, Complete Past Medical History, Past Surgical History, Family History, and Social History were reviewed in [facility] electronic medical record.           Review of Systems  Constitutional: Negative for fever and unexpected weight change.  HENT: Negative for congestion, dental problem, ear pain, nosebleeds, postnasal drip, rhinorrhea, sinus pressure, sneezing, sore throat and trouble swallowing.   Eyes: Negative for redness and itching.  Respiratory: Positive for cough, shortness of breath and wheezing. Negative for chest tightness.   Cardiovascular: Negative for palpitations and leg swelling.  Gastrointestinal: Negative for nausea and vomiting.  Genitourinary: Negative for dysuria.  Musculoskeletal: Negative for joint swelling.  Skin: Negative for rash.   Neurological: Negative for headaches.  Hematological: Bruises/bleeds easily.  Psychiatric/Behavioral: Negative for dysphoric mood. The patient is not nervous/anxious.        Objective:   Physical Exam  amb wf nad  Wt Readings from Last 3 Encounters:  10/04/13 188 lb 3.2 oz (85.367 kg)  05/01/12 192 lb (87.091 kg)  05/01/12 192 lb (87.091 kg)      HEENT: nl dentition, turbinates, and orophanx. Nl external ear canals without cough reflex   NECK :  without JVD/Nodes/TM/ nl carotid upstrokes bilaterally   LUNGS: no acc muscle use, clear to A and P bilaterally without cough on insp or exp maneuvers   CV:  RRR  no s3 or murmur or increase in P2, no edema   ABD:  soft and nontender with nl excursion in the supine position. No bruits or organomegaly, bowel sounds nl  MS:  warm without deformities, calf tenderness, cyanosis or clubbing  SKIN: warm and dry without lesions    NEURO:  alert, approp, no deficits    09/18/13 Cxr Chronic bronchitic type interstitial lung changes without definite  acute overlying pulmonary process. High-resolution chest CT may be  helpful for further evaluation.       Assessment & Plan:

## 2013-10-06 DIAGNOSIS — I10 Essential (primary) hypertension: Secondary | ICD-10-CM | POA: Insufficient documentation

## 2013-10-06 NOTE — Assessment & Plan Note (Signed)
ACE inhibitors are problematic in  pts with airway complaints because  even experienced pulmonologists can't always distinguish ace effects from copd/asthma.  By themselves they don't actually cause a problem, much like oxygen can't by itself start a fire, but they certainly serve as a powerful catalyst or enhancer for any "fire"  or inflammatory process in the upper airway, be it caused by an ET  tube or more commonly reflux (especially in the obese or pts with known GERD or who are on biphoshonates).    In the era of ARB near equivalency until we have a better handle on the reversibility of the airway problem, it just makes sense to avoid ACEI  entirely in the short run and then decide later, having established a level of airway control using a reasonable limited regimen, whether to add back ace but even then being very careful to observe the pt for worsening airway control and number of meds used/ needed to control symptoms.   For now try benicar 20/12.5 mg daily

## 2013-10-06 NOTE — Assessment & Plan Note (Signed)
Most likely this represents  Upper airway cough syndrome, so named because it's frequently impossible to sort out how much is  CR/sinusitis with freq throat clearing (which can be related to primary GERD)   vs  causing  secondary (" extra esophageal")  GERD from wide swings in gastric pressure that occur with throat clearing, often  promoting self use of mint and menthol lozenges that reduce the lower esophageal sphincter tone and exacerbate the problem further in a cyclical fashion.   These are the same pts (now being labeled as having "irritable larynx syndrome" by some cough centers) who not infrequently have a history of having failed to tolerate ace inhibitors,  dry powder inhalers or biphosphonates or report having atypical reflux symptoms that don't respond to standard doses of PPI , and are easily confused as having aecopd or asthma flares by even experienced allergists/ pulmonologists.   For now needs trial off acei and on short term GERD rx then regroup in 4 weeks to answer question of ILD/ asthma.

## 2013-10-07 ENCOUNTER — Other Ambulatory Visit: Payer: Self-pay | Admitting: Internal Medicine

## 2013-10-07 ENCOUNTER — Telehealth: Payer: Self-pay | Admitting: Internal Medicine

## 2013-10-07 DIAGNOSIS — R059 Cough, unspecified: Secondary | ICD-10-CM

## 2013-10-07 DIAGNOSIS — R05 Cough: Secondary | ICD-10-CM

## 2013-10-07 NOTE — Telephone Encounter (Signed)
Called and spoke with pt and she is aware of samples that have been left up front for her.  She will come by tomorrow to pick these up.

## 2013-10-14 ENCOUNTER — Telehealth: Payer: Self-pay | Admitting: Internal Medicine

## 2013-10-14 DIAGNOSIS — R059 Cough, unspecified: Secondary | ICD-10-CM

## 2013-10-14 DIAGNOSIS — R05 Cough: Secondary | ICD-10-CM

## 2013-10-14 MED ORDER — TRAMADOL HCL 50 MG PO TABS: ORAL_TABLET | ORAL | Status: DC

## 2013-10-14 NOTE — Telephone Encounter (Signed)
Called and spoke with pt and she stated that she was seen by MW on 10/04/13 and given pred taper and she finished this last thrusday.  She stated that Dr. Tiburcio PeaHarris gave her benzonatate but this does not help her cough at all.  She stated that the cough that she has still has the thick, cleare/yellow sputum at times.  She is wheezing some now.  Pt stated that she feels that she needs something else to help get rid of this.  Wanted MW recs.  Please advise. Thanks  Allergies  Allergen Reactions  . Hydrocodone     Makes her dizzy  . Simvastatin Rash     Current Outpatient Prescriptions on File Prior to Visit  Medication Sig Dispense Refill  . albuterol (PROAIR HFA) 108 (90 BASE) MCG/ACT inhaler Inhale 2 puffs into the lungs every 6 (six) hours as needed for wheezing or shortness of breath.      Marland Kitchen. aspirin 81 MG tablet Take 81 mg by mouth daily. Takes 3 x times weekly      . conjugated estrogens (PREMARIN) vaginal cream Place 1 Applicatorful vaginally daily.      Marland Kitchen. LORazepam (ATIVAN) 0.5 MG tablet Take 0.5 mg by mouth 2 (two) times daily.      Marland Kitchen. olmesartan-hydrochlorothiazide (BENICAR HCT) 20-12.5 MG per tablet Take 1 tablet by mouth daily.      . predniSONE (DELTASONE) 10 MG tablet Take  4 each am x 2 days,   2 each am x 2 days,  1 each am x 2 days and stop  14 tablet  0   No current facility-administered medications on file prior to visit.

## 2013-10-14 NOTE — Telephone Encounter (Signed)
It is too soon to determine how much the blood pressure pill was contributing to the cough but while waiting for the effects to wear off rec  1) Ct sinus limited, to be sure there isn's a sinus problem contributing and  2) tramadol 50 mg one every 4 hours if needed (#40) to use with delsym otc

## 2013-10-14 NOTE — Telephone Encounter (Signed)
Called and spoke with pt and she is aware of MW recs. Order has been placed for the CT sinus.  Pt is aware that the tramadol has been called to her pharmacy and that she can use the delsym as well.

## 2013-10-18 ENCOUNTER — Encounter: Payer: Self-pay | Admitting: Internal Medicine

## 2013-10-18 ENCOUNTER — Ambulatory Visit (INDEPENDENT_AMBULATORY_CARE_PROVIDER_SITE_OTHER)
Admission: RE | Admit: 2013-10-18 | Discharge: 2013-10-18 | Disposition: A | Discharge status: Home / Self Care | Payer: Medicare Other | Source: Ambulatory Visit | Attending: Internal Medicine | Admitting: Internal Medicine

## 2013-10-18 DIAGNOSIS — R05 Cough: Secondary | ICD-10-CM

## 2013-10-18 DIAGNOSIS — R059 Cough, unspecified: Secondary | ICD-10-CM

## 2013-10-31 ENCOUNTER — Telehealth: Payer: Self-pay | Admitting: Internal Medicine

## 2013-10-31 NOTE — Telephone Encounter (Signed)
Called and spoke with pt. She reports she is scheduled to have PFT Tuesday and see MW. She reports right now she is still coughing and wheezing and doesn't;t feel the PFT will be accurate. She will wants to keep appt with MW but cancel the PFT. She is coughing up clear-yellow stringy phlem.  She is taking delsym and chlortrimeton Please advise MW thanks

## 2013-10-31 NOTE — Telephone Encounter (Signed)
That's fine but be sure she brings all active meds with her to regroup re cough management

## 2013-10-31 NOTE — Telephone Encounter (Signed)
Called and spoke with pt. Made aware and appt for PFT cancelled. Nothing further needed

## 2013-11-01 ENCOUNTER — Ambulatory Visit: Payer: Medicare Other | Admitting: Internal Medicine

## 2013-11-05 ENCOUNTER — Ambulatory Visit (INDEPENDENT_AMBULATORY_CARE_PROVIDER_SITE_OTHER): Payer: Medicare Other | Admitting: Internal Medicine

## 2013-11-05 ENCOUNTER — Encounter: Payer: Self-pay | Admitting: Internal Medicine

## 2013-11-05 ENCOUNTER — Other Ambulatory Visit (INDEPENDENT_AMBULATORY_CARE_PROVIDER_SITE_OTHER): Payer: Medicare Other

## 2013-11-05 VITALS — BP 118/84 | HR 94 | Temp 98.0°F | Ht 62.0 in | Wt 184.0 lb

## 2013-11-05 DIAGNOSIS — J45909 Unspecified asthma, uncomplicated: Secondary | ICD-10-CM

## 2013-11-05 DIAGNOSIS — R05 Cough: Secondary | ICD-10-CM

## 2013-11-05 DIAGNOSIS — R059 Cough, unspecified: Secondary | ICD-10-CM

## 2013-11-05 LAB — CBC WITH DIFFERENTIAL/PLATELET
BASOS PCT: 0.9 % (ref 0.0–3.0)
Basophils Absolute: 0.1 10*3/uL (ref 0.0–0.1)
EOS ABS: 0.4 10*3/uL (ref 0.0–0.7)
Eosinophils Relative: 6.1 % — ABNORMAL HIGH (ref 0.0–5.0)
HEMATOCRIT: 45 % (ref 36.0–46.0)
Hemoglobin: 14.9 g/dL (ref 12.0–15.0)
LYMPHS ABS: 1.8 10*3/uL (ref 0.7–4.0)
Lymphocytes Relative: 25.9 % (ref 12.0–46.0)
MCHC: 33.2 g/dL (ref 30.0–36.0)
MCV: 93.7 fl (ref 78.0–100.0)
MONO ABS: 0.5 10*3/uL (ref 0.1–1.0)
Monocytes Relative: 7 % (ref 3.0–12.0)
NEUTROS PCT: 60.1 % (ref 43.0–77.0)
Neutro Abs: 4.2 10*3/uL (ref 1.4–7.7)
Platelets: 324 10*3/uL (ref 150.0–400.0)
RBC: 4.8 Mil/uL (ref 3.87–5.11)
RDW: 13.1 % (ref 11.5–14.6)
WBC: 7 10*3/uL (ref 4.5–10.5)

## 2013-11-05 MED ORDER — OLMESARTAN MEDOXOMIL-HCTZ 20-12.5 MG PO TABS: ORAL_TABLET | ORAL | Status: DC

## 2013-11-05 MED ORDER — TRAMADOL HCL 50 MG PO TABS: ORAL_TABLET | ORAL | Status: DC

## 2013-11-05 MED ORDER — PREDNISONE 10 MG PO TABS: ORAL_TABLET | ORAL | Status: DC

## 2013-11-05 NOTE — Progress Notes (Signed)
Subjective:    Patient ID: Casey Freeman, female    DOB: Mar 22, 1936  MRN: [id number]   Brief patient profile:   [age] yowf never smoker with h/o watery rhinitis/ cough x sev months typically in fall since around 2009 then new onset sob and cough intially dx as bronchitis late 2013  rx zpak and cough syrup and better but  Then newly  started on prn albuterol for wheezing rarely needed until Aug 2014  referred 10/04/2013 by Dr Tiburcio PeaHarris for progressively worse  Wheeze/ cough  despite adding symbicort.   History of Present Illness  10/04/2013 1st [facility] office visit/ Calynn Ferrero cc daily sob/ cough x 4-5  m assoc with variable sob to point where has trouble sometimes with adls and even sometimes sob at rest if coughing assoc with hoarseness, dry cough and subwheeze day > night. rec Stop linsinopril  benicar 20/12.5 one daily  Try prilosec 20mg   Take 30-60 min before first meal of the day and Pepcid 20 mg one bedtime until cough is completely gone for at least a week without the need for cough suppression Best cough medication is delsym GERD diet  Prednisone 10 mg take  4 each am x 2 days,   2 each am x 2 days,  1 each am x 2 days and stop  Only use your albuterol as needed    11/05/2013 f/u ov/Shaterra Sanzone re: cough since Fall of 2014  Chief Complaint  Patient presents with  . Follow-up    Pt states that her cough, SOB and wheezing gradually worse since her last visit. Cough is prod with minimal yellow to clear sputum. She is using proair approx 3 x per wk.    If not coughing Sob only with exertion like getting in a real hurry or a flight of steps, much better p last rx with prednisone - cough worse day than night  No obvious day to day or daytime variabilty or assoc chronic cough or cp or chest tightness, subjective wheeze overt sinus or hb symptoms. No unusual exp hx or h/o childhood pna/ asthma or knowledge of premature birth.  Sleeping ok without nocturnal  or early am exacerbation  of  respiratory  c/o's or need for noct saba. Also denies any obvious fluctuation of symptoms with weather or environmental changes or other aggravating or alleviating factors except as outlined above   Current Medications, Allergies, Complete Past Medical History, Past Surgical History, Family History, and Social History were reviewed in [facility] electronic medical record.  ROS  The following are not active complaints unless bolded sore throat, dysphagia, dental problems, itching, sneezing,  nasal congestion or excess/ purulent secretions, ear ache,   fever, chills, sweats, unintended wt loss, pleuritic or exertional cp, hemoptysis,  orthopnea pnd or leg swelling, presyncope, palpitations, heartburn, abdominal pain, anorexia, nausea, vomiting, diarrhea  or change in bowel or urinary habits, change in stools or urine, dysuria,hematuria,  rash, arthralgias, visual complaints, headache, numbness weakness or ataxia or problems with walking or coordination,  change in mood/affect or memory.                       Objective:   Physical Exam  amb wf nad  11/05/2013         184  Wt Readings from Last 3 Encounters:  10/04/13 188 lb 3.2 oz (85.367 kg)  05/01/12 192 lb (87.091 kg)  05/01/12 192 lb (87.091 kg)      HEENT: nl  dentition, turbinates, and orophanx. Nl external ear canals without cough reflex   NECK :  without JVD/Nodes/TM/ nl carotid upstrokes bilaterally   LUNGS:  inps pops and squeaks and exp wheeze   CV:  RRR  no s3 or murmur or increase in P2, no edema   ABD:  soft and nontender with nl excursion in the supine position. No bruits or organomegaly, bowel sounds nl  MS:  warm without deformities, calf tenderness, cyanosis or clubbing  SKIN: warm and dry without lesions    NEURO:  alert, approp, no deficits    09/18/13 Cxr Chronic bronchitic type interstitial lung changes without definite  acute overlying pulmonary process. High-resolution chest CT may be  helpful  for further evaluation.       Assessment & Plan:

## 2013-11-05 NOTE — Patient Instructions (Addendum)
Prednisone 10 mg take  4 each am x 2 days,   2 each am x 2 days,  1 each am x 2 days and stop   Dulera 100  Take 2 puffs first thing in am and then another 2 puffs about 12 hours later until you return  Work on inhaler technique:  relax and gently blow all the way out then take a nice smooth deep breath back in, triggering the inhaler at same time you start breathing in.  Hold for up to 5 seconds if you can.  Rinse and gargle with water when done        Continue Prilosec 20mg   Take 30-60 min before first meal of the day and Pepcid 20 mg one bedtime and chlortrimeton 4mg  one at bedtime   Take delsym two tsp every 12 hours and supplement if needed with  tramadol 50 mg up to 2 every 4 hours to suppress the urge to cough. Swallowing water or using ice chips/non mint and menthol containing candies (such as lifesavers or sugarless jolly ranchers) are also effective.  You should rest your voice and avoid activities that you know make you cough.  Once you have eliminated the cough for 3 straight days try reducing the tramadol first,  then the delsym as tolerated.    Please schedule a follow up office visit in 2 weeks, sooner if needed

## 2013-11-06 ENCOUNTER — Encounter: Payer: Self-pay | Admitting: Internal Medicine

## 2013-11-06 DIAGNOSIS — J45991 Cough variant asthma: Secondary | ICD-10-CM | POA: Insufficient documentation

## 2013-11-06 LAB — ALLERGY PROFILE REGION II-DC, DE, MD, ~~LOC~~, VA
Allergen, D pternoyssinus,d7: 0.46 kU/L — ABNORMAL HIGH
Alternaria Alternata: <0.1 kU/L
Bermuda Grass: <0.1 kU/L
Box Elder IgE: <0.1 kU/L
Cat Dander: <0.1 kU/L
D. farinae: 0.96 kU/L — ABNORMAL HIGH
Dog Dander: <0.1 kU/L
Elm IgE: <0.1 kU/L
IGE (IMMUNOGLOBULIN E), SERUM: 362.9 [IU]/mL — AB (ref 0.0–180.0)
Pecan/Hickory Tree IgE: <0.1 kU/L

## 2013-11-06 NOTE — Assessment & Plan Note (Signed)
Unusual at her age to suddenly become atopic if this is the case, will await RAST  In meantime rx with short course systemic steroids and low dose dulera (see cough a/p)  The proper method of use, as well as anticipated side effects, of a metered-dose inhaler are discussed and demonstrated to the patient. Improved effectiveness after extensive coaching during this visit to a level of approximately  75%

## 2013-11-06 NOTE — Progress Notes (Signed)
Quick Note:  Spoke with pt and notified of results per Dr. Wert. Pt verbalized understanding and denied any questions.  ______ 

## 2013-11-06 NOTE — Assessment & Plan Note (Addendum)
-   ACEi d/c 10/04/2013  - Sinus CT 10/18/2013 > Clear paranasal sinuses - Allergy profile 11/05/2013 >  Eos 6.1%  IgE 363   Clearly not all acei related as has findings on exam to suggest asthmatic bronchitis and probably underlying atopic dz but should still avoid acei until this is all sorted out     rec trial of dulera 100 2bid with the caveat that sometimes ICS make the cough worse, esp in high doses, with qvar in low doses a good step down option once we get her breathing better.

## 2013-11-19 ENCOUNTER — Ambulatory Visit: Payer: Medicare Other | Admitting: Internal Medicine

## 2013-11-25 ENCOUNTER — Encounter: Payer: Self-pay | Admitting: Internal Medicine

## 2013-11-25 ENCOUNTER — Ambulatory Visit (INDEPENDENT_AMBULATORY_CARE_PROVIDER_SITE_OTHER): Payer: Medicare Other | Admitting: Internal Medicine

## 2013-11-25 VITALS — BP 106/70 | HR 73 | Temp 97.8°F | Ht 62.0 in | Wt 189.6 lb

## 2013-11-25 DIAGNOSIS — I10 Essential (primary) hypertension: Secondary | ICD-10-CM

## 2013-11-25 DIAGNOSIS — J45909 Unspecified asthma, uncomplicated: Secondary | ICD-10-CM

## 2013-11-25 MED ORDER — TELMISARTAN-HCTZ 80-12.5 MG PO TABS: 1.0000 | ORAL_TABLET | Freq: Every day | ORAL | Status: DC

## 2013-11-25 NOTE — Progress Notes (Signed)
Subjective:    Patient ID: Casey Freeman, female    DOB: 1935/12/07  MRN: [id number]   Brief patient profile:   [age] yowf never smoker with h/o watery rhinitis/ cough x sev months typically in fall since around 2009 then new onset sob and cough intially dx as bronchitis late 2013  rx zpak and cough syrup and better but  Then newly  started on prn albuterol for wheezing rarely needed until Aug 2014  referred 10/04/2013 by Casey Freeman for progressively worse  Wheeze/ cough  despite adding symbicort.   History of Present Illness  10/04/2013 1st  [facility] office visit/ Casey Freeman cc daily sob/ cough x 4-5  m assoc with variable sob to point where has trouble sometimes with adls and even sometimes sob at rest if coughing assoc with hoarseness, dry cough and subwheeze day > night. rec Stop linsinopril  benicar 20/12.5 one daily  Try prilosec 20mg   Take 30-60 min before first meal of the day and Pepcid 20 mg one bedtime until cough is completely gone for at least a week without the need for cough suppression Best cough medication is delsym GERD diet  Prednisone 10 mg take  4 each am x 2 days,   2 each am x 2 days,  1 each am x 2 days and stop  Only use your albuterol as needed    11/05/2013 f/u ov/Casey Freeman re: cough since Fall of 2014  Chief Complaint  Patient presents with  . Follow-up    Pt states that her cough, SOB and wheezing gradually worse since her last visit. Cough is prod with minimal yellow to clear sputum. She is using proair approx 3 x per wk.    If not coughing Sob only with exertion like getting in a real hurry or a flight of steps, much better p last rx with prednisone - cough worse day than night rec Prednisone 10 mg take  4 each am x 2 days,   2 each am x 2 days,  1 each am x 2 days and stop   Dulera 100  Take 2 puffs first thing in am and then another 2 puffs about 12 hours later until you return  Work on inhaler technique:  relax and gently blow all the way out then take a  nice smooth deep breath back in, triggering the inhaler at same time you start breathing in.  Hold for up to 5 seconds if you can.  Rinse and gargle with water when done        Continue Prilosec 20mg   Take 30-60 min before first meal of the day and Pepcid 20 mg one bedtime and chlortrimeton 4mg  one at bedtime   Take delsym two tsp every 12 hours and supplement if needed with  tramadol 50 mg up to 2 every 4 hours   11/25/2013 f/u ov/Casey Freeman re:  Cough since Aug 2014 resolved on dulera 100 2bid p rx with pred Chief Complaint  Patient presents with  . Follow-up    Cough has resolved as of a wk ago. She did notice minimal wheeze this am.  Her breathing has improved and back to her normal baseline.    no more tramadol,  Rare proair need.Not limited by breathing from desired activities     No obvious day to day or daytime variabilty or assoc   cp or chest tightness, subjective wheeze overt sinus or hb symptoms. No unusual exp hx or h/o childhood pna/ asthma or knowledge of  premature birth.  Sleeping ok without nocturnal  or early am exacerbation  of respiratory  c/o's or need for noct saba. Also denies any obvious fluctuation of symptoms with weather or environmental changes or other aggravating or alleviating factors except as outlined above   Current Medications, Allergies, Complete Past Medical History, Past Surgical History, Family History, and Social History were reviewed in [facility] record.  ROS  The following are not active complaints unless bolded sore throat, dysphagia, dental problems, itching, sneezing,  nasal congestion or excess/ purulent secretions, ear ache,   fever, chills, sweats, unintended wt loss, pleuritic or exertional cp, hemoptysis,  orthopnea pnd or leg swelling, presyncope, palpitations, heartburn, abdominal pain, anorexia, nausea, vomiting, diarrhea  or change in bowel or urinary habits, change in stools or urine, dysuria,hematuria,  rash,  arthralgias, visual complaints, headache, numbness weakness or ataxia or problems with walking or coordination,  change in mood/affect or memory.                       Objective:   Physical Exam  amb wf nad  11/05/2013         184 > 11/25/2013   190  Wt Readings from Last 3 Encounters:  10/04/13 188 lb 3.2 oz (85.367 kg)  05/01/12 192 lb (87.091 kg)  05/01/12 192 lb (87.091 kg)      HEENT: nl dentition, turbinates, and orophanx. Nl external ear canals without cough reflex   NECK :  without JVD/Nodes/TM/ nl carotid upstrokes bilaterally   LUNGS:  Very min bilateral insp  squeaks bases    CV:  RRR  no s3 or murmur or increase in P2, no edema   ABD:  soft and nontender with nl excursion in the supine position. No bruits or organomegaly, bowel sounds nl  MS:  warm without deformities, calf tenderness, cyanosis or clubbing  SKIN: warm and dry without lesions       09/18/13 Cxr Chronic bronchitic type interstitial lung changes without definite  acute overlying pulmonary process. High-resolution chest CT may be  helpful for further evaluation.       Assessment & Plan:

## 2013-11-25 NOTE — Patient Instructions (Addendum)
Finish up your benicar and micardis 80/12.5 one half daily  Stop dulera and start symbicort 160 2 pffs first thing am x one month  Ok to wean off the acid suppression and only if needed for acid or coughing.   Please schedule a follow up office visit in 4 weeks, sooner if needed

## 2013-11-27 MED ORDER — BUDESONIDE-FORMOTEROL FUMARATE 160-4.5 MCG/ACT IN AERO: INHALATION_SPRAY | RESPIRATORY_TRACT | Status: DC

## 2013-11-27 NOTE — Assessment & Plan Note (Signed)
Much better but still sign insp squeaks ? Related to bronchiolitis? And starting to need saba again p 100% better p pred rx so may not have enough ICS on board  The proper method of use, as well as anticipated side effects, of a metered-dose inhaler are discussed and demonstrated to the patient. Improved effectiveness after extensive coaching during this visit to a level of approximately  90% so try symbicort 160 2bid    Each maintenance medication was reviewed in detail including most importantly the difference between maintenance and as needed and under what circumstances the prns are to be used.  Please see instructions for details which were reviewed in writing and the patient given a copy.

## 2013-11-27 NOTE — Assessment & Plan Note (Signed)
Much better off acei but not  insurance coverage not good for benicar > try micardis

## 2013-12-23 ENCOUNTER — Encounter: Payer: Self-pay | Admitting: Internal Medicine

## 2013-12-23 ENCOUNTER — Ambulatory Visit (INDEPENDENT_AMBULATORY_CARE_PROVIDER_SITE_OTHER): Payer: Medicare Other | Admitting: Internal Medicine

## 2013-12-23 VITALS — BP 100/68 | HR 86 | Temp 97.8°F | Ht 62.0 in | Wt 190.0 lb

## 2013-12-23 DIAGNOSIS — J704 Drug-induced interstitial lung disorders, unspecified: Secondary | ICD-10-CM

## 2013-12-23 DIAGNOSIS — R05 Cough: Secondary | ICD-10-CM

## 2013-12-23 DIAGNOSIS — R059 Cough, unspecified: Secondary | ICD-10-CM

## 2013-12-23 DIAGNOSIS — J45909 Unspecified asthma, uncomplicated: Secondary | ICD-10-CM

## 2013-12-23 DIAGNOSIS — R9389 Abnormal findings on diagnostic imaging of other specified body structures: Secondary | ICD-10-CM

## 2013-12-23 DIAGNOSIS — R918 Other nonspecific abnormal finding of lung field: Secondary | ICD-10-CM

## 2013-12-23 DIAGNOSIS — I10 Essential (primary) hypertension: Secondary | ICD-10-CM

## 2013-12-23 HISTORY — DX: Drug-induced interstitial lung disorders, unspecified: J70.4

## 2013-12-23 MED ORDER — BUDESONIDE-FORMOTEROL FUMARATE 160-4.5 MCG/ACT IN AERO: INHALATION_SPRAY | RESPIRATORY_TRACT | Status: DC

## 2013-12-23 NOTE — Progress Notes (Signed)
Subjective:    Patient ID: Casey Freeman, female    DOB: Nov 03, 1935  MRN: [id number]   Brief patient profile:   58 yowf never smoker with h/o watery rhinitis/ cough x sev months typically in fall since around 2009 then new onset sob and cough intially dx as bronchitis late 2013  rx zpak and cough syrup and better but  Then newly  started on prn albuterol for wheezing rarely needed until Aug 2014  referred 10/04/2013 by Dr Tiburcio Pea for progressively worse  Wheeze/ cough  despite adding symbicort.   History of Present Illness  10/04/2013 1st [facility] office visit/ Casey Freeman cc daily sob/ cough x 4-5  m assoc with variable sob to point where has trouble sometimes with adls and even sometimes sob at rest if coughing assoc with hoarseness, dry cough and subwheeze day > night. rec Stop linsinopril  benicar 20/12.5 one daily  Try prilosec 20mg   Take 30-60 min before first meal of the day and Pepcid 20 mg one bedtime until cough is completely gone for at least a week without the need for cough suppression Best cough medication is delsym GERD diet  Prednisone 10 mg take  4 each am x 2 days,   2 each am x 2 days,  1 each am x 2 days and stop  Only use your albuterol as needed    11/05/2013 f/u ov/Casey Freeman re: cough since Fall of 2014  Chief Complaint  Patient presents with  . Follow-up    Pt states that her cough, SOB and wheezing gradually worse since her last visit. Cough is prod with minimal yellow to clear sputum. She is using proair approx 3 x per wk.    If not coughing Sob only with exertion like getting in a real hurry or a flight of steps, much better p last rx with prednisone - cough worse day than night rec Prednisone 10 mg take  4 each am x 2 days,   2 each am x 2 days,  1 each am x 2 days and stop  Dulera 100  Take 2 puffs first thing in am and then another 2 puffs about 12 hours later until you return Work on inhaler technique:   Continue Prilosec 20mg   Take 30-60 min before first  meal of the day and Pepcid 20 mg one bedtime and chlortrimeton 4mg  one at bedtime  Take delsym two tsp every 12 hours and supplement if needed with  tramadol 50 mg up to 2 every 4 hours   11/25/2013 f/u ov/Casey Freeman re:  Cough since Aug 2014 resolved on dulera 100 2bid p rx with pred Chief Complaint  Patient presents with  . Follow-up    Cough has resolved as of a wk ago. She did notice minimal wheeze this am.  Her breathing has improved and back to her normal baseline.   no more tramadol,  Rare proair need.Not limited by breathing from desired activities   rec Finish up your benicar and micardis 80/12.5 one half daily Stop dulera and start symbicort 160 2 pffs first thing am x one month Ok to wean off the acid suppression and only if needed for acid or coughing.    12/23/2013 f/u ov/Casey Freeman re: chronic cough/ ok off symbiocort / off acid /on  micardis Chief Complaint  Patient presents with  . Follow-up    Pt reports her cough is much improved and her breathing is doing well. Has not used any inhalers in the past wk.  Not limited by breathing from desired activities  - no need for saba or cough suppresion  No obvious day to day or daytime variabilty or assoc   cp or chest tightness, subjective wheeze overt sinus or hb symptoms. No unusual exp hx or h/o childhood pna/ asthma or knowledge of premature birth.  Sleeping ok without nocturnal  or early am exacerbation  of respiratory  c/o's or need for noct saba. Also denies any obvious fluctuation of symptoms with weather or environmental changes or other aggravating or alleviating factors except as outlined above   Current Medications, Allergies, Complete Past Medical History, Past Surgical History, Family History, and Social History were reviewed in [facility] electronic medical record.  ROS  The following are not active complaints unless bolded sore throat, dysphagia, dental problems, itching, sneezing,  nasal congestion or excess/ purulent  secretions, ear ache,   fever, chills, sweats, unintended wt loss, pleuritic or exertional cp, hemoptysis,  orthopnea pnd or leg swelling, presyncope, palpitations, heartburn, abdominal pain, anorexia, nausea, vomiting, diarrhea  or change in bowel or urinary habits, change in stools or urine, dysuria,hematuria,  rash, arthralgias, visual complaints, headache, numbness weakness or ataxia or problems with walking or coordination,  change in mood/affect or memory.         Objective:   Physical Exam  amb wf nad  11/05/2013         184 > 11/25/2013   190 > 12/23/2013  190  Wt Readings from Last 3 Encounters:  10/04/13 188 lb 3.2 oz (85.367 kg)  05/01/12 192 lb (87.091 kg)  05/01/12 192 lb (87.091 kg)      HEENT: nl dentition, turbinates, and orophanx. Nl external ear canals without cough reflex   NECK :  without JVD/Nodes/TM/ nl carotid upstrokes bilaterally   LUNGS:  Very min bilateral insp squeaks bases    CV:  RRR  no s3 or murmur or increase in P2, no edema   ABD:  soft and nontender with nl excursion in the supine position. No bruits or organomegaly, bowel sounds nl  MS:  warm without deformities, calf tenderness, cyanosis or clubbing  SKIN: warm and dry without lesions       09/18/13 Cxr Chronic bronchitic type interstitial lung changes without definite  acute overlying pulmonary process. High-resolution chest CT may be  helpful for further evaluation.       Assessment & Plan:

## 2013-12-23 NOTE — Assessment & Plan Note (Signed)
Off all inhalers successfully, on to resume  symibocort 2bid   should any symptoms of cough or sob return but not need for maint rx at this point.

## 2013-12-23 NOTE — Assessment & Plan Note (Addendum)
Excellent control on micardis 80/12.5 one half daily >defer chronic rx and f/u to Dr Leonides Sakeandy Harris

## 2013-12-23 NOTE — Assessment & Plan Note (Signed)
-   ACEi d/c 10/04/2013  - Sinus CT 10/18/2013 > Clear paranasal sinuses - Allergy profile 11/05/2013 >  Eos 6.1%  IgE 363 but RAST only pos to dust  Completely resolved at this point, no evidence of asthma at all so most likley this was  Classic Upper airway cough syndrome, so named because it's frequently impossible to sort out how much is  CR/sinusitis with freq throat clearing (which can be related to primary GERD)   vs  causing  secondary (" extra esophageal")  GERD from wide swings in gastric pressure that occur with throat clearing, often  promoting self use of mint and menthol lozenges that reduce the lower esophageal sphincter tone and exacerbate the problem further in a cyclical fashion.   These are the same pts (now being labeled as having "irritable larynx syndrome" by some cough centers) who not infrequently have a history of having failed to tolerate ace inhibitors,  dry powder inhalers or biphosphonates or report having atypical reflux symptoms that don't respond to standard doses of PPI , and are easily confused as having aecopd or asthma flares by even experienced allergists/pulmonologists.   For now she's feeling fine on a simplified rx with no need for pulmonary rx so we'll see her back prn

## 2013-12-23 NOTE — Patient Instructions (Signed)
Return for any decline in exercise tolerance or a cough for more than 3 weeks

## 2013-12-23 NOTE — Assessment & Plan Note (Addendum)
She does have a few insp squeaks on exam and slt prominent interstitial marking on cxr both of which are non - specific findings  rec  Avoid all future macrodantin exposure  F/u here for any new doe or chronic cough

## 2014-04-28 ENCOUNTER — Other Ambulatory Visit (HOSPITAL_COMMUNITY): Payer: Self-pay

## 2014-04-28 DIAGNOSIS — R0602 Shortness of breath: Secondary | ICD-10-CM

## 2014-05-07 ENCOUNTER — Ambulatory Visit (HOSPITAL_COMMUNITY)
Admission: RE | Admit: 2014-05-07 | Discharge: 2014-05-07 | Disposition: A | Discharge status: Home / Self Care | Payer: Medicare Other | Source: Ambulatory Visit | Attending: Family Medicine | Admitting: Family Medicine

## 2014-05-07 DIAGNOSIS — R0602 Shortness of breath: Secondary | ICD-10-CM | POA: Insufficient documentation

## 2014-05-07 MED ORDER — ALBUTEROL SULFATE (2.5 MG/3ML) 0.083% IN NEBU
2.5000 mg | INHALATION_SOLUTION | Freq: Once | RESPIRATORY_TRACT | Status: AC
  Administered 2014-05-07: 2.5 mg via RESPIRATORY_TRACT

## 2014-05-08 LAB — PULMONARY FUNCTION TEST
DL/VA % PRED: 91 %
DL/VA: 4.28 ml/min/mmHg/L
DLCO COR: 10.93 ml/min/mmHg
DLCO UNC % PRED: 47 %
DLCO cor % pred: 47 %
DLCO unc: 10.93 ml/min/mmHg
FEF 25-75 PRE: 1.85 L/s
FEF 25-75 Post: 1.92 L/sec
FEF2575-%Change-Post: 3 %
FEF2575-%Pred-Post: 122 %
FEF2575-%Pred-Pre: 118 %
FEV1-%Change-Post: 0 %
FEV1-%Pred-Post: 70 %
FEV1-%Pred-Pre: 70 %
FEV1-Post: 1.41 L
FEV1-Pre: 1.41 L
FEV1FVC-%Change-Post: 6 %
FEV1FVC-%Pred-Pre: 113 %
FEV6-%Change-Post: -5 %
FEV6-%PRED-POST: 61 %
FEV6-%PRED-PRE: 64 %
FEV6-POST: 1.54 L
FEV6-PRE: 1.64 L
FEV6FVC-%CHANGE-POST: 0 %
FEV6FVC-%PRED-PRE: 104 %
FEV6FVC-%Pred-Post: 104 %
FVC-%Change-Post: -6 %
FVC-%PRED-PRE: 62 %
FVC-%Pred-Post: 58 %
FVC-POST: 1.55 L
FVC-PRE: 1.65 L
POST FEV6/FVC RATIO: 99 %
Post FEV1/FVC ratio: 91 %
Pre FEV1/FVC ratio: 85 %
Pre FEV6/FVC Ratio: 99 %
RV % pred: 97 %
RV: 2.2 L
TLC % PRED: 81 %
TLC: 4 L

## 2014-06-20 ENCOUNTER — Other Ambulatory Visit: Payer: Self-pay

## 2014-06-20 DIAGNOSIS — Z1231 Encounter for screening mammogram for malignant neoplasm of breast: Secondary | ICD-10-CM

## 2014-06-27 ENCOUNTER — Ambulatory Visit
Admission: RE | Admit: 2014-06-27 | Discharge: 2014-06-27 | Disposition: A | Discharge status: Home / Self Care | Payer: Medicare Other | Source: Ambulatory Visit

## 2014-06-27 DIAGNOSIS — Z1231 Encounter for screening mammogram for malignant neoplasm of breast: Secondary | ICD-10-CM

## 2014-07-16 ENCOUNTER — Encounter: Payer: Self-pay | Admitting: Internal Medicine

## 2014-07-16 ENCOUNTER — Ambulatory Visit (INDEPENDENT_AMBULATORY_CARE_PROVIDER_SITE_OTHER)
Admission: RE | Admit: 2014-07-16 | Discharge: 2014-07-16 | Disposition: A | Discharge status: Home / Self Care | Payer: Medicare Other | Source: Ambulatory Visit | Attending: Internal Medicine | Admitting: Internal Medicine

## 2014-07-16 ENCOUNTER — Ambulatory Visit (INDEPENDENT_AMBULATORY_CARE_PROVIDER_SITE_OTHER): Payer: Medicare Other | Admitting: Internal Medicine

## 2014-07-16 ENCOUNTER — Other Ambulatory Visit (INDEPENDENT_AMBULATORY_CARE_PROVIDER_SITE_OTHER): Payer: Medicare Other

## 2014-07-16 VITALS — BP 134/86 | HR 95 | Temp 97.8°F | Ht 62.0 in | Wt 174.0 lb

## 2014-07-16 DIAGNOSIS — R9389 Abnormal findings on diagnostic imaging of other specified body structures: Secondary | ICD-10-CM

## 2014-07-16 DIAGNOSIS — R938 Abnormal findings on diagnostic imaging of other specified body structures: Secondary | ICD-10-CM

## 2014-07-16 DIAGNOSIS — R059 Cough, unspecified: Secondary | ICD-10-CM

## 2014-07-16 DIAGNOSIS — R05 Cough: Secondary | ICD-10-CM

## 2014-07-16 DIAGNOSIS — J45909 Unspecified asthma, uncomplicated: Secondary | ICD-10-CM

## 2014-07-16 LAB — CBC WITH DIFFERENTIAL/PLATELET
Basophils Absolute: 0 10*3/uL (ref 0.0–0.1)
Basophils Relative: 0.2 % (ref 0.0–3.0)
EOS PCT: 2 % (ref 0.0–5.0)
Eosinophils Absolute: 0.2 10*3/uL (ref 0.0–0.7)
HEMATOCRIT: 45.8 % (ref 36.0–46.0)
HEMOGLOBIN: 15.3 g/dL — AB (ref 12.0–15.0)
Lymphocytes Relative: 11.5 % — ABNORMAL LOW (ref 12.0–46.0)
Lymphs Abs: 1.4 10*3/uL (ref 0.7–4.0)
MCHC: 33.5 g/dL (ref 30.0–36.0)
MCV: 91.2 fl (ref 78.0–100.0)
MONO ABS: 0.7 10*3/uL (ref 0.1–1.0)
MONOS PCT: 5.4 % (ref 3.0–12.0)
NEUTROS ABS: 9.8 10*3/uL — AB (ref 1.4–7.7)
Neutrophils Relative %: 80.9 % — ABNORMAL HIGH (ref 43.0–77.0)
PLATELETS: 278 10*3/uL (ref 150.0–400.0)
RBC: 5.02 Mil/uL (ref 3.87–5.11)
RDW: 14.1 % (ref 11.5–15.5)
WBC: 12.1 10*3/uL — AB (ref 4.0–10.5)

## 2014-07-16 LAB — SEDIMENTATION RATE: Sed Rate: 32 mm/hr — ABNORMAL HIGH (ref 0–22)

## 2014-07-16 MED ORDER — PREDNISONE 10 MG PO TABS: ORAL_TABLET | ORAL | Status: DC

## 2014-07-16 MED ORDER — PANTOPRAZOLE SODIUM 40 MG PO TBEC: 40.0000 mg | DELAYED_RELEASE_TABLET | Freq: Every day | ORAL | Status: DC

## 2014-07-16 NOTE — Progress Notes (Signed)
Subjective:    Patient ID: Casey Freeman, female    DOB: January 25, 1936  MRN: [id number]   Brief patient profile:   [id number]  yowf never smoker with h/o watery rhinitis/ cough x sev months typically in fall since around 2009 then new onset sob and cough intially dx as bronchitis late 2013  rx zpak and cough syrup and better but  Then newly  started on prn albuterol for wheezing rarely needed until Aug 2014  referred 10/04/2013 by Dr Tiburcio PeaHarris for progressively worse  Wheeze/ cough  despite adding symbicort.   History of Present Illness  10/04/2013 1st [facility] office visit/ Wert cc daily sob/ cough x 4-5  m assoc with variable sob to point where has trouble sometimes with adls and even sometimes sob at rest if coughing assoc with hoarseness, dry cough and subwheeze day > night. rec Stop linsinopril  benicar 20/12.5 one daily  Try prilosec 20mg   Take 30-60 min before first meal of the day and Pepcid 20 mg one bedtime until cough is completely gone for at least a week without the need for cough suppression Best cough medication is delsym GERD diet  Prednisone 10 mg take  4 each am x 2 days,   2 each am x 2 days,  1 each am x 2 days and stop  Only use your albuterol as needed    11/05/2013 f/u ov/Wert re: cough since Fall of 2014  Chief Complaint  Patient presents with  . Follow-up    Pt states that her cough, SOB and wheezing gradually worse since her last visit. Cough is prod with minimal yellow to clear sputum. She is using proair approx 3 x per wk.   if not coughing Sob only with exertion like getting in a real hurry or a flight of steps, much better p last rx with prednisone - cough worse day than night rec Prednisone 10 mg take  4 each am x 2 days,   2 each am x 2 days,  1 each am x 2 days and stop  Dulera 100  Take 2 puffs first thing in am and then another 2 puffs about 12 hours later until you return Work on inhaler technique:   Continue Prilosec 20mg   Take 30-60 min before first meal  of the day and Pepcid 20 mg one bedtime and chlortrimeton 4mg  one at bedtime  Take delsym two tsp every 12 hours and supplement if needed with  tramadol 50 mg up to 2 every 4 hours   11/25/2013 f/u ov/Wert re:  Cough since Aug 2014 resolved on dulera 100 2bid p rx with pred Chief Complaint  Patient presents with  . Follow-up    Cough has resolved as of a wk ago. She did notice minimal wheeze this am.  Her breathing has improved and back to her normal baseline.   no more tramadol,  Rare proair need.Not limited by breathing from desired activities   rec Finish up your benicar and micardis 80/12.5 one half daily Stop dulera and start symbicort 160 2 pffs first thing am x one month Ok to wean off the acid suppression and only if needed for acid or coughing.    12/23/2013 f/u ov/Wert re: chronic cough/ ok off symbiocort / off acid /on  micardis Chief Complaint  Patient presents with  . Follow-up    Pt reports her cough is much improved and her breathing is doing well. Has not used any inhalers in the past wk.  Not limited by breathing from desired activities  - no need for saba or cough suppression rec Return for any decline in exercise tolerance or a cough for more than 3 weeks   07/16/2014  Acute  ov/Wert re: recurrent cough on ppi ac one daily  Chief Complaint  Patient presents with  . Acute Visit    Pt c/o increased cough with wheezing for the past 10 days.  Cough is prod with minimal to moderate thick, yellow sputum.   She also c/o increased SOB- using proair about 2 x per day.   since previously gradually worse with cough mostly daytime clear to yellow and sp at least  one course of macrodantin x  early Sept and multiple visits for "bronchitis" no better on various abx Cough to point of gagging, sob no better with proair and mostly with coughing   No obvious day to day or daytime variabilty or assoc   cp or chest tightness  overt sinus or hb symptoms. No unusual exp hx or h/o  childhood pna/ asthma or knowledge of premature birth.  Sleeping ok without nocturnal  or early am exacerbation  of respiratory  c/o's or need for noct saba. Also denies any obvious fluctuation of symptoms with weather or environmental changes or other aggravating or alleviating factors except as outlined above   Current Medications, Allergies, Complete Past Medical History, Past Surgical History, Family History, and Social History were reviewed in [facility] electronic medical record.  ROS  The following are not active complaints unless bolded sore throat, dysphagia, dental problems, itching, sneezing,  nasal congestion or excess/ purulent secretions, ear ache,   fever, chills, sweats, unintended wt loss, pleuritic or exertional cp, hemoptysis,  orthopnea pnd or leg swelling, presyncope, palpitations, heartburn, abdominal pain, anorexia, nausea, vomiting, diarrhea  or change in bowel or urinary habits, change in stools or urine, dysuria,hematuria,  rash, arthralgias, visual complaints, headache, numbness weakness or ataxia or problems with walking or coordination,  change in mood/affect or memory.         Objective:   Physical Exam  amb wf nad with moderate  voice fatigue   11/05/2013         184 > 11/25/2013   190 > 12/23/2013  190 > 07/16/2014 174 Wt Readings from Last 3 Encounters:  10/04/13 188 lb 3.2 oz (85.367 kg)  05/01/12 192 lb (87.091 kg)  05/01/12 192 lb (87.091 kg)      HEENT: nl dentition, turbinates, and orophanx. Nl external ear canals without cough reflex   NECK :  without JVD/Nodes/TM/ nl carotid upstrokes bilaterally   LUNGS:  Diffuse  bilateral insp squeaks and pops with cough on every deep insp /no wheeze on exp  CV:  RRR  no s3 or murmur or increase in P2, no edema   ABD:  soft and nontender with nl excursion in the supine position. No bruits or organomegaly, bowel sounds nl  MS:  warm without deformities, calf tenderness, cyanosis or clubbing  SKIN: warm  and dry without lesions       CXR  07/16/2014 :  There is bilateral interstitial thickening most severe at the lung  bases concerning for chronic interstitial lung disease with  fibrosis. There is no focal parenchymal opacity, pleural effusion,  or pneumothorax. The heart and mediastinal contours are  unremarkable.       Lab Results  Component Value Date   ESRSEDRATE 32* 07/16/2014    Lab Results  Component Value Date  WBC 12.1* 07/16/2014   HGB 15.3* 07/16/2014   HCT 45.8 07/16/2014   MCV 91.2 07/16/2014   PLT 278.0 07/16/2014   No Eos       Assessment & Plan:

## 2014-07-16 NOTE — Patient Instructions (Addendum)
Prednisone 10 mg take  4 each am x 2 days,   2 each am x 2 days,  1 each am x 2 days and stop   Take delsym two tsp every 12 hours and supplement if needed with  tramadol 50 mg up to 2 every 4 hours to suppress the urge to cough. Swallowing water or using ice chips/non mint and menthol containing candies (such as lifesavers or sugarless jolly ranchers) are also effective.  You should rest your voice and avoid activities that you know make you cough.  Once you have eliminated the cough for 3 straight days try reducing the tramadol first,  then the delsym as tolerated.       Pantoprazole (protonix) 40 mg   Take 30-60 min before first meal of the day and Pepcid ac 20 mg after supper until return to office - this is the best way to tell whether stomach acid is contributing to your problem.    GERD (REFLUX)  is an extremely common cause of respiratory symptoms, many times with no significant heartburn at all.    It can be treated with medication, but also with lifestyle changes including avoidance of late meals, excessive alcohol, smoking cessation, and avoid fatty foods, chocolate, peppermint, colas, red wine, and acidic juices such as orange juice.  NO MINT OR MENTHOL PRODUCTS SO NO COUGH DROPS  USE SUGARLESS CANDY INSTEAD (jolley ranchers or Stover's)  NO OIL BASED VITAMINS - use powdered substitutes.  Please remember to go to the lab and x-ray department downstairs for your tests - we will call you with the results when they are available.    See Tammy NP w/in 2 weeks with all your medications, even over the counter meds, separated in two separate bags, the ones you take no matter what vs the ones you stop once you feel better and take only as needed when you feel you need them.   Tammy  will generate for you a new user friendly medication calendar that will put us all on the same page re: your medication use.     Without this process, it simply isn't possible to assure that we are providing   your outpatient care  with  the attention to detail we feel you deserve.   If we cannot assure that you're getting that kind of care,  then we cannot manage your problem effectively from this clinic.  Once you have seen Tammy and we are sure that we're all on the same page with your medication use she will arrange follow up with me.

## 2014-07-16 NOTE — Progress Notes (Signed)
Quick Note:  Spoke with pt and notified of results per Dr. Wert. Pt verbalized understanding and denied any questions.  ______ 

## 2014-07-17 NOTE — Assessment & Plan Note (Addendum)
See cxr 09/18/13 - rec avoid all macrodantin exp 12/23/2013  - recurrent exposure last in 06/2014 >cxr much worse 07/16/14 with ESR 32 > started short course prednisone   DDx for pulmonary fibrosis  includes idiopathic pulmonary fibrosis, pulmonary fibrosis associated with rheumatologic diseases (which have a relatively benign course in most cases) , adverse effect from  drugs such as chemotherapy or amiodarone or macrodantin exposure, nonspecific interstitial pneumonia which is typically steroid responsive, and chronic hypersensitivity pneumonitis.    Have labeled chart as allergic to macrodantin and asked her again to avoid it at all costs as there are plenty of alternatives  Also, Use of PPI is associated with improved survival time and with decreased radiologic fibrosis per King's study published in [facility] vol 184 p1390.  Dec 2011  This may not be cause and effect, but given how universally unhelpful all the otherstudy drugs have been for pf,   rec cont  rx ppi / diet/ lifestyle modification.

## 2014-07-17 NOTE — Progress Notes (Signed)
Quick Note:  Spoke with pt and notified of results per Dr. Wert. Pt verbalized understanding and denied any questions.  ______ 

## 2014-07-17 NOTE — Assessment & Plan Note (Addendum)
Not really clear that any of her symptoms are asthma related despite reported wheezing.   Ok to continue singulari and prn symbicort for now  The proper method of use, as well as anticipated side effects, of a metered-dose inhaler are discussed and demonstrated to the patient. Improved effectiveness after extensive coaching during this visit to a level of approximately  75%

## 2014-07-30 ENCOUNTER — Ambulatory Visit: Payer: Medicare Other | Admitting: Internal Medicine

## 2014-08-01 ENCOUNTER — Encounter: Payer: Self-pay | Admitting: Adult Health

## 2014-08-01 ENCOUNTER — Ambulatory Visit (INDEPENDENT_AMBULATORY_CARE_PROVIDER_SITE_OTHER): Payer: Medicare Other | Admitting: Adult Health

## 2014-08-01 VITALS — BP 128/72 | HR 73 | Ht 62.0 in | Wt 172.0 lb

## 2014-08-01 DIAGNOSIS — J704 Drug-induced interstitial lung disorders, unspecified: Secondary | ICD-10-CM

## 2014-08-01 NOTE — Progress Notes (Signed)
Subjective:    Patient ID: Casey Freeman, female    DOB: January 25, 1936  MRN: [id number]   Brief patient profile:   [id number]  yowf never smoker with h/o watery rhinitis/ cough x sev months typically in fall since around 2009 then new onset sob and cough intially dx as bronchitis late 2013  rx zpak and cough syrup and better but  Then newly  started on prn albuterol for wheezing rarely needed until Aug 2014  referred 10/04/2013 by Dr Tiburcio PeaHarris for progressively worse  Wheeze/ cough  despite adding symbicort.   History of Present Illness  10/04/2013 1st [facility] office visit/ Wert cc daily sob/ cough x 4-5  m assoc with variable sob to point where has trouble sometimes with adls and even sometimes sob at rest if coughing assoc with hoarseness, dry cough and subwheeze day > night. rec Stop linsinopril  benicar 20/12.5 one daily  Try prilosec 20mg   Take 30-60 min before first meal of the day and Pepcid 20 mg one bedtime until cough is completely gone for at least a week without the need for cough suppression Best cough medication is delsym GERD diet  Prednisone 10 mg take  4 each am x 2 days,   2 each am x 2 days,  1 each am x 2 days and stop  Only use your albuterol as needed    11/05/2013 f/u ov/Wert re: cough since Fall of 2014  Chief Complaint  Patient presents with  . Follow-up    Pt states that her cough, SOB and wheezing gradually worse since her last visit. Cough is prod with minimal yellow to clear sputum. She is using proair approx 3 x per wk.   if not coughing Sob only with exertion like getting in a real hurry or a flight of steps, much better p last rx with prednisone - cough worse day than night rec Prednisone 10 mg take  4 each am x 2 days,   2 each am x 2 days,  1 each am x 2 days and stop  Dulera 100  Take 2 puffs first thing in am and then another 2 puffs about 12 hours later until you return Work on inhaler technique:   Continue Prilosec 20mg   Take 30-60 min before first meal  of the day and Pepcid 20 mg one bedtime and chlortrimeton 4mg  one at bedtime  Take delsym two tsp every 12 hours and supplement if needed with  tramadol 50 mg up to 2 every 4 hours   11/25/2013 f/u ov/Wert re:  Cough since Aug 2014 resolved on dulera 100 2bid p rx with pred Chief Complaint  Patient presents with  . Follow-up    Cough has resolved as of a wk ago. She did notice minimal wheeze this am.  Her breathing has improved and back to her normal baseline.   no more tramadol,  Rare proair need.Not limited by breathing from desired activities   rec Finish up your benicar and micardis 80/12.5 one half daily Stop dulera and start symbicort 160 2 pffs first thing am x one month Ok to wean off the acid suppression and only if needed for acid or coughing.    12/23/2013 f/u ov/Wert re: chronic cough/ ok off symbiocort / off acid /on  micardis Chief Complaint  Patient presents with  . Follow-up    Pt reports her cough is much improved and her breathing is doing well. Has not used any inhalers in the past wk.  Not limited by breathing from desired activities  - no need for saba or cough suppression rec Return for any decline in exercise tolerance or a cough for more than 3 weeks   07/16/2014  Acute  ov/Wert re: recurrent cough on ppi ac one daily  Chief Complaint  Patient presents with  . Acute Visit    Pt c/o increased cough with wheezing for the past 10 days.  Cough is prod with minimal to moderate thick, yellow sputum.   She also c/o increased SOB- using proair about 2 x per day.   since previously gradually worse with cough mostly daytime clear to yellow and sp at least  one course of macrodantin x  early Sept and multiple visits for "bronchitis" no better on various abx Cough to point of gagging, sob no better with proair and mostly with coughing >>pred taper , delsym and tramadol rx   08/01/2014 Follow and Med review  Patient returns for two-week follow-up for cough Patient was  treated with a prednisone taper last visit along with Delsym and tramadol for cough control. Patient does report that she has improved however. Cough is not really went away. Patient does have a history of prolonged Macrodantin use. Her chest x-ray does show bilateral interstitial thickening, most severe at the lung bases, concerning for chronic interstitial lung disease. Patient was instructed not to use Macrodantin any longer. Her sedimentation rate was 32. She denies any hemoptysis, orthopnea, PND or leg swelling.  We reviewed all her medications and organize them into a medication count with patient education It appears she is taking her medications correctly.    Current Medications, Allergies, Complete Past Medical History, Past Surgical History, Family History, and Social History were reviewed in [facility] record.  ROS  The following are not active complaints unless bolded sore throat, dysphagia, dental problems, itching, sneezing,  nasal congestion or excess/ purulent secretions, ear ache,   fever, chills, sweats, unintended wt loss, pleuritic or exertional cp, hemoptysis,  orthopnea pnd or leg swelling, presyncope, palpitations, heartburn, abdominal pain, anorexia, nausea, vomiting, diarrhea  or change in bowel or urinary habits, change in stools or urine, dysuria,hematuria,  rash, arthralgias, visual complaints, headache, numbness weakness or ataxia or problems with walking or coordination,  change in mood/affect or memory.         Objective:   Physical Exam  amb wf nad with moderate  voice fatigue   11/05/2013         184 > 11/25/2013   190 > 12/23/2013  190 > 07/16/2014 174  >172 08/01/2014   HEENT: nl dentition, turbinates, and orophanx. Nl external ear canals without cough reflex   NECK :  without JVD/Nodes/TM/ nl carotid upstrokes bilaterally   LUNGS:  Diffuse  bilateral insp squeaks and pops with cough on every deep insp /no wheeze on exp  CV:  RRR   no s3 or murmur or increase in P2, no edema   ABD:  soft and nontender with nl excursion in the supine position. No bruits or organomegaly, bowel sounds nl  MS:  warm without deformities, calf tenderness, cyanosis or clubbing  SKIN: warm and dry without lesions       CXR  07/16/2014 :  There is bilateral interstitial thickening most severe at the lung  bases concerning for chronic interstitial lung disease with  fibrosis. There is no focal parenchymal opacity, pleural effusion,  or pneumothorax. The heart and mediastinal contours are  unremarkable.  Lab Results  Component Value Date   ESRSEDRATE 32* 07/16/2014    Lab Results  Component Value Date   WBC 12.1* 07/16/2014   HGB 15.3* 07/16/2014   HCT 45.8 07/16/2014   MCV 91.2 07/16/2014   PLT 278.0 07/16/2014   No Eos       Assessment & Plan:

## 2014-08-01 NOTE — Progress Notes (Signed)
Chart and ov note reviewed/ agree with a/p 

## 2014-08-01 NOTE — Patient Instructions (Signed)
We are setting you up for a CT chest (High Resolution)  Do not take Macrodantin -we have placed this on your allergy list.   Follow med calendar closely and bring to each visit.   Take delsym two tsp every 12 hours and supplement if needed with  tramadol 50 mg up to 2 every 4 hours to suppress the urge to cough. Swallowing water or using ice chips/non mint and menthol containing candies (such as lifesavers or sugarless jolly ranchers) are also effective.  You should rest your voice and avoid activities that you know make you cough.  Once you have eliminated the cough for 3 straight days try reducing the tramadol first,  then the delsym as tolerated.      Pantoprazole (protonix) 40 mg   Take 30-60 min before first meal of the day and Pepcid ac 20 mg after supper until return to office - this is the best way to tell whether stomach acid is contributing to your problem.    GERD (REFLUX)  is an extremely common cause of respiratory symptoms, many times with no significant heartburn at all.    It can be treated with medication, but also with lifestyle changes including avoidance of late meals, excessive alcohol, smoking cessation, and avoid fatty foods, chocolate, peppermint, colas, red wine, and acidic juices such as orange juice.  NO MINT OR MENTHOL PRODUCTS SO NO COUGH DROPS  USE SUGARLESS CANDY INSTEAD (jolley ranchers or Stover's)  NO OIL BASED VITAMINS - use powdered substitutes.  Follow up in 4 weeks with Dr. Sherene SiresWert  And As needed

## 2014-08-01 NOTE — Assessment & Plan Note (Addendum)
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 Macrodantin  Patient is to avoid any future Macrodantin use She has completed a short course of prednisone We'll set patient up for a CT chest high resolution Patient's medications were reviewed today and patient education was given. Computerized medication calendar was adjusted/completed   Plan  We are setting you up for a CT chest (High Resolution)  Do not take Macrodantin -we have placed this on your allergy list.   Follow med calendar closely and bring to each visit.   Take delsym two tsp every 12 hours and supplement if needed with  tramadol 50 mg up to 2 every 4 hours to suppress the urge to cough. Swallowing water or using ice chips/non mint and menthol containing candies (such as lifesavers or sugarless jolly ranchers) are also effective.  You should rest your voice and avoid activities that you know make you cough.  Once you have eliminated the cough for 3 straight days try reducing the tramadol first,  then the delsym as tolerated.      Pantoprazole (protonix) 40 mg   Take 30-60 min before first meal of the day and Pepcid ac 20 mg after supper until return to office - this is the best way to tell whether stomach acid is contributing to your problem.    GERD (REFLUX)  is an extremely common cause of respiratory symptoms, many times with no significant heartburn at all.    It can be treated with medication, but also with lifestyle changes including avoidance of late meals, excessive alcohol, smoking cessation, and avoid fatty foods, chocolate, peppermint, colas, red wine, and acidic juices such as orange juice.  NO MINT OR MENTHOL PRODUCTS SO NO COUGH DROPS  USE SUGARLESS CANDY INSTEAD (jolley ranchers or Stover's)  NO OIL BASED VITAMINS - use powdered substitutes.  Follow up in 4 weeks with Dr. Sherene SiresWert  And As needed

## 2014-08-06 ENCOUNTER — Ambulatory Visit (INDEPENDENT_AMBULATORY_CARE_PROVIDER_SITE_OTHER)
Admission: RE | Admit: 2014-08-06 | Discharge: 2014-08-06 | Disposition: A | Discharge status: Home / Self Care | Payer: Medicare Other | Source: Ambulatory Visit | Attending: Adult Health | Admitting: Adult Health

## 2014-08-06 DIAGNOSIS — J704 Drug-induced interstitial lung disorders, unspecified: Secondary | ICD-10-CM

## 2014-08-08 NOTE — Progress Notes (Signed)
Quick Note:  Pt already scheduled for ov w/ MW on 11.27.15 ______

## 2014-08-18 ENCOUNTER — Ambulatory Visit (INDEPENDENT_AMBULATORY_CARE_PROVIDER_SITE_OTHER): Payer: Medicare Other | Admitting: Internal Medicine

## 2014-08-18 ENCOUNTER — Encounter: Payer: Self-pay | Admitting: Internal Medicine

## 2014-08-18 ENCOUNTER — Telehealth: Payer: Self-pay | Admitting: Internal Medicine

## 2014-08-18 VITALS — BP 122/80 | HR 107 | Ht 62.0 in | Wt 171.2 lb

## 2014-08-18 DIAGNOSIS — J704 Drug-induced interstitial lung disorders, unspecified: Secondary | ICD-10-CM

## 2014-08-18 DIAGNOSIS — J45909 Unspecified asthma, uncomplicated: Secondary | ICD-10-CM

## 2014-08-18 MED ORDER — PREDNISONE 10 MG PO TABS
ORAL_TABLET | ORAL | Status: DC
Start: 2014-08-18 — End: 2014-08-22

## 2014-08-18 NOTE — Telephone Encounter (Signed)
Will need ov to regroup with all active meds in hand

## 2014-08-18 NOTE — Patient Instructions (Signed)
Restart symbicort 160 Take 2 puffs first thing in am and then another 2 puffs about 12 hours later.   Work on inhaler technique:  relax and gently blow all the way out then take a nice smooth deep breath back in, triggering the inhaler at same time you start breathing in.  Hold for up to 5 seconds if you can.  Rinse and gargle with water when done     Prednisone 10 mg take  4 each am x 2 days,   2 each am x 2 days,  1 each am x 2 days and stop   See calendar for specific medication instructions and bring it back for each and every office visit for every healthcare provider you see.  Without it,  you may not receive the best quality medical care that we feel you deserve.  You will note that the calendar groups together  your maintenance  medications that are timed at particular times of the day.  Think of this as your checklist for what your doctor has instructed you to do until your next evaluation to see what benefit  there is  to staying on a consistent group of medications intended to keep you well.  The other group at the bottom is entirely up to you to use as you see fit  for specific symptoms that may arise between visits that require you to treat them on an as needed basis.  Think of this as your action plan or "what if" list.   Separating the top medications from the bottom group is fundamental to providing you adequate care going forward.

## 2014-08-18 NOTE — Telephone Encounter (Signed)
Pt c/o increased cough and wheezing x 1 week Pt has followed rec's from last OV-- used Delsym and tramadol as directed. Pt states that her cough is increased with exertion, not having any issues when at rest. Started back on tramadol and delsym PRN x 1 week ago. Pt notes some relief.  Pt requesting any further rec's for her cough, states that she does not seem to be able to go without cough suppression for very long.  Allergies  Allergen Reactions  . Hydrocodone     Makes her dizzy  . Macrodantin [Nitrofurantoin Macrocrystal]     Lung Dz  . Simvastatin Rash   Please advise Dr Sherene SiresWert. Thanks.

## 2014-08-18 NOTE — Progress Notes (Signed)
Subjective:    Patient ID: Casey Freeman, female    DOB: January 25, 1936  MRN: [id number]   Brief patient profile:   [id number]  yowf never smoker with h/o watery rhinitis/ cough x sev months typically in fall since around 2009 then new onset sob and cough intially dx as bronchitis late 2013  rx zpak and cough syrup and better but  Then newly  started on prn albuterol for wheezing rarely needed until Aug 2014  referred 10/04/2013 by Dr Tiburcio PeaHarris for progressively worse  Wheeze/ cough  despite adding symbicort.   History of Present Illness  10/04/2013 1st [facility] office visit/ Obi Scrima cc daily sob/ cough x 4-5  m assoc with variable sob to point where has trouble sometimes with adls and even sometimes sob at rest if coughing assoc with hoarseness, dry cough and subwheeze day > night. rec Stop linsinopril  benicar 20/12.5 one daily  Try prilosec 20mg   Take 30-60 min before first meal of the day and Pepcid 20 mg one bedtime until cough is completely gone for at least a week without the need for cough suppression Best cough medication is delsym GERD diet  Prednisone 10 mg take  4 each am x 2 days,   2 each am x 2 days,  1 each am x 2 days and stop  Only use your albuterol as needed    11/05/2013 f/u ov/Silus Lanzo re: cough since Fall of 2014  Chief Complaint  Patient presents with  . Follow-up    Pt states that her cough, SOB and wheezing gradually worse since her last visit. Cough is prod with minimal yellow to clear sputum. She is using proair approx 3 x per wk.   if not coughing Sob only with exertion like getting in a real hurry or a flight of steps, much better p last rx with prednisone - cough worse day than night rec Prednisone 10 mg take  4 each am x 2 days,   2 each am x 2 days,  1 each am x 2 days and stop  Dulera 100  Take 2 puffs first thing in am and then another 2 puffs about 12 hours later until you return Work on inhaler technique:   Continue Prilosec 20mg   Take 30-60 min before first meal  of the day and Pepcid 20 mg one bedtime and chlortrimeton 4mg  one at bedtime  Take delsym two tsp every 12 hours and supplement if needed with  tramadol 50 mg up to 2 every 4 hours   11/25/2013 f/u ov/Jermaine Tholl re:  Cough since Aug 2014 resolved on dulera 100 2bid p rx with pred Chief Complaint  Patient presents with  . Follow-up    Cough has resolved as of a wk ago. She did notice minimal wheeze this am.  Her breathing has improved and back to her normal baseline.   no more tramadol,  Rare proair need.Not limited by breathing from desired activities   rec Finish up your benicar and micardis 80/12.5 one half daily Stop dulera and start symbicort 160 2 pffs first thing am x one month Ok to wean off the acid suppression and only if needed for acid or coughing.    12/23/2013 f/u ov/Corderro Koloski re: chronic cough/ ok off symbiocort / off acid /on  micardis Chief Complaint  Patient presents with  . Follow-up    Pt reports her cough is much improved and her breathing is doing well. Has not used any inhalers in the past wk.  Not limited by breathing from desired activities  - no need for saba or cough suppression rec Return for any decline in exercise tolerance or a cough for more than 3 weeks   07/16/2014  Acute  ov/Analeigha Nauman re: recurrent cough on ppi ac one daily  Chief Complaint  Patient presents with  . Acute Visit    Pt c/o increased cough with wheezing for the past 10 days.  Cough is prod with minimal to moderate thick, yellow sputum.   She also c/o increased SOB- using proair about 2 x per day.   since previously gradually worse with cough mostly daytime clear to yellow and sp at least  one course of macrodantin x  early Sept and multiple visits for "bronchitis" no better on various abx Cough to point of gagging, sob no better with proair and mostly with coughing >>pred taper,   delsym and tramadol rx   08/01/2014 Follow and Med review  Patient returns for two-week follow-up for cough Patient was  treated with a prednisone taper last visit along with Delsym and tramadol for cough control. Patient does report that she has improved however. Cough is not really gone completely.  Patient does have a history of prolonged Macrodantin use. Her chest x-ray does show bilateral interstitial thickening, most severe at the lung bases, concerning for chronic interstitial lung disease. Patient was instructed not to use Macrodantin any longer. Her sedimentation rate was 32. She denies any hemoptysis, orthopnea, PND or leg swelling.  We reviewed all her medications and organize them into a medication calendar with patient education It appears she is taking her medications correctly    08/18/2014 f/u ov/Logan Vegh re: PF/ cough worse off symbicort  Chief Complaint  Patient presents with  . Follow-up    productive cough yellowish SOB wheezing at times  cough never completely resolved typically p stirring in am Much worse since stopped pred and symbicort and increased need for saba   No obvious day to day or daytime variabilty or assoc  cp or chest tightness, subjective wheeze overt sinus or hb symptoms. No unusual exp hx or h/o childhood pna/ asthma or knowledge of premature birth.  Sleeping ok without nocturnal  or early am exacerbation  of respiratory  c/o's or need for noct saba. Also denies any obvious fluctuation of symptoms with weather or environmental changes or other aggravating or alleviating factors except as outlined above   Current Medications, Allergies, Complete Past Medical History, Past Surgical History, Family History, and Social History were reviewed in [facility] electronic medical record.  ROS  The following are not active complaints unless bolded sore throat, dysphagia, dental problems, itching, sneezing,  nasal congestion or excess/ purulent secretions, ear ache,   fever, chills, sweats, unintended wt loss, pleuritic or exertional cp, hemoptysis,  orthopnea pnd or leg swelling,  presyncope, palpitations, heartburn, abdominal pain, anorexia, nausea, vomiting, diarrhea  or change in bowel or urinary habits, change in stools or urine, dysuria,hematuria,  rash, arthralgias, visual complaints, headache, numbness weakness or ataxia or problems with walking or coordination,  change in mood/affect or memory.                       Objective:   Physical Exam  amb wf nad with moderate  voice fatigue   11/05/2013         184 > 11/25/2013   190 > 12/23/2013  190 > 07/16/2014 174  >172 08/01/2014 > 08/18/2014  171   HEENT:  nl dentition, turbinates, and orophanx. Nl external ear canals without cough reflex   NECK :  without JVD/Nodes/TM/ nl carotid upstrokes bilaterally   LUNGS:  Diffuse  bilateral insp squeaks and pops with cough on every deep insp /bilateral wheeze on exp  CV:  RRR  no s3 or murmur or increase in P2, no edema   ABD:  soft and nontender with nl excursion in the supine position. No bruits or organomegaly, bowel sounds nl  MS:  warm without deformities, calf tenderness, cyanosis or clubbing  SKIN: warm and dry without lesions       CXR  07/16/2014 :  There is bilateral interstitial thickening most severe at the lung  bases concerning for chronic interstitial lung disease with  fibrosis. There is no focal parenchymal opacity, pleural effusion,  or pneumothorax. The heart and mediastinal contours are  unremarkable.       Lab Results  Component Value Date   ESRSEDRATE 32* 07/16/2014    Lab Results  Component Value Date   WBC 12.1* 07/16/2014   HGB 15.3* 07/16/2014   HCT 45.8 07/16/2014   MCV 91.2 07/16/2014   PLT 278.0 07/16/2014   No Eos       Assessment & Plan:

## 2014-08-18 NOTE — Telephone Encounter (Signed)
Pt scheduled for OV with MW today at 145 Aware to bring all meds to appt Nothing further needed.

## 2014-08-21 NOTE — Assessment & Plan Note (Addendum)
-   hfa 75% 11/05/13 > rx dulera 100 2bid > changed to symbicort 160 2bid 11/25/13   DDX of  difficult airways management all start with A and  include Adherence, Ace Inhibitors, Acid Reflux, Active Sinus Disease, Alpha 1 Antitripsin deficiency, Anxiety masquerading as Airways dz,  ABPA,  allergy(esp in young), Aspiration (esp in elderly), Adverse effects of DPI,  Active smokers, plus two Bs  = Bronchiectasis and Beta blocker use..and one C= CHF  Adherence is always the initial "prime suspect" and is a multilayered concern that requires a "trust but verify" approach in every patient - starting with knowing how to use medications, especially inhalers, correctly, keeping up with refills and understanding the fundamental difference between maintenance and prns vs those medications only taken for a very short course and then stopped and not refilled.  - reinforced approp use of med calendar -The proper method of use, as well as anticipated side effects, of a metered-dose inhaler are discussed and demonstrated to the patient. Improved effectiveness after extensive coaching during this visit to a level of approximately  75% so continue symbicort  ? Acid (or non-acid) GERD > always difficult to exclude as up to 75% of pts in some series report no assoc GI/ Heartburn symptoms> rec cont  max (24h)  acid suppression and diet restrictions/ reviewed and instructions given in writing.   ? Allergy > Prednisone 10 mg take  4 each am x 2 days,   2 each am x 2 days,  1 each am x 2 days and stop     Each maintenance medication was reviewed in detail including most importantly the difference between maintenance and as needed and under what circumstances the prns are to be used. This was done in the context of a medication calendar review which provided the patient with a user-friendly unambiguous mechanism for medication administration and reconciliation and provides an action plan for all active problems. It is critical that  this be shown to every doctor  for modification during the office visit if necessary so the patient can use it as a working document.

## 2014-08-21 NOTE — Assessment & Plan Note (Signed)
See cxr 09/18/13 - rec avoid all macrodantin exp 12/23/2013  - recurrent macrodantin exposure last in 06/2014 >cxr much worse 07/16/14 with ESR 32 > started short course prednisone  -med calendar 08/01/2014  -CT chest 08/06/14 >Pulmonary parenchymal pattern of fibrosis, as described above, appears progressive when compared with chest radiographs dating back to 07/27/2009. 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 Findings may be post inflammatory in etiology.  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

## 2014-08-22 NOTE — Addendum Note (Signed)
Addended by: Boone MasterJONES, JESSICA E on: 08/22/2014 10:19 AM   Modules accepted: Orders, Medications

## 2014-08-29 ENCOUNTER — Encounter: Payer: Self-pay | Admitting: Internal Medicine

## 2014-08-29 ENCOUNTER — Ambulatory Visit (INDEPENDENT_AMBULATORY_CARE_PROVIDER_SITE_OTHER): Payer: Medicare Other | Admitting: Internal Medicine

## 2014-08-29 VITALS — BP 120/76 | HR 76 | Ht 62.0 in | Wt 168.0 lb

## 2014-08-29 DIAGNOSIS — J45909 Unspecified asthma, uncomplicated: Secondary | ICD-10-CM

## 2014-08-29 DIAGNOSIS — J704 Drug-induced interstitial lung disorders, unspecified: Secondary | ICD-10-CM

## 2014-08-29 MED ORDER — PANTOPRAZOLE SODIUM 40 MG PO TBEC: 40.0000 mg | DELAYED_RELEASE_TABLET | Freq: Every day | ORAL | Status: DC

## 2014-08-29 MED ORDER — PREDNISONE 10 MG PO TABS: ORAL_TABLET | ORAL | Status: DC

## 2014-08-29 MED ORDER — BUDESONIDE-FORMOTEROL FUMARATE 160-4.5 MCG/ACT IN AERO: INHALATION_SPRAY | RESPIRATORY_TRACT | Status: DC

## 2014-08-29 NOTE — Progress Notes (Signed)
Subjective:    Patient ID: Casey Freeman, female    DOB: [birth year]  MRN: [id number]   Brief patient profile:  [id number]  yowf never smoker with h/o watery rhinitis/ cough x sev months typically in fall since around 2009 then new onset sob and cough intially dx as bronchitis late 2013  rx zpak and cough syrup and better but  Then newly  started on prn albuterol for wheezing rarely needed until Aug 2014  referred 10/04/2013 by Dr Tiburcio PeaHarris for progressively worse  Wheeze/ cough  despite adding symbicort.   History of Present Illness  10/04/2013 1st [facility] office visit/ Casey Freeman cc daily sob/ cough x 4-5  m assoc with variable sob to point where has trouble sometimes with adls and even sometimes sob at rest if coughing assoc with hoarseness, dry cough and subwheeze day > night. rec Stop linsinopril  benicar 20/12.5 one daily  Try prilosec 20mg   Take 30-60 min before first meal of the day and Pepcid 20 mg one bedtime until cough is completely gone for at least a week without the need for cough suppression Best cough medication is delsym GERD diet  Prednisone 10 mg take  4 each am x 2 days,   2 each am x 2 days,  1 each am x 2 days and stop  Only use your albuterol as needed    11/05/2013 f/u ov/Casey Freeman re: cough since Fall of 2014  Chief Complaint  Patient presents with  . Follow-up    Pt states that her cough, SOB and wheezing gradually worse since her last visit. Cough is prod with minimal yellow to clear sputum. She is using proair approx 3 x per wk.   if not coughing Sob only with exertion like getting in a real hurry or a flight of steps, much better p last rx with prednisone - cough worse day than night rec Prednisone 10 mg take  4 each am x 2 days,   2 each am x 2 days,  1 each am x 2 days and stop  Dulera 100  Take 2 puffs first thing in am and then another 2 puffs about 12 hours later until you return Work on inhaler technique:   Continue Prilosec 20mg   Take 30-60 min before first meal  of the day and Pepcid 20 mg one bedtime and chlortrimeton 4mg  one at bedtime  Take delsym two tsp every 12 hours and supplement if needed with  tramadol 50 mg up to 2 every 4 hours   11/25/2013 f/u ov/Casey Freeman re:  Cough since Aug 2014 resolved on dulera 100 2bid p rx with pred Chief Complaint  Patient presents with  . Follow-up    Cough has resolved as of a wk ago. She did notice minimal wheeze this am.  Her breathing has improved and back to her normal baseline.   no more tramadol,  Rare proair need.Not limited by breathing from desired activities   rec Finish up your benicar and then rx micardis 80/12.5 one half daily Stop dulera and start symbicort 160 2 pffs first thing am x one month Ok to wean off the acid suppression and only if needed for acid or coughing.    12/23/2013 f/u ov/Casey Freeman re: chronic cough/ ok off symbiocort / off acid /on  micardis Chief Complaint  Patient presents with  . Follow-up    Pt reports her cough is much improved and her breathing is doing well. Has not used any inhalers in the past  wk.   Not limited by breathing from desired activities  - no need for saba or cough suppression rec Return for any decline in exercise tolerance or a cough for more than 3 weeks   07/16/2014  Acute  ov/Casey Freeman re: recurrent cough on ppi ac one daily/ took another course of macrodantin 06/04/14  Chief Complaint  Patient presents with  . Acute Visit    Pt c/o increased cough with wheezing for the past 10 days.  Cough is prod with minimal to moderate thick, yellow sputum.   She also c/o increased SOB- using proair about 2 x per day.   since previously gradually worse with cough mostly daytime clear to yellow and sp at least  one course of macrodantin x  early Sept and multiple visits for "bronchitis" no better on various abx Cough to point of gagging, sob no better with proair and mostly with coughing >>pred taper,   delsym and tramadol rx   08/01/2014 Follow and Med review  Patient  returns for two-week follow-up for cough Patient was treated with a prednisone taper last visit along with Delsym and tramadol for cough control. Patient does report that she has improved however. Cough is not really gone completely.  Patient does have a history of prolonged Macrodantin use. Her chest x-ray does show bilateral interstitial thickening, most severe at the lung bases, concerning for chronic interstitial lung disease. Patient was instructed not to use Macrodantin any longer. Her sedimentation rate was 32. rec We are setting you up for a CT chest (High Resolution) Do not take Macrodantin -we have placed this on your allergy list.  Follow med calendar closely and bring to each visit.  Take delsym two tsp every 12 hours and supplement if needed with  tramadol 50 mg   08/18/2014 f/u ov/Casey Freeman re: PF/ cough worse off symbicort  Chief Complaint  Patient presents with  . Follow-up    productive cough yellowish SOB wheezing at times  cough never completely resolved, worse typically p stirring in am Much worse since stopped pred and symbicort and increased need for saba  rec Restart symbicort 160 Take 2 puffs first thing in am and then another 2 puffs about 12 hours later.  Work on inhaler technique:    Prednisone 10 mg take  4 each am x 2 days,   2 each am x 2 days,  1 each am x 2 days and stop    08/29/2014 f/u ov/Casey Freeman re: PF/ cough on symbicor 160 2bid Chief Complaint  Patient presents with  . Interstitial Lung Disease    Breathing is improved. Reports SOB and coughing. Cough is productive of yellow mucus. Denies chest tightness or wheezing.   doing better as far as following med calendar, no need for saba and no noct complaints  Did the best while on prednisone and not as well since stopped it   No obvious day to day or daytime variabilty or assoc  cp or chest tightness, subjective wheeze overt sinus or hb symptoms. No unusual exp hx or h/o childhood pna/ asthma or knowledge of  premature birth.  Sleeping ok without nocturnal  or early am exacerbation  of respiratory  c/o's or need for noct saba. Also denies any obvious fluctuation of symptoms with weather or environmental changes or other aggravating or alleviating factors except as outlined above   Current Medications, Allergies, Complete Past Medical History, Past Surgical History, Family History, and Social History were reviewed in [facility] record.  ROS  The following are not active complaints unless bolded sore throat, dysphagia, dental problems, itching, sneezing,  nasal congestion or excess/ purulent secretions, ear ache,   fever, chills, sweats, unintended wt loss, pleuritic or exertional cp, hemoptysis,  orthopnea pnd or leg swelling, presyncope, palpitations, heartburn, abdominal pain, anorexia, nausea, vomiting, diarrhea  or change in bowel or urinary habits, change in stools or urine, dysuria,hematuria,  rash, arthralgias, visual complaints, headache, numbness weakness or ataxia or problems with walking or coordination,  change in mood/affect or memory.                       Objective:   Physical Exam  amb wf nad with moderate  voice fatigue   11/05/2013         184 > 11/25/2013   190 > 12/23/2013  190 > 07/16/2014 174  >172 08/01/2014 > 08/18/2014  171 > 08/29/2014   HEENT: nl dentition, turbinates, and orophanx. Nl external ear canals without cough reflex   NECK :  without JVD/Nodes/TM/ nl carotid upstrokes bilaterally   LUNGS:  Much less pops and squeaks on insp, cough more on insp than expiration, no exp wheeze   CV:  RRR  no s3 or murmur or increase in P2, no edema   ABD:  soft and nontender with nl excursion in the supine position. No bruits or organomegaly, bowel sounds nl  MS:  warm without deformities, calf tenderness, cyanosis or clubbing  SKIN: warm and dry without lesions        HRCT  08/06/14   Pulmonary parenchymal pattern of fibrosis, as described  above, appears progressive when compared with chest radiographs dating back to 07/27/2009. 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 Findings may be post inflammatory in etiology. 2. Minimally enlarged low right paratracheal lymph node can be seen in the setting of interstitial lung disease. 3. Extensive 3 vessel coronary artery calcification.       Lab Results  Component Value Date   ESRSEDRATE 32* 07/16/2014    Lab Results  Component Value Date   WBC 12.1* 07/16/2014   HGB 15.3* 07/16/2014   HCT 45.8 07/16/2014   MCV 91.2 07/16/2014   PLT 278.0 07/16/2014   No Eos       Assessment & Plan:

## 2014-08-29 NOTE — Patient Instructions (Addendum)
Prednisone 10 mg Take 4 for three days 3 for three days 2 for three days 1 for three days and stop   See calendar for specific medication instructions and bring it back for each and every office visit for every healthcare provider you see.  Without it,  you may not receive the best quality medical care that we feel you deserve.  You will note that the calendar groups together  your maintenance  medications that are timed at particular times of the day.  Think of this as your checklist for what your doctor has instructed you to do until your next evaluation to see what benefit  there is  to staying on a consistent group of medications intended to keep you well.  The other group at the bottom is entirely up to you to use as you see fit  for specific symptoms that may arise between visits that require you to treat them on an as needed basis.  Think of this as your action plan or "what if" list.   Separating the top medications from the bottom group is fundamental to providing you adequate care going forward.    Please schedule a follow up office visit in 4 weeks, sooner if needed for pfts (after holidays is fine)

## 2014-08-30 NOTE — Assessment & Plan Note (Signed)
Unusual that she has apparently developed airways dz responsive/dep  on LABA/ICS  at same time as ILD related to ? Macrodantin but for now no change in rx needed.     Each maintenance medication was reviewed in detail including most importantly the difference between maintenance and as needed and under what circumstances the prns are to be used. This was done in the context of a medication calendar review which provided the patient with a user-friendly unambiguous mechanism for medication administration and reconciliation and provides an action plan for all active problems. It is critical that this be shown to every doctor  for modification during the office visit if necessary so the patient can use it as a working document.

## 2014-08-30 NOTE — Assessment & Plan Note (Signed)
See cxr 09/18/13 - rec avoid all macrodantin exp 12/23/2013  - recurrent macrodantin exposure last filled rx  06/2014 [facility] >cxr much worse 07/16/14 with ESR 32 > started short course prednisone  - med calendar 08/01/2014  -CT chest 08/06/14 >Pulmonary parenchymal pattern of fibrosis, as described above, appears progressive when compared with chest radiographs dating back to 07/27/2009. 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 Findings may be post inflammatory in etiology. - 08/29/2014  Walked RA x 3 laps @ 185 ft each stopped due to  End of study mod pace, sats 91%   I had an extended discussion with the patient reviewing all relevant studies completed to date and  lasting 15 to 20 minutes of a 25 minute visit on the following ongoing concerns:   Dx of macrodantin pf is clinical, not certain, but clearly should never receive it again in any form   The goal with a chronic steroid dependent illness is always arriving at the lowest effective dose that controls the disease/symptoms and not accepting a set "formula" which is based on statistics or guidelines that don't always take into account patient  variability or the natural hx of the dz in every individual patient, which may well vary over time.  For now therefore I recommend the patient maintain  Only pred x 12 days then return p holidays for more formal pfts to  Establish points on the curve going forward and make sure the is is not a progressive form of PF which would change the ddx.

## 2014-09-29 ENCOUNTER — Telehealth: Payer: Self-pay | Admitting: *Deleted

## 2014-09-29 ENCOUNTER — Encounter: Payer: Self-pay | Admitting: Internal Medicine

## 2014-09-29 ENCOUNTER — Ambulatory Visit (INDEPENDENT_AMBULATORY_CARE_PROVIDER_SITE_OTHER): Payer: Medicare Other | Admitting: Internal Medicine

## 2014-09-29 VITALS — BP 144/86 | HR 97 | Ht 63.0 in | Wt 174.0 lb

## 2014-09-29 DIAGNOSIS — R059 Cough, unspecified: Secondary | ICD-10-CM

## 2014-09-29 DIAGNOSIS — J704 Drug-induced interstitial lung disorders, unspecified: Secondary | ICD-10-CM

## 2014-09-29 DIAGNOSIS — J45909 Unspecified asthma, uncomplicated: Secondary | ICD-10-CM

## 2014-09-29 DIAGNOSIS — R05 Cough: Secondary | ICD-10-CM

## 2014-09-29 LAB — PULMONARY FUNCTION TEST
DL/VA % pred: 98 %
DL/VA: 4.62 ml/min/mmHg/L
DLCO UNC % PRED: 57 %
DLCO unc: 13.19 ml/min/mmHg
FEF 25-75 Post: 1.82 L/sec
FEF 25-75 Pre: 2.82 L/sec
FEF2575-%Change-Post: -35 %
FEF2575-%PRED-POST: 118 %
FEF2575-%Pred-Pre: 183 %
FEV1-%Change-Post: -10 %
FEV1-%PRED-PRE: 76 %
FEV1-%Pred-Post: 69 %
FEV1-Post: 1.36 L
FEV1-Pre: 1.51 L
FEV1FVC-%Change-Post: -10 %
FEV1FVC-%PRED-PRE: 123 %
FEV6-%Change-Post: -9 %
FEV6-%Pred-Post: 58 %
FEV6-%Pred-Pre: 65 %
FEV6-POST: 1.48 L
FEV6-PRE: 1.64 L
FEV6FVC-%CHANGE-POST: -10 %
FEV6FVC-%PRED-POST: 94 %
FEV6FVC-%PRED-PRE: 105 %
FVC-%CHANGE-POST: 0 %
FVC-%PRED-POST: 62 %
FVC-%Pred-Pre: 61 %
FVC-POST: 1.64 L
FVC-Pre: 1.64 L
Post FEV1/FVC ratio: 83 %
Post FEV6/FVC ratio: 90 %
Pre FEV1/FVC ratio: 92 %
Pre FEV6/FVC Ratio: 100 %
RV % PRED: 59 %
RV: 1.36 L
TLC % pred: 62 %
TLC: 3.05 L

## 2014-09-29 NOTE — Progress Notes (Addendum)
Subjective:    Patient ID: Casey Freeman, female    DOB: [birth year]  MRN: [id number]   Brief patient profile:  [id number]  yowf never smoker with h/o watery rhinitis/ cough x sev months typically in fall since around 2009 then new onset sob and cough intially dx as bronchitis late 2013  rx zpak and cough syrup and better but  Then newly  started on prn albuterol for wheezing rarely needed until Aug 2014  referred 10/04/2013 by Dr Tiburcio PeaHarris for progressively worse  Wheeze/ cough  despite adding symbicort.   History of Present Illness  10/04/2013 1st [facility] office visit/ Casey Freeman cc daily sob/ cough x 4-5  m assoc with variable sob to point where has trouble sometimes with adls and even sometimes sob at rest if coughing assoc with hoarseness, dry cough and subwheeze day > night. rec Stop linsinopril  benicar 20/12.5 one daily  Try prilosec 20mg   Take 30-60 min before first meal of the day and Pepcid 20 mg one bedtime until cough is completely gone for at least a week without the need for cough suppression Best cough medication is delsym GERD diet  Prednisone 10 mg take  4 each am x 2 days,   2 each am x 2 days,  1 each am x 2 days and stop  Only use your albuterol as needed    11/05/2013 f/u ov/Casey Freeman re: cough since Fall of 2014  Chief Complaint  Patient presents with  . Follow-up    Pt states that her cough, SOB and wheezing gradually worse since her last visit. Cough is prod with minimal yellow to clear sputum. She is using proair approx 3 x per wk.   if not coughing Sob only with exertion like getting in a real hurry or a flight of steps, much better p last rx with prednisone - cough worse day than night rec Prednisone 10 mg take  4 each am x 2 days,   2 each am x 2 days,  1 each am x 2 days and stop  Dulera 100  Take 2 puffs first thing in am and then another 2 puffs about 12 hours later until you return Work on inhaler technique:   Continue Prilosec 20mg   Take 30-60 min before first meal  of the day and Pepcid 20 mg one bedtime and chlortrimeton 4mg  one at bedtime  Take delsym two tsp every 12 hours and supplement if needed with  tramadol 50 mg up to 2 every 4 hours   11/25/2013 f/u ov/Casey Freeman re:  Cough since Aug 2014 resolved on dulera 100 2bid p rx with pred Chief Complaint  Patient presents with  . Follow-up    Cough has resolved as of a wk ago. She did notice minimal wheeze this am.  Her breathing has improved and back to her normal baseline.   no more tramadol,  Rare proair need.Not limited by breathing from desired activities   rec Finish up your benicar and then rx micardis 80/12.5 one half daily Stop dulera and start symbicort 160 2 pffs first thing am x one month Ok to wean off the acid suppression and only if needed for acid or coughing.    12/23/2013 f/u ov/Casey Freeman re: chronic cough/ ok off symbiocort / off acid /on  micardis Chief Complaint  Patient presents with  . Follow-up    Pt reports her cough is much improved and her breathing is doing well. Has not used any inhalers in the past  wk.   Not limited by breathing from desired activities  - no need for saba or cough suppression rec Return for any decline in exercise tolerance or a cough for more than 3 weeks   07/16/2014  Acute  ov/Casey Freeman re: recurrent cough on ppi ac one daily/ took another course of macrodantin 06/04/14  Chief Complaint  Patient presents with  . Acute Visit    Pt c/o increased cough with wheezing for the past 10 days.  Cough is prod with minimal to moderate thick, yellow sputum.   She also c/o increased SOB- using proair about 2 x per day.   since previously gradually worse with cough mostly daytime clear to yellow and sp at least  one course of macrodantin x  early Sept and multiple visits for "bronchitis" no better on various abx Cough to point of gagging, sob no better with proair and mostly with coughing >>pred taper,   delsym and tramadol rx   08/01/2014 Follow and Med review  Patient  returns for two-week follow-up for cough Patient was treated with a prednisone taper last visit along with Delsym and tramadol for cough control. Patient does report that she has improved however. Cough is not really gone completely.  Patient does have a history of prolonged Macrodantin use. Her chest x-ray does show bilateral interstitial thickening, most severe at the lung bases, concerning for chronic interstitial lung disease. Patient was instructed not to use Macrodantin any longer. Her sedimentation rate was 32. rec We are setting you up for a CT chest (High Resolution) Do not take Macrodantin -we have placed this on your allergy list.  Follow med calendar closely and bring to each visit.  Take delsym two tsp every 12 hours and supplement if needed with  tramadol 50 mg   08/18/2014 f/u ov/Casey Freeman re: PF/ cough worse off symbicort  Chief Complaint  Patient presents with  . Follow-up    productive cough yellowish SOB wheezing at times  cough never completely resolved, worse typically p stirring in am Much worse since stopped pred and symbicort and increased need for saba  rec Restart symbicort 160 Take 2 puffs first thing in am and then another 2 puffs about 12 hours later.  Work on inhaler technique:    Prednisone 10 mg take  4 each am x 2 days,   2 each am x 2 days,  1 each am x 2 days and stop    08/29/2014 f/u ov/Casey Freeman re: PF/ cough on symbicor 160 2bid Chief Complaint  Patient presents with  . Interstitial Lung Disease    Breathing is improved. Reports SOB and coughing. Cough is productive of yellow mucus. Denies chest tightness or wheezing.   doing better as far as following med calendar, no need for saba and no noct complaints  Did the best while on prednisone and not as well since stopped it  rec Prednisone 10 mg Take 4 for three days 3 for three days 2 for three days 1 for three days and stop  See calendar med cal   09/29/2014 f/u ov/Casey Freeman re: pf ? Macrodantin  Chief  Complaint  Patient presents with  . Follow-up    PFT done today. Pt states that her breathing is much improved. She has not had to use rescue inhaler at all since last visit.   no need for saba on symbicort 160 2bid and has not lost ground since last pred   Not limited by breathing from desired activities  No obvious day to day or daytime variabilty or assoc  cp or chest tightness, subjective wheeze overt sinus or hb symptoms. No unusual exp hx or h/o childhood pna/ asthma or knowledge of premature birth.  Sleeping ok without nocturnal  or early am exacerbation  of respiratory  c/o's or need for noct saba. Also denies any obvious fluctuation of symptoms with weather or environmental changes or other aggravating or alleviating factors except as outlined above   Current Medications, Allergies, Complete Past Medical History, Past Surgical History, Family History, and Social History were reviewed in [facility] electronic medical record.  ROS  The following are not active complaints unless bolded sore throat, dysphagia, dental problems, itching, sneezing,  nasal congestion or excess/ purulent secretions, ear ache,   fever, chills, sweats, unintended wt loss, pleuritic or exertional cp, hemoptysis,  orthopnea pnd or leg swelling, presyncope, palpitations, heartburn, abdominal pain, anorexia, nausea, vomiting, diarrhea  or change in bowel or urinary habits, change in stools or urine, dysuria,hematuria,  rash, arthralgias, visual complaints, headache, numbness weakness or ataxia or problems with walking or coordination,  change in mood/affect or memory.                       Objective:   Physical Exam  amb wf nad  - all smiles today   11/05/2013         184 > 11/25/2013   190 > 12/23/2013  190 > 07/16/2014 174  >172 08/01/2014 > 08/18/2014  171 >  09/29/2014 174   HEENT: nl dentition, turbinates, and orophanx. Nl external ear canals without cough reflex   NECK :  without JVD/Nodes/TM/  nl carotid upstrokes bilaterally   LUNGS:   Min insp crackles bases, no cough on insp or exp  CV:  RRR  no s3 or murmur or increase in P2, no edema   ABD:  soft and nontender with nl excursion in the supine position. No bruits or organomegaly, bowel sounds nl  MS:  warm without deformities, calf tenderness, cyanosis or clubbing  SKIN: warm and dry without lesions        HRCT  08/06/14   Pulmonary parenchymal pattern of fibrosis, as described above, appears progressive when compared with chest radiographs dating back to 07/27/2009. 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 Findings may be post inflammatory in etiology. 2. Minimally enlarged low right paratracheal lymph node can be seen in the setting of interstitial lung disease. 3. Extensive 3 vessel coronary artery calcification.       Lab Results  Component Value Date   ESRSEDRATE 32* 07/16/2014    Lab Results  Component Value Date   WBC 12.1* 07/16/2014   HGB 15.3* 07/16/2014   HCT 45.8 07/16/2014   MCV 91.2 07/16/2014   PLT 278.0 07/16/2014   No Eos       Assessment & Plan:

## 2014-09-29 NOTE — Assessment & Plan Note (Signed)
-  hfa 75% 11/05/13 > rx dulera 100 2bid > changed to symbicort 160 2bid 11/25/13 > improved  All goals of chronic asthma control met including optimal function and elimination of symptoms with minimal need for rescue therapy.  Contingencies discussed in full including contacting this office immediately if not controlling the symptoms using the rule of two's.

## 2014-09-29 NOTE — Telephone Encounter (Signed)
-----   Message from Nyoka CowdenMichael B Wert, MD sent at 09/29/2014 11:46 AM EST ----- Call her drugstore and find out last rx filled for macrodantin and be sure they have it listed as allergy

## 2014-09-29 NOTE — Telephone Encounter (Signed)
Called [facility]  Macrodantin last filled on 06-04-14  Before that it was March 2015  Will forward to MW so he is aware

## 2014-09-29 NOTE — Assessment & Plan Note (Addendum)
See cxr 09/18/13 - rec avoid all macrodantin exp 12/23/2013  - recurrent macrodantin exposure last filled rx  06/2014 [facility] >cxr much worse 07/16/14 with ESR 32 > started short course prednisone  - med calendar 08/01/2014  -CT chest 08/06/14 >Pulmonary parenchymal pattern of fibrosis, as described above, appears progressive when compared with chest radiographs dating back to 07/27/2009. 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 Findings may be post inflammatory in etiology. - 08/29/2014  Walked RA x 3 laps @ 185 ft each stopped due to  End of study mod pace, sats 91% - 09/29/2014  Walked RA x 3 laps @ 185 ft each stopped due to end of study sat 93% and fast pace   - 09/29/2014 PFTs  VC 1.70 (64%) s obst and dlco 57 corrects to 98%  She still has retrictive changes but sats are improving with increased amb pacing which is a very good sign in macrodantin assoc pulm fibrosis- will veriby improvement at 6 m with pfts/ cxr/ walking sats and then plan f/u just prn   Again reviewed how impt it is even in EMR era (esp since EMRs aren't connected between primary care, urology, and EPIC) to let all doctors know she is allergic to nitrofurantoin.    Each maintenance medication was reviewed in detail including most importantly the difference between maintenance and as needed and under what circumstances the prns are to be used. This was done in the context of a medication calendar review which provided the patient with a user-friendly unambiguous mechanism for medication administration and reconciliation and provides an action plan for all active problems. It is critical that this be shown to every doctor  for modification during the office visit if necessary so the patient can use it as a working document.

## 2014-09-29 NOTE — Progress Notes (Signed)
PFT done. Chevelle Coulson, CMA  

## 2014-09-29 NOTE — Patient Instructions (Signed)
No change in medications for now  Please schedule a follow up visit in 6 months but call sooner if needed with pfts and cxr on return with NO Marcodantin/nitrofurantoin/macrobid in meantime

## 2014-12-30 ENCOUNTER — Other Ambulatory Visit: Payer: Self-pay | Admitting: Internal Medicine

## 2015-03-30 ENCOUNTER — Encounter: Payer: Self-pay | Admitting: Internal Medicine

## 2015-03-30 ENCOUNTER — Ambulatory Visit (INDEPENDENT_AMBULATORY_CARE_PROVIDER_SITE_OTHER): Payer: Medicare Other | Admitting: Internal Medicine

## 2015-03-30 VITALS — BP 138/90 | HR 64 | Ht 63.0 in | Wt 176.0 lb

## 2015-03-30 DIAGNOSIS — J704 Drug-induced interstitial lung disorders, unspecified: Secondary | ICD-10-CM

## 2015-03-30 DIAGNOSIS — I10 Essential (primary) hypertension: Secondary | ICD-10-CM

## 2015-03-30 DIAGNOSIS — J45991 Cough variant asthma: Secondary | ICD-10-CM

## 2015-03-30 LAB — PULMONARY FUNCTION TEST
DL/VA % pred: 107 %
DL/VA: 5.03 ml/min/mmHg/L
DLCO UNC % PRED: 66 %
DLCO UNC: 15.31 ml/min/mmHg
FEF 25-75 Post: 2.32 L/sec
FEF 25-75 Pre: 2.44 L/sec
FEF2575-%Change-Post: -4 %
FEF2575-%Pred-Post: 153 %
FEF2575-%Pred-Pre: 161 %
FEV1-%CHANGE-POST: -4 %
FEV1-%PRED-POST: 78 %
FEV1-%Pred-Pre: 81 %
FEV1-POST: 1.53 L
FEV1-Pre: 1.61 L
FEV1FVC-%CHANGE-POST: -2 %
FEV1FVC-%Pred-Pre: 125 %
FEV6-%Change-Post: -1 %
FEV6-%PRED-PRE: 69 %
FEV6-%Pred-Post: 67 %
FEV6-POST: 1.69 L
FEV6-Pre: 1.72 L
FEV6FVC-%CHANGE-POST: 0 %
FEV6FVC-%PRED-POST: 105 %
FEV6FVC-%PRED-PRE: 105 %
FVC-%Change-Post: -1 %
FVC-%Pred-Post: 64 %
FVC-%Pred-Pre: 65 %
FVC-PRE: 1.72 L
FVC-Post: 1.7 L
PRE FEV6/FVC RATIO: 100 %
Post FEV1/FVC ratio: 90 %
Post FEV6/FVC ratio: 100 %
Pre FEV1/FVC ratio: 93 %
RV % PRED: 48 %
RV: 1.1 L
TLC % pred: 62 %
TLC: 3.08 L

## 2015-03-30 NOTE — Patient Instructions (Signed)
Ok to try wean off symbicort ove next sev week s to see if you notice any worse symptoms and if so restart   If you are satisfied with your treatment plan,  let your doctor know and he/she can either refill your medications or you can return here when your prescription runs out.     If in any way you are not 100% satisfied,  please tell us.  If 100% better, tell your friends!  Pulmonary follow up is as needed  - see Tammy NP first for new NP

## 2015-03-30 NOTE — Progress Notes (Signed)
Subjective:    Patient ID: Casey Freeman, female    DOB: September 24, 1936  MRN: [id number]   Brief patient profile:  [id number]  yowf never smoker with h/o watery rhinitis/ cough x sev months typically in fall since around 2009 then new onset sob and cough intially dx as bronchitis late 2013  rx zpak and cough syrup and better but  Then newly  started on prn albuterol for wheezing rarely needed until Aug 2014  referred 10/04/2013 by Dr Tiburcio PeaHarris for progressively worse  Wheeze/ cough  despite adding symbicort.  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    History of Present Illness  10/04/2013 1st [facility] office visit/ Casey Freeman cc daily sob/ cough x 4-5  m assoc with variable sob to point where has trouble sometimes with adls and even sometimes sob at rest if coughing assoc with hoarseness, dry cough and subwheeze day > night. rec Stop linsinopril  benicar 20/12.5 one daily  Try prilosec 20mg   Take 30-60 min before first meal of the day and Pepcid 20 mg one bedtime until cough is completely gone for at least a week without the need for cough suppression Best cough medication is delsym GERD diet  Prednisone 10 mg take  4 each am x 2 days,   2 each am x 2 days,  1 each am x 2 days and stop  Only use your albuterol as needed    11/05/2013 f/u ov/Casey Freeman re: cough since Fall of 2014  Chief Complaint  Patient presents with  . Follow-up    Pt states that her cough, SOB and wheezing gradually worse since her last visit. Cough is prod with minimal yellow to clear sputum. She is using proair approx 3 x per wk.   if not coughing Sob only with exertion like getting in a real hurry or a flight of steps, much better p last rx with prednisone - cough worse day than night rec Prednisone 10 mg take  4 each am x 2 days,   2 each am x 2 days,  1 each am x 2 days and stop  Dulera 100  Take 2 puffs first thing in am and then another 2 puffs about 12 hours later until you  return Work on inhaler technique:   Continue Prilosec 20mg   Take 30-60 min before first meal of the day and Pepcid 20 mg one bedtime and chlortrimeton 4mg  one at bedtime  Take delsym two tsp every 12 hours and supplement if needed with  tramadol 50 mg up to 2 every 4 hours   11/25/2013 f/u ov/Casey Freeman re:  Cough since Aug 2014 resolved on dulera 100 2bid p rx with pred Chief Complaint  Patient presents with  . Follow-up    Cough has resolved as of a wk ago. She did notice minimal wheeze this am.  Her breathing has improved and back to her normal baseline.   no more tramadol,  Rare proair need.Not limited by breathing from desired activities   rec Finish up your benicar and then rx micardis 80/12.5 one half daily Stop dulera and start symbicort 160 2 pffs first thing am x one month Ok to wean off the acid suppression and only if needed for acid or coughing.    12/23/2013 f/u ov/Casey Freeman re: chronic cough/ ok off symbiocort / off acid /on  micardis Chief Complaint  Patient presents with  . Follow-up    Pt reports her cough is much  improved and her breathing is doing well. Has not used any inhalers in the past wk.   Not limited by breathing from desired activities  - no need for saba or cough suppression rec Return for any decline in exercise tolerance or a cough for more than 3 weeks   07/16/2014  Acute  ov/Casey Freeman re: recurrent cough on ppi ac one daily/ took another course of macrodantin 06/04/14  Chief Complaint  Patient presents with  . Acute Visit    Pt c/o increased cough with wheezing for the past 10 days.  Cough is prod with minimal to moderate thick, yellow sputum.   She also c/o increased SOB- using proair about 2 x per day.   since previously gradually worse with cough mostly daytime clear to yellow and sp at least  one course of macrodantin x  early Sept and multiple visits for "bronchitis" no better on various abx Cough to point of gagging, sob no better with proair and mostly with  coughing >>pred taper,   delsym and tramadol rx   08/01/2014 Follow and Med review  Patient returns for two-week follow-up for cough Patient was treated with a prednisone taper last visit along with Delsym and tramadol for cough control. Patient does report that she has improved however. Cough is not really gone completely.  Patient does have a history of prolonged Macrodantin use. Her chest x-ray does show bilateral interstitial thickening, most severe at the lung bases, concerning for chronic interstitial lung disease. Patient was instructed not to use Macrodantin any longer. Her sedimentation rate was 32. rec We are setting you up for a CT chest (High Resolution) Do not take Macrodantin -we have placed this on your allergy list.  Follow med calendar closely and bring to each visit.  Take delsym two tsp every 12 hours and supplement if needed with  tramadol 50 mg   08/18/2014 f/u ov/Casey Freeman re: PF/ cough worse off symbicort  Chief Complaint  Patient presents with  . Follow-up    productive cough yellowish SOB wheezing at times  cough never completely resolved, worse typically p stirring in am Much worse since stopped pred and symbicort and increased need for saba  rec Restart symbicort 160 Take 2 puffs first thing in am and then another 2 puffs about 12 hours later.  Work on inhaler technique:    Prednisone 10 mg take  4 each am x 2 days,   2 each am x 2 days,  1 each am x 2 days and stop    08/29/2014 f/u ov/Casey Freeman re: PF/ cough on symbicor 160 2bid Chief Complaint  Patient presents with  . Interstitial Lung Disease    Breathing is improved. Reports SOB and coughing. Cough is productive of yellow mucus. Denies chest tightness or wheezing.   doing better as far as following med calendar, no need for saba and no noct complaints  Did the best while on prednisone and not as well since stopped it  rec Prednisone 10 mg Take 4 for three days 3 for three days 2 for three days 1 for three  days and stop  See calendar med cal   09/29/2014 f/u ov/Casey Freeman re: pf ? Macrodantin  Chief Complaint  Patient presents with  . Follow-up    PFT done today. Pt states that her breathing is much improved. She has not had to use rescue inhaler at all since last visit.   no need for saba on symbicort 160 2bid and has not lost ground  since last pred  rx Not limited by breathing from desired activities    No obvious day to day or daytime variabilty or assoc cough  cp or chest tightness, subjective wheeze overt sinus or hb symptoms. No unusual exp hx or h/o childhood pna/ asthma or knowledge of premature birth.  Sleeping ok without nocturnal  or early am exacerbation  of respiratory  c/o's or need for noct saba. Also denies any obvious fluctuation of symptoms with weather or environmental changes or other aggravating or alleviating factors except as outlined above   Current Medications, Allergies, Complete Past Medical History, Past Surgical History, Family History, and Social History were reviewed in [employer] [facility] record.  ROS  The following are not active complaints unless bolded sore throat, dysphagia, dental problems, itching, sneezing,  nasal congestion or excess/ purulent secretions, ear ache,   fever, chills, sweats, unintended wt loss, pleuritic or exertional cp, hemoptysis,  orthopnea pnd or leg swelling, presyncope, palpitations, heartburn, abdominal pain, anorexia, nausea, vomiting, diarrhea  or change in bowel or urinary habits, change in stools or urine, dysuria,hematuria,  rash, arthralgias, visual complaints, headache, numbness weakness or ataxia or problems with walking or coordination,  change in mood/affect or memory.                     Objective:   Physical Exam  amb wf nad  - all smiles    11/05/2013         184 > 11/25/2013   190 > 12/23/2013  190 > 07/16/2014 174  >172 08/01/2014 > 08/18/2014  171 >  09/29/2014 174 >  03/30/2015 178   HEENT: nl  dentition, turbinates, and orophanx. Nl external ear canals without cough reflex   NECK :  without JVD/Nodes/TM/ nl carotid upstrokes bilaterally   LUNGS:   Min insp crackles bases, no cough on insp or exp  CV:  RRR  no s3 or murmur or increase in P2, no edema   ABD:  soft and nontender with nl excursion in the supine position. No bruits or organomegaly, bowel sounds nl  MS:  warm without deformities, calf tenderness, cyanosis or clubbing  SKIN: warm and dry without lesions        HRCT  08/06/14   Pulmonary parenchymal pattern of fibrosis, as described above, appears progressive when compared with chest radiographs dating back to 07/27/2009. 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 Findings may be post inflammatory in etiology. 2. Minimally enlarged low right paratracheal lymph node can be seen in the setting of interstitial lung disease. 3. Extensive 3 vessel coronary artery calcification.             Assessment & Plan:   Outpatient Encounter Prescriptions as of 03/30/2015  Medication Sig  . acetaminophen (TYLENOL) 500 MG tablet Per bottle as needed  . albuterol (PROAIR HFA) 108 (90 BASE) MCG/ACT inhaler Inhale 2 puffs into the lungs every 4 (four) hours as needed for wheezing or shortness of breath.   . Ascorbic Acid (VITAMIN C PO) Take 1 tablet by mouth daily.  Marland Kitchen aspirin 81 MG tablet Take 81 mg by mouth 2 (two) times a week. On Tuesdays and Thursdays  . budesonide-formoterol (SYMBICORT) 160-4.5 MCG/ACT inhaler Take 2 puffs first thing in am and then another 2 puffs about 12 hours later.  . chlorpheniramine (CHLOR-TRIMETON) 4 MG tablet 2 at bedtime as needed  . Cholecalciferol (VITAMIN D) 2000 UNITS CAPS Take 1 capsule by  mouth daily.  Marland Kitchen conjugated estrogens (PREMARIN) vaginal cream Place 1 Applicatorful vaginally 2 (two) times a week. On Tuesdays and Thursdays  . dextromethorphan (DELSYM) 30 MG/5ML  liquid 2 tsp every 12 hours as needed  . famotidine (PEPCID) 20 MG tablet Take 20 mg by mouth at bedtime.  . hydrochlorothiazide (HYDRODIURIL) 25 MG tablet Take 25 mg by mouth daily.  Marland Kitchen LORazepam (ATIVAN) 0.5 MG tablet Take 0.5 mg by mouth at bedtime.   . Magnesium 250 MG TABS Take 1 tablet by mouth daily.  . Meth-Hyo-M Bl-Na Phos-Ph Sal (URIBEL) 118 MG CAPS Take 1 capsule by mouth every 8 (eight) hours as needed.   . pantoprazole (PROTONIX) 40 MG tablet TAKE 1 TABLET EVERY DAY (TAKE 30-60 MINUTES BEFORE 1ST MEAL OF THE DAY)  . telmisartan (MICARDIS) 40 MG tablet Take 40 mg by mouth daily.  . traMADol (ULTRAM) 50 MG tablet Take 50-100 mg by mouth every 4 (four) hours as needed.   . TRAVATAN Z 0.004 % SOLN ophthalmic solution Apply 1 drop to eye at bedtime.  Marland Kitchen trimethoprim (TRIMPEX) 100 MG tablet Take 1 tablet by mouth every other day.   Marland Kitchen amoxicillin (AMOXIL) 500 MG tablet 4 capsules prior to dental appt   No facility-administered encounter medications on file as of 03/30/2015.

## 2015-03-30 NOTE — Progress Notes (Signed)
PFT done today. 

## 2015-04-04 ENCOUNTER — Encounter: Payer: Self-pay | Admitting: Internal Medicine

## 2015-04-04 NOTE — Assessment & Plan Note (Signed)
See cxr 09/18/13 - rec avoid all macrodantin exp 12/23/2013  - recurrent macrodantin exposure last filled rx  06/2014 [facility] >cxr much worse 07/16/14 with ESR 32 > started short course prednisone  - med calendar 08/01/2014  -CT chest 08/06/14 >Pulmonary parenchymal pattern of fibrosis, as described above, appears progressive when compared with chest radiographs dating back to 07/27/2009. 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 Findings may be post inflammatory in etiology. - 08/29/2014  Walked RA x 3 laps @ 185 ft each stopped due to  End of study mod pace, sats 91% - 09/29/2014  Walked RA x 3 laps @ 185 ft each stopped due to end of study sat 93% and fast pace   - 09/29/2014 PFTs  VC 1.70 (64%) s obst and dlco 57 corrects to 98% - 03/30/2015   PFTs  VC 1.98 (75%) s obst and dlco 66 corrects to 107   Doing great off macrodantin/ no f/u needed

## 2015-04-04 NOTE — Assessment & Plan Note (Signed)
-   hfa 75% 11/05/13 > rx dulera 100 2bid > changed to symbicort 160 2bid 11/25/13 > improved 09/29/2014 > ok to taper off as of 03/30/15    I had an extended final summary discussion with the patient reviewing all relevant studies completed to date and  lasting 15 to 20 minutes of a 25 minute visit on the following issues:    She is doing great and should accept nothing less. Ok to taper off symbicort to see if/ when notes any cough or sob then resume because I'm not that confident she actually has asthma but if she does then of course should resume it but consider replacing eventually with the 80 2bid version  Pulmonary f/u can be prn

## 2015-04-04 NOTE — Assessment & Plan Note (Addendum)
Changed from acei to arb 10/05/13 due to cough > improved  Adequate control on present rx, reviewed > no change in rx needed  - would not re-challenge with acei in this setting

## 2015-04-23 ENCOUNTER — Other Ambulatory Visit: Payer: Self-pay | Admitting: Internal Medicine

## 2015-05-20 ENCOUNTER — Other Ambulatory Visit: Payer: Self-pay | Admitting: Internal Medicine

## 2015-05-27 ENCOUNTER — Other Ambulatory Visit: Payer: Self-pay

## 2015-05-27 DIAGNOSIS — Z1231 Encounter for screening mammogram for malignant neoplasm of breast: Secondary | ICD-10-CM

## 2015-06-11 ENCOUNTER — Ambulatory Visit (INDEPENDENT_AMBULATORY_CARE_PROVIDER_SITE_OTHER)
Admission: RE | Admit: 2015-06-11 | Discharge: 2015-06-11 | Disposition: A | Discharge status: Home / Self Care | Payer: Medicare Other | Source: Ambulatory Visit | Attending: Internal Medicine | Admitting: Internal Medicine

## 2015-06-11 ENCOUNTER — Ambulatory Visit (INDEPENDENT_AMBULATORY_CARE_PROVIDER_SITE_OTHER): Payer: Medicare Other | Admitting: Internal Medicine

## 2015-06-11 ENCOUNTER — Encounter: Payer: Self-pay | Admitting: Internal Medicine

## 2015-06-11 VITALS — BP 130/80 | HR 79 | Ht 62.0 in | Wt 177.0 lb

## 2015-06-11 DIAGNOSIS — J704 Drug-induced interstitial lung disorders, unspecified: Secondary | ICD-10-CM

## 2015-06-11 DIAGNOSIS — J45991 Cough variant asthma: Secondary | ICD-10-CM | POA: Diagnosis not present

## 2015-06-11 MED ORDER — BUDESONIDE-FORMOTEROL FUMARATE 80-4.5 MCG/ACT IN AERO: INHALATION_SPRAY | RESPIRATORY_TRACT | Status: DC

## 2015-06-11 MED ORDER — PREDNISONE 10 MG PO TABS: ORAL_TABLET | ORAL | Status: DC

## 2015-06-11 NOTE — Assessment & Plan Note (Addendum)
See cxr 09/18/13 - rec avoid all macrodantin exp 12/23/2013  - recurrent macrodantin exposure last filled rx  06/2014 [facility] >cxr much worse 07/16/14 with ESR 32 > started short course prednisone  - med calendar 08/01/2014  -CT chest 08/06/14 >Pulmonary parenchymal pattern of fibrosis, as described above, appears progressive when compared with chest radiographs dating back to 07/27/2009. 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 Findings may be post inflammatory in etiology. - 08/29/2014  Walked RA x 3 laps @ 185 ft each stopped due to  End of study mod pace, sats 91% - 09/29/2014  Walked RA x 3 laps @ 185 ft each stopped due to end of study sat 93% and fast pace   - 09/29/2014 PFTs  VC 1.70 (64%) s obst and dlco 57 corrects to 98% - 03/30/2015   PFTs  VC 1.98 (75%) s obst and dlco 66 corrects to 107   Not convinced this is flare of PF/ will f/u in 2 weeks to regroup

## 2015-06-11 NOTE — Progress Notes (Signed)
Subjective:    Patient ID: Casey Freeman, female    DOB: September 24, 1936  MRN: [id number]   Brief patient profile:  [id number]  yowf never smoker with h/o watery rhinitis/ cough x sev months typically in fall since around 2009 then new onset sob and cough intially dx as bronchitis late 2013  rx zpak and cough syrup and better but  Then newly  started on prn albuterol for wheezing rarely needed until Aug 2014  referred 10/04/2013 by Dr Tiburcio PeaHarris for progressively worse  Wheeze/ cough  despite adding symbicort.  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    History of Present Illness  10/04/2013 1st [facility] office visit/ Casey Freeman cc daily sob/ cough x 4-5  m assoc with variable sob to point where has trouble sometimes with adls and even sometimes sob at rest if coughing assoc with hoarseness, dry cough and subwheeze day > night. rec Stop linsinopril  benicar 20/12.5 one daily  Try prilosec 20mg   Take 30-60 min before first meal of the day and Pepcid 20 mg one bedtime until cough is completely gone for at least a week without the need for cough suppression Best cough medication is delsym GERD diet  Prednisone 10 mg take  4 each am x 2 days,   2 each am x 2 days,  1 each am x 2 days and stop  Only use your albuterol as needed    11/05/2013 f/u ov/Casey Freeman re: cough since Fall of 2014  Chief Complaint  Patient presents with  . Follow-up    Pt states that her cough, SOB and wheezing gradually worse since her last visit. Cough is prod with minimal yellow to clear sputum. She is using proair approx 3 x per wk.   if not coughing Sob only with exertion like getting in a real hurry or a flight of steps, much better p last rx with prednisone - cough worse day than night rec Prednisone 10 mg take  4 each am x 2 days,   2 each am x 2 days,  1 each am x 2 days and stop  Dulera 100  Take 2 puffs first thing in am and then another 2 puffs about 12 hours later until you  return Work on inhaler technique:   Continue Prilosec 20mg   Take 30-60 min before first meal of the day and Pepcid 20 mg one bedtime and chlortrimeton 4mg  one at bedtime  Take delsym two tsp every 12 hours and supplement if needed with  tramadol 50 mg up to 2 every 4 hours   11/25/2013 f/u ov/Casey Freeman re:  Cough since Aug 2014 resolved on dulera 100 2bid p rx with pred Chief Complaint  Patient presents with  . Follow-up    Cough has resolved as of a wk ago. She did notice minimal wheeze this am.  Her breathing has improved and back to her normal baseline.   no more tramadol,  Rare proair need.Not limited by breathing from desired activities   rec Finish up your benicar and then rx micardis 80/12.5 one half daily Stop dulera and start symbicort 160 2 pffs first thing am x one month Ok to wean off the acid suppression and only if needed for acid or coughing.    12/23/2013 f/u ov/Casey Freeman re: chronic cough/ ok off symbiocort / off acid /on  micardis Chief Complaint  Patient presents with  . Follow-up    Pt reports her cough is much  improved and her breathing is doing well. Has not used any inhalers in the past wk.   Not limited by breathing from desired activities  - no need for saba or cough suppression rec Return for any decline in exercise tolerance or a cough for more than 3 weeks   07/16/2014  Acute  ov/Casey Freeman re: recurrent cough on ppi ac one daily/ took another course of macrodantin 06/04/14  Chief Complaint  Patient presents with  . Acute Visit    Pt c/o increased cough with wheezing for the past 10 days.  Cough is prod with minimal to moderate thick, yellow sputum.   She also c/o increased SOB- using proair about 2 x per day.   since previously gradually worse with cough mostly daytime clear to yellow and sp at least  one course of macrodantin x  early Sept and multiple visits for "bronchitis" no better on various abx Cough to point of gagging, sob no better with proair and mostly with  coughing >>pred taper,   delsym and tramadol rx   08/01/2014 Follow and Med review  Patient returns for two-week follow-up for cough Patient was treated with a prednisone taper last visit along with Delsym and tramadol for cough control. Patient does report that she has improved however. Cough is not really gone completely.  Patient does have a history of prolonged Macrodantin use. Her chest x-ray does show bilateral interstitial thickening, most severe at the lung bases, concerning for chronic interstitial lung disease. Patient was instructed not to use Macrodantin any longer. Her sedimentation rate was 32. rec We are setting you up for a CT chest (High Resolution) Do not take Macrodantin -we have placed this on your allergy list.  Follow med calendar closely and bring to each visit.  Take delsym two tsp every 12 hours and supplement if needed with  tramadol 50 mg   08/18/2014 f/u ov/Casey Freeman re: PF/ cough worse off symbicort  Chief Complaint  Patient presents with  . Follow-up    productive cough yellowish SOB wheezing at times  cough never completely resolved, worse typically p stirring in am Much worse since stopped pred and symbicort and increased need for saba  rec Restart symbicort 160 Take 2 puffs first thing in am and then another 2 puffs about 12 hours later.  Work on inhaler technique:    Prednisone 10 mg take  4 each am x 2 days,   2 each am x 2 days,  1 each am x 2 days and stop    08/29/2014 f/u ov/Casey Freeman re: PF/ cough on symbicor 160 2bid Chief Complaint  Patient presents with  . Interstitial Lung Disease    Breathing is improved. Reports SOB and coughing. Cough is productive of yellow mucus. Denies chest tightness or wheezing.   doing better as far as following med calendar, no need for saba and no noct complaints  Did the best while on prednisone and not as well since stopped it  rec Prednisone 10 mg Take 4 for three days 3 for three days 2 for three days 1 for three  days and stop  See calendar med cal   09/29/2014 f/u ov/Casey Freeman re: pf ? Macrodantin  Chief Complaint  Patient presents with  . Follow-up    PFT done today. Pt states that her breathing is much improved. She has not had to use rescue inhaler at all since last visit.   no need for saba on symbicort 160 2bid and has not lost ground  since last pred  rx Not limited by breathing from desired activities   rec Ok to try wean off symbicort ove next sev week s to see if you notice any worse symptoms and if so restart> worse so gradually worked  Back to 2 bid    06/11/2015  Acute ov/Casey Freeman re:  PF/ ? Cough variant asthma?  Flare  Chief Complaint  Patient presents with  . Acute Visit    Pt c/o increased cough and wheezing for the past 2 wks. Cough is prod with minimal clear sputum.  She is using using rescue inhaler 1-2 x per day.    one week after stopped symb symtoms recurred and one week prior to OV  Started Back on 2 bid and still needing lots of saba Symptoms are day > noct/ cough mostly dry not using tramadol as per med calendar   No obvious day to day or daytime variabilty or  cp or chest tightness, subjective wheeze overt sinus or hb symptoms. No unusual exp hx or h/o childhood pna/ asthma or knowledge of premature birth.  Sleeping ok without nocturnal  or early am exacerbation  of respiratory  c/o's or need for noct saba. Also denies any obvious fluctuation of symptoms with weather or environmental changes or other aggravating or alleviating factors except as outlined above   Current Medications, Allergies, Complete Past Medical History, Past Surgical History, Family History, and Social History were reviewed in [employer] record.  ROS  The following are not active complaints unless bolded sore throat, dysphagia, dental problems, itching, sneezing,  nasal congestion or excess/ purulent secretions, ear ache,   fever, chills, sweats, unintended wt loss, pleuritic or  exertional cp, hemoptysis,  orthopnea pnd or leg swelling, presyncope, palpitations, heartburn, abdominal pain, anorexia, nausea, vomiting, diarrhea  or change in bowel or urinary habits, change in stools or urine, dysuria,hematuria,  rash, arthralgias, visual complaints, headache, numbness weakness or ataxia or problems with walking or coordination,  change in mood/affect or memory.            Objective:   Physical Exam  amb wf nad    11/05/2013  184 > 11/25/2013   190 > 12/23/2013  190 > 07/16/2014 174  >172 08/01/2014 > 08/18/2014  171 >  09/29/2014 174 >  03/30/2015 178 > 06/14/2015  178   HEENT: nl dentition, turbinates, and orophanx. Nl external ear canals without cough reflex   NECK :  without JVD/Nodes/TM/ nl carotid upstrokes bilaterally   LUNGS:   Min insp crackles bases, no cough on insp or exp  CV:  RRR  no s3 or murmur or increase in P2, no edema   ABD:  soft and nontender with nl excursion in the supine position. No bruits or organomegaly, bowel sounds nl  MS:  warm without deformities, calf tenderness, cyanosis or clubbing  SKIN: warm and dry without lesions        HRCT  08/06/14   Pulmonary parenchymal pattern of fibrosis, as described above, appears progressive when compared with chest radiographs dating back to 07/27/2009. 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 Findings may be post inflammatory in etiology. 2. Minimally enlarged low right paratracheal lymph node can be seen in the setting of interstitial lung disease. 3. Extensive 3 vessel coronary artery calcification.   I personally reviewed images and agree with radiology impression as follows:  CXR:   06/11/2015 Slight interval progression of bilaterally increased pulmonary interstitial markings. There  is no evidence of alveolar pneumonia nor CHF.         Assessment & Plan:

## 2015-06-11 NOTE — Patient Instructions (Signed)
symbicort 80 Take 2 puffs first thing in am and then another 2 puffs about 12 hours later.   Prednisone 10 mg take  4 each am x 2 days,   2 each am x 2 days,  1 each am x 2 days and stop   Please remember to go to the  x-ray department downstairs for your tests - we will call you with the results when they are available.  For cough > follow med calendar  For shortness of breath> use proair  As per med calendar   See Tammy NP w/in 2 weeks with all your medications, even over the counter meds, separated in two separate bags, the ones you take no matter what vs the ones you stop once you feel better and take only as needed when you feel you need them.   Tammy  will generate for you a new user friendly medication calendar that will put [place] all on the same page re: your medication use.     Without this process, it simply isn't possible to assure that we are providing  your outpatient care  with  the attention to detail we feel you deserve.   If we cannot assure that you're getting that kind of care,  then we cannot manage your problem effectively from this clinic.  Once you have seen Tammy and we are sure that we're all on the same page with your medication use she will arrange follow up with me.

## 2015-06-14 ENCOUNTER — Encounter: Payer: Self-pay | Admitting: Internal Medicine

## 2015-06-14 NOTE — Assessment & Plan Note (Addendum)
-   hfa 75% 11/05/13 > rx dulera 100 2bid > changed to symbicort 160 2bid 11/25/13 > improved 09/29/2014 > ok to taper off as of 03/30/15 > flared off  DDX of  difficult airways management all start with A and  include Adherence, Ace Inhibitors, Acid Reflux, Active Sinus Disease, Alpha 1 Antitripsin deficiency, Anxiety masquerading as Airways dz,  ABPA,  allergy(esp in young), Aspiration (esp in elderly), Adverse effects of meds,  Active smokers, A bunch of PE's (a small clot burden can't cause this syndrome unless there is already severe underlying pulm or vascular dz with poor reserve) plus two Bs  = Bronchiectasis and Beta blocker use..and one C= CHF   Adherence is always the initial "prime suspect" and is a multilayered concern that requires a "trust but verify" approach in every patient - starting with knowing how to use medications, especially inhalers, correctly, keeping up with refills and understanding the fundamental difference between maintenance and prns vs those medications only taken for a very short course and then stopped and not refilled.  - did not activate action plan on med calendar - The proper method of use, as well as anticipated side effects, of a metered-dose inhaler are discussed and demonstrated to the patient. Improved effectiveness after extensive coaching during this visit to a level of approximately  90% so try symbicort 80 2bid which is less likely to cause cough   ? Acid (or non-acid) GERD > always difficult to exclude as up to 75% of pts in some series report no assoc GI/ Heartburn symptoms> rec continue max (24h)  acid suppression and diet restrictions/ reviewed      ? Allergy/ Prednisone 10 mg take  4 each am x 2 days,   2 each am x 2 days,  1 each am x 2 days and stop / consider trial of singulair  I had an extended discussion with the patient reviewing all relevant studies completed to date and  lasting 15 to 20 minutes of a 25 minute visit      Each maintenance  medication was reviewed in detail including most importantly the difference between maintenance and as needed and under what circumstances the prns are to be used. This was done in the context of a medication calendar review which provided the patient with a user-friendly unambiguous mechanism for medication administration and reconciliation and provides an action plan for all active problems. It is critical that this be shown to every doctor  for modification during the office visit if necessary so the patient can use it as a working document.

## 2015-06-26 ENCOUNTER — Encounter: Payer: Self-pay | Admitting: Adult Health

## 2015-06-26 ENCOUNTER — Ambulatory Visit (INDEPENDENT_AMBULATORY_CARE_PROVIDER_SITE_OTHER): Payer: Medicare Other | Admitting: Adult Health

## 2015-06-26 VITALS — BP 124/80 | HR 86 | Temp 97.7°F | Ht 62.0 in | Wt 179.8 lb

## 2015-06-26 DIAGNOSIS — J704 Drug-induced interstitial lung disorders, unspecified: Secondary | ICD-10-CM

## 2015-06-26 DIAGNOSIS — J45991 Cough variant asthma: Secondary | ICD-10-CM

## 2015-06-26 NOTE — Progress Notes (Signed)
Chart and office note reviewed in detail  > agree with a/p as outlined    

## 2015-06-26 NOTE — Patient Instructions (Signed)
Continue on current regimen  Follow med calendar closely and bring to each visit.  Follow up Dr. Wert  In 3 months and As needed   

## 2015-06-26 NOTE — Progress Notes (Signed)
Subjective:    Patient ID: Casey Freeman, female    DOB: September 24, 1936  MRN: [id number]   Brief patient profile:  [id number]  yowf never smoker with h/o watery rhinitis/ cough x sev months typically in fall since around 2009 then new onset sob and cough intially dx as bronchitis late 2013  rx zpak and cough syrup and better but  Then newly  started on prn albuterol for wheezing rarely needed until Aug 2014  referred 10/04/2013 by Dr Tiburcio PeaHarris for progressively worse  Wheeze/ cough  despite adding symbicort.  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    History of Present Illness  10/04/2013 1st [facility] office visit/ Wert cc daily sob/ cough x 4-5  m assoc with variable sob to point where has trouble sometimes with adls and even sometimes sob at rest if coughing assoc with hoarseness, dry cough and subwheeze day > night. rec Stop linsinopril  benicar 20/12.5 one daily  Try prilosec 20mg   Take 30-60 min before first meal of the day and Pepcid 20 mg one bedtime until cough is completely gone for at least a week without the need for cough suppression Best cough medication is delsym GERD diet  Prednisone 10 mg take  4 each am x 2 days,   2 each am x 2 days,  1 each am x 2 days and stop  Only use your albuterol as needed    11/05/2013 f/u ov/Wert re: cough since Fall of 2014  Chief Complaint  Patient presents with  . Follow-up    Pt states that her cough, SOB and wheezing gradually worse since her last visit. Cough is prod with minimal yellow to clear sputum. She is using proair approx 3 x per wk.   if not coughing Sob only with exertion like getting in a real hurry or a flight of steps, much better p last rx with prednisone - cough worse day than night rec Prednisone 10 mg take  4 each am x 2 days,   2 each am x 2 days,  1 each am x 2 days and stop  Dulera 100  Take 2 puffs first thing in am and then another 2 puffs about 12 hours later until you  return Work on inhaler technique:   Continue Prilosec 20mg   Take 30-60 min before first meal of the day and Pepcid 20 mg one bedtime and chlortrimeton 4mg  one at bedtime  Take delsym two tsp every 12 hours and supplement if needed with  tramadol 50 mg up to 2 every 4 hours   11/25/2013 f/u ov/Wert re:  Cough since Aug 2014 resolved on dulera 100 2bid p rx with pred Chief Complaint  Patient presents with  . Follow-up    Cough has resolved as of a wk ago. She did notice minimal wheeze this am.  Her breathing has improved and back to her normal baseline.   no more tramadol,  Rare proair need.Not limited by breathing from desired activities   rec Finish up your benicar and then rx micardis 80/12.5 one half daily Stop dulera and start symbicort 160 2 pffs first thing am x one month Ok to wean off the acid suppression and only if needed for acid or coughing.    12/23/2013 f/u ov/Wert re: chronic cough/ ok off symbiocort / off acid /on  micardis Chief Complaint  Patient presents with  . Follow-up    Pt reports her cough is much  improved and her breathing is doing well. Has not used any inhalers in the past wk.   Not limited by breathing from desired activities  - no need for saba or cough suppression rec Return for any decline in exercise tolerance or a cough for more than 3 weeks   07/16/2014  Acute  ov/Wert re: recurrent cough on ppi ac one daily/ took another course of macrodantin 06/04/14  Chief Complaint  Patient presents with  . Acute Visit    Pt c/o increased cough with wheezing for the past 10 days.  Cough is prod with minimal to moderate thick, yellow sputum.   She also c/o increased SOB- using proair about 2 x per day.   since previously gradually worse with cough mostly daytime clear to yellow and sp at least  one course of macrodantin x  early Sept and multiple visits for "bronchitis" no better on various abx Cough to point of gagging, sob no better with proair and mostly with  coughing >>pred taper,   delsym and tramadol rx   08/01/2014 Follow and Med review  Patient returns for two-week follow-up for cough Patient was treated with a prednisone taper last visit along with Delsym and tramadol for cough control. Patient does report that she has improved however. Cough is not really gone completely.  Patient does have a history of prolonged Macrodantin use. Her chest x-ray does show bilateral interstitial thickening, most severe at the lung bases, concerning for chronic interstitial lung disease. Patient was instructed not to use Macrodantin any longer. Her sedimentation rate was 32. rec We are setting you up for a CT chest (High Resolution) Do not take Macrodantin -we have placed this on your allergy list.  Follow med calendar closely and bring to each visit.  Take delsym two tsp every 12 hours and supplement if needed with  tramadol 50 mg   08/18/2014 f/u ov/Wert re: PF/ cough worse off symbicort  Chief Complaint  Patient presents with  . Follow-up    productive cough yellowish SOB wheezing at times  cough never completely resolved, worse typically p stirring in am Much worse since stopped pred and symbicort and increased need for saba  rec Restart symbicort 160 Take 2 puffs first thing in am and then another 2 puffs about 12 hours later.  Work on inhaler technique:    Prednisone 10 mg take  4 each am x 2 days,   2 each am x 2 days,  1 each am x 2 days and stop    08/29/2014 f/u ov/Wert re: PF/ cough on symbicor 160 2bid Chief Complaint  Patient presents with  . Interstitial Lung Disease    Breathing is improved. Reports SOB and coughing. Cough is productive of yellow mucus. Denies chest tightness or wheezing.   doing better as far as following med calendar, no need for saba and no noct complaints  Did the best while on prednisone and not as well since stopped it  rec Prednisone 10 mg Take 4 for three days 3 for three days 2 for three days 1 for three  days and stop  See calendar med cal   09/29/2014 f/u ov/Wert re: pf ? Macrodantin  Chief Complaint  Patient presents with  . Follow-up    PFT done today. Pt states that her breathing is much improved. She has not had to use rescue inhaler at all since last visit.   no need for saba on symbicort 160 2bid and has not lost ground  since last pred  rx Not limited by breathing from desired activities   rec Ok to try wean off symbicort ove next sev week s to see if you notice any worse symptoms and if so restart> worse so gradually worked  Back to 2 bid    06/11/2015  Acute ov/Wert re:  PF/ ? Cough variant asthma?  Flare  Chief Complaint  Patient presents with  . Acute Visit    Pt c/o increased cough and wheezing for the past 2 wks. Cough is prod with minimal clear sputum.  She is using using rescue inhaler 1-2 x per day.    one week after stopped symb symtoms recurred and one week prior to OV  Started Back on 2 bid and still needing lots of saba Symptoms are day > noct/ cough mostly dry not using tramadol as per med calendar   >>pred taper , symbicort 80   06/26/2015 Follow up : PF/?Cough variant asthma  Pt returns for 2 week follow up .  Pt seen last ov with flare of cough /asthma Tx w/ prednisone taper  and decreased symbicort 80 .  Feels so much better-100% better.  No ProAir use.  We reviewed all her meds and organized them into a med calendar with pt education  Appears to be taking correctly .  She denies chest pain , orthopnea, edema or fever.  Remains active,does house work independently .    Current Medications, Allergies, Complete Past Medical History, Past Surgical History, Family History, and Social History were reviewed in [employer] record.  ROS  The following are not active complaints unless bolded sore throat, dysphagia, dental problems, itching, sneezing,  nasal congestion or excess/ purulent secretions, ear ache,   fever, chills, sweats,  unintended wt loss, pleuritic or exertional cp, hemoptysis,  orthopnea pnd or leg swelling, presyncope, palpitations, heartburn, abdominal pain, anorexia, nausea, vomiting, diarrhea  or change in bowel or urinary habits, change in stools or urine, dysuria,hematuria,  rash, arthralgias, visual complaints, headache, numbness weakness or ataxia or problems with walking or coordination,  change in mood/affect or memory.            Objective:   Physical Exam  amb wf nad    11/05/2013  184 > 11/25/2013   190 > 12/23/2013  190 > 07/16/2014 174  >172 08/01/2014 > 08/18/2014  171 >  09/29/2014 174 >  03/30/2015 178 > 06/14/2015  178 >179 06/26/2015   HEENT: nl dentition, turbinates, and orophanx. Nl external ear canals without cough reflex   NECK :  without JVD/Nodes/TM/ nl carotid upstrokes bilaterally   LUNGS:   Faint bibasilar crackles   CV:  RRR  no s3 or murmur or increase in P2, no edema   ABD:  soft and nontender with nl excursion in the supine position. No bruits or organomegaly, bowel sounds nl  MS:  warm without deformities, calf tenderness, cyanosis or clubbing  SKIN: warm and dry without lesions        HRCT  08/06/14   Pulmonary parenchymal pattern of fibrosis, as described above, appears progressive when compared with chest radiographs dating back to 07/27/2009. 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 Findings may be post inflammatory in etiology. 2. Minimally enlarged low right paratracheal lymph node can be seen in the setting of interstitial lung disease. 3. Extensive 3 vessel coronary artery calcification.     CXR:   06/11/2015 Slight interval progression of bilaterally increased pulmonary  interstitial markings. There is no evidence of alveolar pneumonia nor CHF.         Assessment & Plan:

## 2015-06-29 ENCOUNTER — Telehealth: Payer: Self-pay | Admitting: Adult Health

## 2015-06-29 MED ORDER — PANTOPRAZOLE SODIUM 40 MG PO TBEC: DELAYED_RELEASE_TABLET | ORAL | Status: DC

## 2015-06-29 MED ORDER — BUDESONIDE-FORMOTEROL FUMARATE 80-4.5 MCG/ACT IN AERO: INHALATION_SPRAY | RESPIRATORY_TRACT | Status: DC

## 2015-06-29 NOTE — Telephone Encounter (Signed)
Spoke with pt. She needs refills on Symbicort and Protonix. These have been sent in. Nothing further was needed.

## 2015-06-30 ENCOUNTER — Ambulatory Visit
Admission: RE | Admit: 2015-06-30 | Discharge: 2015-06-30 | Disposition: A | Discharge status: Home / Self Care | Payer: Medicare Other | Source: Ambulatory Visit

## 2015-06-30 DIAGNOSIS — Z1231 Encounter for screening mammogram for malignant neoplasm of breast: Secondary | ICD-10-CM

## 2015-07-02 NOTE — Assessment & Plan Note (Signed)
Stable without flare   Plan  Continue on current regimen  Follow med calendar closely and bring to each visit Follow up Dr. Sherene Sires  In 3 months and As needed

## 2015-07-02 NOTE — Assessment & Plan Note (Signed)
Doing well  Patient's medications were reviewed today and patient education was given. Computerized medication calendar was adjusted/completed   Plan  Continue on current regimen  Follow med calendar closely and bring to each visit Follow up Dr. Sherene Sires  In 3 months and As needed

## 2015-09-19 ENCOUNTER — Other Ambulatory Visit: Payer: Self-pay | Admitting: Adult Health

## 2015-09-25 ENCOUNTER — Ambulatory Visit (INDEPENDENT_AMBULATORY_CARE_PROVIDER_SITE_OTHER): Payer: Medicare Other | Admitting: Internal Medicine

## 2015-09-25 ENCOUNTER — Encounter: Payer: Self-pay | Admitting: Internal Medicine

## 2015-09-25 VITALS — BP 130/88 | HR 71 | Ht 62.0 in | Wt 178.4 lb

## 2015-09-25 DIAGNOSIS — J704 Drug-induced interstitial lung disorders, unspecified: Secondary | ICD-10-CM

## 2015-09-25 DIAGNOSIS — J45991 Cough variant asthma: Secondary | ICD-10-CM

## 2015-09-25 LAB — NITRIC OXIDE: NITRIC OXIDE: 31

## 2015-09-25 MED ORDER — BUDESONIDE-FORMOTEROL FUMARATE 80-4.5 MCG/ACT IN AERO: INHALATION_SPRAY | RESPIRATORY_TRACT | Status: DC

## 2015-09-25 NOTE — Patient Instructions (Addendum)
Ok to try pepcid 20 mg after bfast and bedtime and stop pantoprazole   If worse cough / wheeze/ congestion > restart the pantoprzole   Ok to leave off the pm symbicort only if doing great   Work on inhaler technique:  relax and gently blow all the way out then take a nice smooth deep breath back in, triggering the inhaler at same time you start breathing in.  Hold for up to 5 seconds if you can. Blow out thru nose. Rinse and gargle with water when done      Please schedule a follow up visit in 4 months but call sooner if needed with pfts on return

## 2015-09-25 NOTE — Progress Notes (Signed)
Subjective:    Patient ID: Casey Freeman, female    DOB: [birth year]    MRN: [id number]   Brief patient profile:  89  yowf never smoker with h/o watery rhinitis/ cough x sev months typically in fall since around 2009 then new onset sob and cough intially dx as bronchitis late 2013  rx zpak and cough syrup and better but  Then newly  started on prn albuterol for wheezing rarely needed until Aug 2014  referred 10/04/2013 by Dr Tiburcio Pea for progressively worse  Wheeze/ cough  despite adding symbicort.  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    History of Present Illness  10/04/2013 1st [facility] office visit/ Wert cc daily sob/ cough x 4-5  m assoc with variable sob to point where has trouble sometimes with adls and even sometimes sob at rest if coughing assoc with hoarseness, dry cough and subwheeze day > night. rec Stop linsinopril  benicar 20/12.5 one daily  Try prilosec   Take 30-60 min before first meal of the day and Pepcid 20 mg one bedtime until cough is completely gone for at least a week without the need for cough suppression Best cough medication is delsym GERD diet  Prednisone 10 mg take  4 each am x 2 days,   2 each am x 2 days,  1 each am x 2 days and stop  Only use your albuterol as needed    11/05/2013 f/u ov/Wert re: cough since Fall of 2014  Chief Complaint  Patient presents with  . Follow-up    Pt states that her cough, SOB and wheezing gradually worse since her last visit. Cough is prod with minimal yellow to clear sputum. She is using proair approx 3 x per wk.   if not coughing Sob only with exertion like getting in a real hurry or a flight of steps, much better p last rx with prednisone - cough worse day than night rec Prednisone 10 mg take  4 each am x 2 days,   2 each am x 2 days,  1 each am x 2 days and stop  Dulera 100  Take 2 puffs first thing in am and then another 2 puffs about 12 hours later until you  return Work on inhaler technique:   Continue Prilosec   Take 30-60 min before first meal of the day and Pepcid 20 mg one bedtime and chlortrimeton  one at bedtime  Take delsym two tsp every 12 hours and supplement if needed with  tramadol 50 mg up to 2 every 4 hours   11/25/2013 f/u ov/Wert re:  Cough since Aug 2014 resolved on dulera 100 2bid p rx with pred Chief Complaint  Patient presents with  . Follow-up    Cough has resolved as of a wk ago. She did notice minimal wheeze this am.  Her breathing has improved and back to her normal baseline.   no more tramadol,  Rare proair need.Not limited by breathing from desired activities   rec Finish up your benicar and then rx micardis 80/12.5 one half daily Stop dulera and start symbicort 160 2 pffs first thing am x one month Ok to wean off the acid suppression and only if needed for acid or coughing.    12/23/2013 f/u ov/Wert re: chronic cough/ ok off symbiocort / off acid /on  micardis Chief Complaint  Patient presents with  . Follow-up    Pt reports her cough  is much improved and her breathing is doing well. Has not used any inhalers in the past wk.   Not limited by breathing from desired activities  - no need for saba or cough suppression rec Return for any decline in exercise tolerance or a cough for more than 3 weeks   07/16/2014  Acute  ov/Wert re: recurrent cough on ppi ac one daily/ took another course of macrodantin 06/04/14  Chief Complaint  Patient presents with  . Acute Visit    Pt c/o increased cough with wheezing for the past 10 days.  Cough is prod with minimal to moderate thick, yellow sputum.   She also c/o increased SOB- using proair about 2 x per day.   since previously gradually worse with cough mostly daytime clear to yellow and sp at least  one course of macrodantin x  early Sept and multiple visits for "bronchitis" no better on various abx Cough to point of gagging, sob no better with proair and mostly with  coughing >>pred taper,   delsym and tramadol rx   08/01/2014 Follow and Med review  Patient returns for two-week follow-up for cough Patient was treated with a prednisone taper last visit along with Delsym and tramadol for cough control. Patient does report that she has improved however. Cough is not really gone completely.  Patient does have a history of prolonged Macrodantin use. Her chest x-ray does show bilateral interstitial thickening, most severe at the lung bases, concerning for chronic interstitial lung disease. Patient was instructed not to use Macrodantin any longer. Her sedimentation rate was 32. rec We are setting you up for a CT chest (High Resolution) Do not take Macrodantin -we have placed this on your allergy list.  Follow med calendar closely and bring to each visit.  Take delsym two tsp every 12 hours and supplement if needed with  tramadol 50 mg   08/18/2014 f/u ov/Wert re: PF/ cough worse off symbicort  Chief Complaint  Patient presents with  . Follow-up    productive cough yellowish SOB wheezing at times  cough never completely resolved, worse typically p stirring in am Much worse since stopped pred and symbicort and increased need for saba  rec Restart symbicort 160 Take 2 puffs first thing in am and then another 2 puffs about 12 hours later.  Work on inhaler technique:    Prednisone 10 mg take  4 each am x 2 days,   2 each am x 2 days,  1 each am x 2 days and stop    08/29/2014 f/u ov/Wert re: PF/ cough on symbicor 160 2bid Chief Complaint  Patient presents with  . Interstitial Lung Disease    Breathing is improved. Reports SOB and coughing. Cough is productive of yellow mucus. Denies chest tightness or wheezing.   doing better as far as following med calendar, no need for saba and no noct complaints  Did the best while on prednisone and not as well since stopped it  rec Prednisone 10 mg Take 4 for three days 3 for three days 2 for three days 1 for three  days and stop  See calendar med cal   09/29/2014 f/u ov/Wert re: pf ? Macrodantin  Chief Complaint  Patient presents with  . Follow-up    PFT done today. Pt states that her breathing is much improved. She has not had to use rescue inhaler at all since last visit.   no need for saba on symbicort 160 2bid and has not  lost ground since last pred  rx Not limited by breathing from desired activities   rec Ok to try wean off symbicort ove next sev week s to see if you notice any worse symptoms and if so restart> worse so gradually worked  Back to 2 bid    06/11/2015  Acute ov/Wert re:  PF/ ? Cough variant asthma?  Flare  Chief Complaint  Patient presents with  . Acute Visit    Pt c/o increased cough and wheezing for the past 2 wks. Cough is prod with minimal clear sputum.  She is using using rescue inhaler 1-2 x per day.    one week after stopped symb symtoms recurred and one week prior to OV  Started Back on 2 bid and still needing lots of saba Symptoms are day > noct/ cough mostly dry not using tramadol as per med calendar   >>pred taper , symbicort 80   06/26/2015 Follow up : PF/?Cough variant asthma  Pt returns for 2 week follow up .  Pt seen last ov with flare of cough /asthma Tx w/ prednisone taper  and decreased symbicort 80 .  Feels so much better-100% better.  No ProAir use.  We reviewed all her meds and organized them into a med calendar with pt education  rec Continue on current regimen  Follow med calendar closely and bring to each visit   09/25/2015  f/u ov/Wert re:  PF with cough variant asthma vs UACS  Chief Complaint  Patient presents with  . Follow-up    Pt states her cough has pretty much resolved. Her breathing is doing well and she rarely uses rescue inhaler.   needed proair with recent uri/ sinus flare but not lately Wants to start cutting down on maint rx as overall feeling much better  No obvious day to day or daytime variability or assoc chronic cough or cp  or chest tightness, subjective wheeze or overt sinus or hb symptoms. No unusual exp hx or h/o childhood pna/ asthma or knowledge of premature birth.  Sleeping ok without nocturnal  or early am exacerbation  of respiratory  c/o's or need for noct saba. Also denies any obvious fluctuation of symptoms with weather or environmental changes or other aggravating or alleviating factors except as outlined above   Current Medications, Allergies, Complete Past Medical History, Past Surgical History, Family History, and Social History were reviewed in [employer] record.  ROS  The following are not active complaints unless bolded sore throat, dysphagia, dental problems, itching, sneezing,  nasal congestion or excess/ purulent secretions, ear ache,   fever, chills, sweats, unintended wt loss, classically pleuritic or exertional cp, hemoptysis,  orthopnea pnd or leg swelling, presyncope, palpitations, abdominal pain, anorexia, nausea, vomiting, diarrhea  or change in bowel or bladder habits, change in stools or urine, dysuria,hematuria,  rash, arthralgias, visual complaints, headache, numbness, weakness or ataxia or problems with walking or coordination,  change in mood/affect or memory.     -               Objective:   Physical Exam  amb wf nad    11/05/2013  184 > 11/25/2013   190 > 12/23/2013  190 > 07/16/2014 174  >172 08/01/2014 > 08/18/2014  171 >  09/29/2014 174 >  03/30/2015 178 > 06/14/2015  178 >  179 06/26/2015 > 09/25/2015 178   HEENT: nl dentition, turbinates, and orophanx. Nl external ear canals without cough reflex   NECK :  without JVD/Nodes/TM/ nl carotid upstrokes bilaterally   LUNGS:   insp and exp rhonchi s cough on insp or exp, a few pops/ squeaks and crackles bilaterally on insp also   CV:  RRR  no s3 or murmur or increase in P2, no edema   ABD:  soft and nontender with nl excursion in the supine position. No bruits or organomegaly, bowel sounds nl  MS:  warm  without deformities, calf tenderness, cyanosis or clubbing  SKIN: warm and dry without lesions                  Assessment & Plan:

## 2015-09-28 ENCOUNTER — Encounter: Payer: Self-pay | Admitting: Internal Medicine

## 2015-09-28 NOTE — Assessment & Plan Note (Signed)
-   hfa 75% 11/05/13 > rx dulera 100 2bid > changed to symbicort 160 2bid 11/25/13 > improved 09/29/2014 > ok to taper off as of 03/30/15 > flared off - Allergy profile 11/05/2013 >  Eos 6.1%  IgE 363 but RAST only pos to dust - NO 09/25/2015  = 31  - 09/25/2015  extensive coaching HFA effectiveness =    75% from a baseline 25% > try off pm symbicort   She wants to try lower/ simpler regimen but I cautioned here that changes may not be immediate and that if condition worsens needs to resume prev rx  Also Discussed the recent press about ppi's in the context of a statistically significant (but questionably clinically relevant) increase in CRI in pts on ppi vs h2's > bottom line is the lowest dose of ppi that controls   gerd is the right dose and if that dose is zero that's fine esp since h2's are cheaper - for now try h2 bid instead   I had an extended discussion with the patient reviewing all relevant studies completed to date and  lasting 15 to 20 minutes of a 25 minute visit    Each maintenance medication was reviewed in detail including most importantly the difference between maintenance and prns and under what circumstances the prns are to be triggered using an action plan format that is not reflected in the computer generated alphabetically organized AVS.    Please see instructions for details which were reviewed in writing and the patient given a copy highlighting the part that I personally wrote and discussed at today's ov.

## 2015-09-28 NOTE — Assessment & Plan Note (Signed)
See cxr 09/18/13 - rec avoid all macrodantin exp 12/23/2013  - recurrent macrodantin exposure last filled rx  06/2014 [facility] >cxr much worse 07/16/14 with ESR 32 > started short course prednisone  - med calendar 08/01/2014  -CT chest 08/06/14 >Pulmonary parenchymal pattern of fibrosis, as described above, appears progressive when compared with chest radiographs dating back to 07/27/2009. 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 Findings may be post inflammatory in etiology. - 08/29/2014  Walked RA x 3 laps @ 185 ft each stopped due to  End of study mod pace, sats 91% - 09/29/2014  Walked RA x 3 laps @ 185 ft each stopped due to end of study sat 93% and fast pace   - 09/29/2014 PFTs  VC 1.70 (64%) s obst and dlco 57 corrects to 98% - 03/30/2015   PFTs  VC 1.98 (75%) s obst and dlco 66 corrects to 107    Clinically much better since last exp to macrodantin and no evidence of progression by pfts

## 2015-12-11 ENCOUNTER — Ambulatory Visit (INDEPENDENT_AMBULATORY_CARE_PROVIDER_SITE_OTHER): Payer: Medicare Other | Admitting: Adult Health

## 2015-12-11 ENCOUNTER — Encounter: Payer: Self-pay | Admitting: Adult Health

## 2015-12-11 ENCOUNTER — Ambulatory Visit (INDEPENDENT_AMBULATORY_CARE_PROVIDER_SITE_OTHER)
Admission: RE | Admit: 2015-12-11 | Discharge: 2015-12-11 | Disposition: A | Discharge status: Home / Self Care | Payer: Medicare Other | Source: Ambulatory Visit | Attending: Adult Health | Admitting: Adult Health

## 2015-12-11 VITALS — BP 120/84 | HR 76 | Temp 97.6°F | Ht 62.0 in | Wt 179.0 lb

## 2015-12-11 DIAGNOSIS — J45991 Cough variant asthma: Secondary | ICD-10-CM | POA: Diagnosis not present

## 2015-12-11 DIAGNOSIS — J704 Drug-induced interstitial lung disorders, unspecified: Secondary | ICD-10-CM | POA: Diagnosis not present

## 2015-12-11 MED ORDER — PREDNISONE 10 MG PO TABS: ORAL_TABLET | ORAL | Status: DC

## 2015-12-11 MED ORDER — BUDESONIDE-FORMOTEROL FUMARATE 80-4.5 MCG/ACT IN AERO: INHALATION_SPRAY | RESPIRATORY_TRACT | Status: DC

## 2015-12-11 MED ORDER — PANTOPRAZOLE SODIUM 40 MG PO TBEC: DELAYED_RELEASE_TABLET | ORAL | Status: DC

## 2015-12-11 MED ORDER — AMOXICILLIN-POT CLAVULANATE 875-125 MG PO TABS: 1.0000 | ORAL_TABLET | Freq: Two times a day (BID) | ORAL | Status: AC

## 2015-12-11 MED ORDER — ALBUTEROL SULFATE HFA 108 (90 BASE) MCG/ACT IN AERS: 2.0000 | INHALATION_SPRAY | RESPIRATORY_TRACT | Status: DC | PRN

## 2015-12-11 NOTE — Patient Instructions (Addendum)
Augmentin 875mg  Twice daily  For 7 days -take with food.  Prednisone taper over next week.  Chest xray today .  Follow med calendar for cough control .  Follow up next month for PFT with Dr. Sherene SiresWert  As planned and .As needed   Please contact office for sooner follow up if symptoms do not improve or worsen or seek emergency care

## 2015-12-11 NOTE — Progress Notes (Signed)
Subjective:    Patient ID: Mauricio PoBronna B Wollard, female    DOB: [birth year]    MRN: [id number]   Brief patient profile:  [id number]  yowf never smoker with h/o watery rhinitis/ cough x sev months typically in fall since around 2009 then new onset sob and cough intially dx as bronchitis late 2013  rx zpak and cough syrup and better but  Then newly  started on prn albuterol for wheezing rarely needed until Aug 2014  referred 10/04/2013 by Dr Tiburcio PeaHarris for progressively worse  Wheeze/ cough  despite adding symbicort.  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    History of Present Illness  10/04/2013 1st [facility] office visit/ Wert cc daily sob/ cough x 4-5  m assoc with variable sob to point where has trouble sometimes with adls and even sometimes sob at rest if coughing assoc with hoarseness, dry cough and subwheeze day > night. rec Stop linsinopril  benicar 20/12.5 one daily  Try prilosec 20mg   Take 30-60 min before first meal of the day and Pepcid 20 mg one bedtime until cough is completely gone for at least a week without the need for cough suppression Best cough medication is delsym GERD diet  Prednisone 10 mg take  4 each am x 2 days,   2 each am x 2 days,  1 each am x 2 days and stop  Only use your albuterol as needed    11/05/2013 f/u ov/Wert re: cough since Fall of 2014  Chief Complaint  Patient presents with  . Follow-up    Pt states that her cough, SOB and wheezing gradually worse since her last visit. Cough is prod with minimal yellow to clear sputum. She is using proair approx 3 x per wk.   if not coughing Sob only with exertion like getting in a real hurry or a flight of steps, much better p last rx with prednisone - cough worse day than night rec Prednisone 10 mg take  4 each am x 2 days,   2 each am x 2 days,  1 each am x 2 days and stop  Dulera 100  Take 2 puffs first thing in am and then another 2 puffs about 12 hours later until you  return Work on inhaler technique:   Continue Prilosec 20mg   Take 30-60 min before first meal of the day and Pepcid 20 mg one bedtime and chlortrimeton 4mg  one at bedtime  Take delsym two tsp every 12 hours and supplement if needed with  tramadol 50 mg up to 2 every 4 hours   11/25/2013 f/u ov/Wert re:  Cough since Aug 2014 resolved on dulera 100 2bid p rx with pred Chief Complaint  Patient presents with  . Follow-up    Cough has resolved as of a wk ago. She did notice minimal wheeze this am.  Her breathing has improved and back to her normal baseline.   no more tramadol,  Rare proair need.Not limited by breathing from desired activities   rec Finish up your benicar and then rx micardis 80/12.5 one half daily Stop dulera and start symbicort 160 2 pffs first thing am x one month Ok to wean off the acid suppression and only if needed for acid or coughing.    12/23/2013 f/u ov/Wert re: chronic cough/ ok off symbiocort / off acid /on  micardis Chief Complaint  Patient presents with  . Follow-up    Pt reports her cough  is much improved and her breathing is doing well. Has not used any inhalers in the past wk.   Not limited by breathing from desired activities  - no need for saba or cough suppression rec Return for any decline in exercise tolerance or a cough for more than 3 weeks   07/16/2014  Acute  ov/Wert re: recurrent cough on ppi ac one daily/ took another course of macrodantin 06/04/14  Chief Complaint  Patient presents with  . Acute Visit    Pt c/o increased cough with wheezing for the past 10 days.  Cough is prod with minimal to moderate thick, yellow sputum.   She also c/o increased SOB- using proair about 2 x per day.   since previously gradually worse with cough mostly daytime clear to yellow and sp at least  one course of macrodantin x  early Sept and multiple visits for "bronchitis" no better on various abx Cough to point of gagging, sob no better with proair and mostly with  coughing >>pred taper,   delsym and tramadol rx   08/01/2014 Follow and Med review  Patient returns for two-week follow-up for cough Patient was treated with a prednisone taper last visit along with Delsym and tramadol for cough control. Patient does report that she has improved however. Cough is not really gone completely.  Patient does have a history of prolonged Macrodantin use. Her chest x-ray does show bilateral interstitial thickening, most severe at the lung bases, concerning for chronic interstitial lung disease. Patient was instructed not to use Macrodantin any longer. Her sedimentation rate was 32. rec We are setting you up for a CT chest (High Resolution) Do not take Macrodantin -we have placed this on your allergy list.  Follow med calendar closely and bring to each visit.  Take delsym two tsp every 12 hours and supplement if needed with  tramadol 50 mg   08/18/2014 f/u ov/Wert re: PF/ cough worse off symbicort  Chief Complaint  Patient presents with  . Follow-up    productive cough yellowish SOB wheezing at times  cough never completely resolved, worse typically p stirring in am Much worse since stopped pred and symbicort and increased need for saba  rec Restart symbicort 160 Take 2 puffs first thing in am and then another 2 puffs about 12 hours later.  Work on inhaler technique:    Prednisone 10 mg take  4 each am x 2 days,   2 each am x 2 days,  1 each am x 2 days and stop    08/29/2014 f/u ov/Wert re: PF/ cough on symbicor 160 2bid Chief Complaint  Patient presents with  . Interstitial Lung Disease    Breathing is improved. Reports SOB and coughing. Cough is productive of yellow mucus. Denies chest tightness or wheezing.   doing better as far as following med calendar, no need for saba and no noct complaints  Did the best while on prednisone and not as well since stopped it  rec Prednisone 10 mg Take 4 for three days 3 for three days 2 for three days 1 for three  days and stop  See calendar med cal   09/29/2014 f/u ov/Wert re: pf ? Macrodantin  Chief Complaint  Patient presents with  . Follow-up    PFT done today. Pt states that her breathing is much improved. She has not had to use rescue inhaler at all since last visit.   no need for saba on symbicort 160 2bid and has not  lost ground since last pred  rx Not limited by breathing from desired activities   rec Ok to try wean off symbicort ove next sev week s to see if you notice any worse symptoms and if so restart> worse so gradually worked  Back to 2 bid    06/11/2015  Acute ov/Wert re:  PF/ ? Cough variant asthma?  Flare  Chief Complaint  Patient presents with  . Acute Visit    Pt c/o increased cough and wheezing for the past 2 wks. Cough is prod with minimal clear sputum.  She is using using rescue inhaler 1-2 x per day.    one week after stopped symb symtoms recurred and one week prior to OV  Started Back on 2 bid and still needing lots of saba Symptoms are day > noct/ cough mostly dry not using tramadol as per med calendar   >>pred taper , symbicort 80   06/26/2015 Follow up : PF/?Cough variant asthma  Pt returns for 2 week follow up .  Pt seen last ov with flare of cough /asthma Tx w/ prednisone taper  and decreased symbicort 80 .  Feels so much better-100% better.  No ProAir use.  We reviewed all her meds and organized them into a med calendar with pt education  rec Continue on current regimen  Follow med calendar closely and bring to each visit   09/25/2015  f/u ov/Wert re:  PF with cough variant asthma vs UACS  Chief Complaint  Patient presents with  . Follow-up    Pt states her cough has pretty much resolved. Her breathing is doing well and she rarely uses rescue inhaler.   needed proair with recent uri/ sinus flare but not lately Wants to start cutting down on maint rx as overall feeling much better >>add pepcid   12/11/2015 Acute OV : PF with cough variant asthma  Pt  presents for an acute office visit.  Complains of  prod cough with a small amount of yellow mucus, chest tightness/congestion, sinus pressure/drainage, SOB with activity and wheezing x 2 week.  Using delsym and tramadol for cough.  Denies any fever, nausea or vomiting.  Says she has really been doing very well up until 2 weeks ago.  Has upcoming PFT next month.  No fever or chills, no hemoptyiss , increased edema or chest pain.  Eating well .       Current Medications, Allergies, Complete Past Medical History, Past Surgical History, Family History, and Social History were reviewed in [employer] record.  ROS  The following are not active complaints unless bolded sore throat, dysphagia, dental problems, itching, sneezing,  nasal congestion or excess/ purulent secretions, ear ache,   fever, chills, sweats, unintended wt loss, classically pleuritic or exertional cp, hemoptysis,  orthopnea pnd or leg swelling, presyncope, palpitations, abdominal pain, anorexia, nausea, vomiting, diarrhea  or change in bowel or bladder habits, change in stools or urine, dysuria,hematuria,  rash, arthralgias, visual complaints, headache, numbness, weakness or ataxia or problems with walking or coordination,  change in mood/affect or memory.     -               Objective:   Physical Exam  amb wf nad    Filed Vitals:   12/11/15 1054  BP: 120/84  Pulse: 76  Temp: 97.6 F (36.4 C)  TempSrc: Oral  Height:  (1.575 m)  Weight: 179 lb (81.194 kg)  SpO2: 93%    11/05/2013  184 > 11/25/2013   190 > 12/23/2013  190 > 07/16/2014 174  >172 08/01/2014 > 08/18/2014  171 >  09/29/2014 174 >  03/30/2015 178 > 06/14/2015  178 >  179 06/26/2015 > 09/25/2015 178 > 179 12/11/2015   HEENT: nl dentition, turbinates, and orophanx. Nl external ear canals without cough reflex   NECK :  without JVD/Nodes/TM/ nl carotid upstrokes bilaterally   LUNGS:   insp and exp rhonchi s cough on insp or exp, a  few pops/ squeaks and crackles bilaterally on insp also   CV:  RRR  no s3 or murmur or increase in P2, tr edema   ABD:  soft and nontender with nl excursion in the supine position. No bruits or organomegaly, bowel sounds nl  MS:  warm without deformities, calf tenderness, cyanosis or clubbing  SKIN: warm and dry without lesions        Pasco Marchitto NP-C  [facility]   12/11/2015          Assessment & Plan:

## 2015-12-11 NOTE — Addendum Note (Signed)
Addended by: Karalee HeightOX, Aliviana Burdell P on: 12/11/2015 11:28 AM   Modules accepted: Orders

## 2015-12-11 NOTE — Progress Notes (Signed)
Chart and office note reviewed in detail  > agree with a/p as outlined    

## 2015-12-11 NOTE — Assessment & Plan Note (Signed)
Flare with bronchitis complicated by underlying PF  chcek  cxr   Plan  Augmentin 875mg  Twice daily  For 7 days -take with food.  Prednisone taper over next week.  Chest xray today .  Follow med calendar for cough control .  Follow up next month for PFT with Dr. Sherene SiresWert  As planned and .As needed   Please contact office for sooner follow up if symptoms do not improve or worsen or seek emergency care

## 2015-12-11 NOTE — Assessment & Plan Note (Signed)
PFT on return next month  cxr today

## 2015-12-11 NOTE — Addendum Note (Signed)
Addended by: Karalee HeightOX, Rosalind Guido P on: 12/11/2015 11:39 AM   Modules accepted: Orders

## 2016-01-25 ENCOUNTER — Other Ambulatory Visit: Payer: Self-pay | Admitting: Internal Medicine

## 2016-01-25 DIAGNOSIS — R0600 Dyspnea, unspecified: Secondary | ICD-10-CM

## 2016-01-26 ENCOUNTER — Ambulatory Visit (INDEPENDENT_AMBULATORY_CARE_PROVIDER_SITE_OTHER): Payer: Medicare Other | Admitting: Internal Medicine

## 2016-01-26 ENCOUNTER — Encounter: Payer: Self-pay | Admitting: Internal Medicine

## 2016-01-26 VITALS — BP 126/74 | HR 50 | Ht 63.0 in | Wt 180.0 lb

## 2016-01-26 DIAGNOSIS — J45991 Cough variant asthma: Secondary | ICD-10-CM

## 2016-01-26 DIAGNOSIS — J704 Drug-induced interstitial lung disorders, unspecified: Secondary | ICD-10-CM | POA: Diagnosis not present

## 2016-01-26 DIAGNOSIS — R0600 Dyspnea, unspecified: Secondary | ICD-10-CM

## 2016-01-26 LAB — PULMONARY FUNCTION TEST
DL/VA % pred: 104 %
DL/VA: 4.89 ml/min/mmHg/L
DLCO COR % PRED: 63 %
DLCO COR: 14.48 ml/min/mmHg
DLCO UNC % PRED: 65 %
DLCO UNC: 15.06 ml/min/mmHg
FEF 25-75 POST: 2.28 L/s
FEF 25-75 PRE: 2.28 L/s
FEF2575-%Change-Post: 0 %
FEF2575-%PRED-PRE: 154 %
FEF2575-%Pred-Post: 155 %
FEV1-%Change-Post: -2 %
FEV1-%PRED-POST: 74 %
FEV1-%Pred-Pre: 76 %
FEV1-Post: 1.44 L
FEV1-Pre: 1.47 L
FEV1FVC-%Change-Post: 1 %
FEV1FVC-%PRED-PRE: 119 %
FEV6-%CHANGE-POST: -2 %
FEV6-%Pred-Post: 65 %
FEV6-%Pred-Pre: 67 %
FEV6-POST: 1.61 L
FEV6-Pre: 1.66 L
FEV6FVC-%Change-Post: 0 %
FEV6FVC-%PRED-POST: 105 %
FEV6FVC-%Pred-Pre: 104 %
FVC-%CHANGE-POST: -3 %
FVC-%PRED-PRE: 64 %
FVC-%Pred-Post: 62 %
FVC-POST: 1.61 L
FVC-PRE: 1.67 L
POST FEV6/FVC RATIO: 100 %
PRE FEV1/FVC RATIO: 88 %
Post FEV1/FVC ratio: 89 %
Pre FEV6/FVC Ratio: 99 %
RV % pred: 68 %
RV: 1.57 L
TLC % PRED: 66 %
TLC: 3.27 L

## 2016-01-26 NOTE — Progress Notes (Signed)
Subjective:    Patient ID: Casey Freeman, female    DOB: [birth year]    MRN: [id number]   Brief patient profile:  [phone]  yowf never smoker with h/o watery rhinitis/ cough x sev months typically in fall since around 2009 then new onset sob and cough intially dx as bronchitis late 2013  rx zpak and cough syrup and better but  Then newly  started on prn albuterol for wheezing rarely needed until Aug 2014  referred 10/04/2013 by Dr Tiburcio PeaHarris for progressively worse  Wheeze/ cough  despite adding symbicort.  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    History of Present Illness  10/04/2013 1st [facility] office visit/ Casey Freeman cc daily sob/ cough x 4-5  m assoc with variable sob to point where has trouble sometimes with adls and even sometimes sob at rest if coughing assoc with hoarseness, dry cough and subwheeze day > night. rec Stop linsinopril  benicar 20/12.5 one daily  Try prilosec 20mg   Take 30-60 min before first meal of the day and Pepcid 20 mg one bedtime until cough is completely gone for at least a week without the need for cough suppression Best cough medication is delsym GERD diet  Prednisone 10 mg take  4 each am x 2 days,   2 each am x 2 days,  1 each am x 2 days and stop  Only use your albuterol as needed    11/05/2013 f/u ov/Casey Freeman re: cough since Fall of 2014  Chief Complaint  Patient presents with  . Follow-up    Pt states that her cough, SOB and wheezing gradually worse since her last visit. Cough is prod with minimal yellow to clear sputum. She is using proair approx 3 x per wk.   if not coughing Sob only with exertion like getting in a real hurry or a flight of steps, much better p last rx with prednisone - cough worse day than night rec Prednisone 10 mg take  4 each am x 2 days,   2 each am x 2 days,  1 each am x 2 days and stop  Dulera 100  Take 2 puffs first thing in am and then another 2 puffs about 12 hours later until you  return Work on inhaler technique:   Continue Prilosec 20mg   Take 30-60 min before first meal of the day and Pepcid 20 mg one bedtime and chlortrimeton 4mg  one at bedtime  Take delsym two tsp every 12 hours and supplement if needed with  tramadol 50 mg up to 2 every 4 hours   11/25/2013 f/u ov/Casey Freeman re:  Cough since Aug 2014 resolved on dulera 100 2bid p rx with pred Chief Complaint  Patient presents with  . Follow-up    Cough has resolved as of a wk ago. She did notice minimal wheeze this am.  Her breathing has improved and back to her normal baseline.   no more tramadol,  Rare proair need.Not limited by breathing from desired activities   rec Finish up your benicar and then rx micardis 80/12.5 one half daily Stop dulera and start symbicort 160 2 pffs first thing am x one month Ok to wean off the acid suppression and only if needed for acid or coughing.    12/23/2013 f/u ov/Casey Freeman re: chronic cough/ ok off symbiocort / off acid /on  micardis Chief Complaint  Patient presents with  . Follow-up    Pt reports her cough  is much improved and her breathing is doing well. Has not used any inhalers in the past wk.   Not limited by breathing from desired activities  - no need for saba or cough suppression rec Return for any decline in exercise tolerance or a cough for more than 3 weeks   07/16/2014  Acute  ov/Casey Freeman re: recurrent cough on ppi ac one daily/ took another course of macrodantin 06/04/14  Chief Complaint  Patient presents with  . Acute Visit    Pt c/o increased cough with wheezing for the past 10 days.  Cough is prod with minimal to moderate thick, yellow sputum.   She also c/o increased SOB- using proair about 2 x per day.   since previously gradually worse with cough mostly daytime clear to yellow and sp at least  one course of macrodantin x  early Sept and multiple visits for "bronchitis" no better on various abx Cough to point of gagging, sob no better with proair and mostly with  coughing >>pred taper,   delsym and tramadol rx   08/01/2014 Follow and Med review  Patient returns for two-week follow-up for cough Patient was treated with a prednisone taper last visit along with Delsym and tramadol for cough control. Patient does report that she has improved however. Cough is not really gone completely.  Patient does have a history of prolonged Macrodantin use. Her chest x-ray does show bilateral interstitial thickening, most severe at the lung bases, concerning for chronic interstitial lung disease. Patient was instructed not to use Macrodantin any longer. Her sedimentation rate was 32. rec We are setting you up for a CT chest (High Resolution) Do not take Macrodantin -we have placed this on your allergy list.  Follow med calendar closely and bring to each visit.  Take delsym two tsp every 12 hours and supplement if needed with  tramadol 50 mg   09/29/2014 f/u ov/Casey Freeman re: pf ? Macrodantin  Chief Complaint  Patient presents with  . Follow-up    PFT done today. Pt states that her breathing is much improved. She has not had to use rescue inhaler at all since last visit.   no need for saba on symbicort 160 2bid and has not lost ground since last pred  rx Not limited by breathing from desired activities   rec Ok to try wean off symbicort ove next sev week s to see if you notice any worse symptoms and if so restart> worse so gradually worked  Back to 2 bid    06/11/2015  Acute ov/Casey Freeman re:  PF/ ? Cough variant asthma?  Flare  Chief Complaint  Patient presents with  . Acute Visit    Pt c/o increased cough and wheezing for the past 2 wks. Cough is prod with minimal clear sputum.  She is using using rescue inhaler 1-2 x per day.    one week after stopped symb symtoms recurred and one week prior to OV  Started Back on 2 bid and still needing lots of saba Symptoms are day > noct/ cough mostly dry not using tramadol as per med calendar   >>pred taper , symbicort 80    06/26/2015 Follow up : PF/?Cough variant asthma  Pt returns for 2 week follow up .  Pt seen last ov with flare of cough /asthma Tx w/ prednisone taper  and decreased symbicort 80 .  Feels so much better-100% better.  No ProAir use.  We reviewed all her meds and organized them into a  med calendar with pt education  rec Continue on current regimen  Follow med calendar closely and bring to each visit   09/25/2015  f/u ov/Casey Freeman re:  PF with cough variant asthma vs UACS  Chief Complaint  Patient presents with  . Follow-up    Pt states her cough has pretty much resolved. Her breathing is doing well and she rarely uses rescue inhaler.   needed proair with recent uri/ sinus flare but not lately Wants to start cutting down on maint rx as overall feeling much better >>add pepcid as maint and stop ppi   12/11/2015 Acute OV : PF with cough variant asthma  Pt presents for an acute office visit.  Complains of  prod cough with a small amount of yellow mucus, chest tightness/congestion, sinus pressure/drainage, SOB with activity and wheezing x 2 week.  Using delsym and tramadol for cough.  Denies any fever, nausea or vomiting.  Says she has really been doing very well up until 2 weeks ago.  rec Augmentin  Twice daily  For 7 days -take with food.  Prednisone taper over next week.      01/26/2016  f/u ov/Casey Freeman re: Vedia Pereyra PF/ ? Cough variant asthma maint rx with symbicort 80 2bid no longer on ppi just pepcid bid  Chief Complaint  Patient presents with  . Follow-up    PFT done today. Pt states her breathing has improved. She has not used albuterol in the past few wks. She c/o some hoarseness today.   doe x vacuuming / sev flights / MMRC1 = can walk nl pace, flat grade, can't hurry or go uphills or steps s sob    No obvious day to day or daytime variability or assoc chronic cough or cp or chest tightness, subjective wheeze or overt sinus or hb symptoms. No unusual exp hx or h/o childhood  pna/ asthma or knowledge of premature birth.  Sleeping ok without nocturnal  or early am exacerbation  of respiratory  c/o's or need for noct saba. Also denies any obvious fluctuation of symptoms with weather or environmental changes or other aggravating or alleviating factors except as outlined above   Current Medications, Allergies, Complete Past Medical History, Past Surgical History, Family History, and Social History were reviewed in [employer] record.  ROS  The following are not active complaints unless bolded sore throat, dysphagia, dental problems, itching, sneezing,  nasal congestion or excess/ purulent secretions, ear ache,   fever, chills, sweats, unintended wt loss, classically pleuritic or exertional cp, hemoptysis,  orthopnea pnd or leg swelling, presyncope, palpitations, abdominal pain, anorexia, nausea, vomiting, diarrhea  or change in bowel or bladder habits, change in stools or urine, dysuria,hematuria,  rash, arthralgias, visual complaints, headache, numbness, weakness or ataxia or problems with walking or coordination,  change in mood/affect or memory.  HOT FLASHES s sweats sev time per week x sev min            Objective:   Physical Exam  amb wf nad    Wt Readings from Last 3 Encounters:  01/26/16 180 lb (81.647 kg)  12/11/15 179 lb (81.194 kg)  09/25/15 178 lb 6.4 oz (80.922 kg)    Vital signs reviewed    11/05/2013  184 > 11/25/2013   190 > 12/23/2013  190 > 07/16/2014 174  >172 08/01/2014 > 08/18/2014  171 >  09/29/2014 174 >  03/30/2015 178 > 06/14/2015  178 >  179 06/26/2015 > 09/25/2015 178 > 179 12/11/2015 > 01/26/2016  187   HEENT: nl dentition, turbinates, and orophanx. Nl external ear canals without cough reflex   NECK :  without JVD/Nodes/TM/ nl carotid upstrokes bilaterally   LUNGS:     Minimal insp crackles bilaterally on insp  R >  L base   CV:  RRR  no s3 or murmur or increase in P2, tr edema   ABD:  soft and nontender with nl  excursion in the supine position. No bruits or organomegaly, bowel sounds nl  MS:  warm without deformities, calf tenderness, cyanosis or clubbing  SKIN: warm and dry without lesions         I personally reviewed images and agree with radiology impression as follows:  CXR:  12/11/15 Stable interstitial densities throughout both lungs most consistent with scarring, but superimposed edema or inflammation cannot be excluded.              Assessment & Plan:

## 2016-01-26 NOTE — Patient Instructions (Signed)
No change in medications  See calendar for specific medication instructions and bring it back for each and every office visit for every healthcare provider you see.  Without it,  you may not receive the best quality medical care that we feel you deserve.  You will note that the calendar groups together  your maintenance  medications that are timed at particular times of the day.  Think of this as your checklist for what your doctor has instructed you to do until your next evaluation to see what benefit  there is  to staying on a consistent group of medications intended to keep you well.  The other group at the bottom is entirely up to you to use as you see fit  for specific symptoms that may arise between visits that require you to treat them on an as needed basis.  Think of this as your action plan or "what if" list.   Separating the top medications from the bottom group is fundamental to providing you adequate care going forward.     Please schedule a follow up visit in 6  months but call sooner if needed  

## 2016-01-26 NOTE — Progress Notes (Signed)
PFT done today. 01/26/2016  

## 2016-01-27 ENCOUNTER — Encounter: Payer: Self-pay | Admitting: Internal Medicine

## 2016-01-27 NOTE — Assessment & Plan Note (Addendum)
See cxr 09/18/13 - rec avoid all macrodantin exp 12/23/2013  - recurrent macrodantin exposure last filled rx  06/2014 [facility] >cxr much worse 07/16/14 with ESR 32 > started short course prednisone  - med calendar 08/01/2014  -CT chest 08/06/14 >Pulmonary parenchymal pattern of fibrosis, as described above, appears progressive when compared with chest radiographs dating back to 07/27/2009. 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 Findings may be post inflammatory in etiology. - 08/29/2014  Walked RA x 3 laps @ 185 ft each stopped due to  End of study mod pace, sats 91% - 09/29/2014  Walked RA x 3 laps @ 185 ft each stopped due to end of study sat 93% and fast pace   - 09/29/2014 PFTs  VC 1.70 (64%) s obst and dlco 57 corrects to 98% - 03/30/2015   PFTs  VC 1.98 (75%) s obst and dlco 66 corrects to 107  - PFT's  01/26/2016   VC 1.71 (65%) s obst   with DLCO  65/63 % corrects to 104  % for alv volume     No evidence by H and P nor pfts of any dz progression c/w remote lung injury from macrodantin so has good prognosis with main emphasis on managing symptoms and avoiding additional lung injury here..  I had an extended discussion with the patient reviewing all relevant studies completed to date and  lasting 15 to 20 minutes of a 25 minute visit    Each maintenance medication was reviewed in detail including most importantly the difference between maintenance and prns and under what circumstances the prns are to be triggered using an action plan format that is not reflected in the computer generated alphabetically organized AVS but trather by a customized med calendar that reflects the AVS meds with confirmed 100% correlation.   Please see instructions for details which were reviewed in writing and the patient given a copy highlighting the part that I personally wrote and discussed at today's ov.

## 2016-01-27 NOTE — Assessment & Plan Note (Signed)
-  hfa 75% 11/05/13 > rx dulera 100 2bid > changed to symbicort 160 2bid 11/25/13 > improved 09/29/2014 > ok to taper off as of 03/30/15 > flared off - Allergy profile 11/05/2013 >  Eos 6.1%  IgE 363 but RAST only pos to dust - NO 09/25/2015  = 31  - 09/25/2015    try off pm symbicort   - 01/26/2016  After extensive coaching HFA effectiveness =   90%   All goals of chronic asthma control met including optimal function and elimination of symptoms with minimal need for rescue therapy.  Contingencies discussed in full including contacting this office immediately if not controlling the symptoms using the rule of two's.

## 2016-03-01 ENCOUNTER — Ambulatory Visit (INDEPENDENT_AMBULATORY_CARE_PROVIDER_SITE_OTHER): Payer: Medicare Other | Admitting: Emergency Medicine

## 2016-03-01 ENCOUNTER — Encounter: Payer: Self-pay | Admitting: Emergency Medicine

## 2016-03-01 VITALS — BP 120/84 | HR 85 | Wt 177.0 lb

## 2016-03-01 DIAGNOSIS — J704 Drug-induced interstitial lung disorders, unspecified: Secondary | ICD-10-CM | POA: Diagnosis not present

## 2016-03-01 DIAGNOSIS — J45991 Cough variant asthma: Secondary | ICD-10-CM | POA: Diagnosis not present

## 2016-03-01 MED ORDER — DOXYCYCLINE HYCLATE 100 MG PO TABS: 100.0000 mg | ORAL_TABLET | Freq: Two times a day (BID) | ORAL | Status: DC

## 2016-03-01 MED ORDER — PREDNISONE 10 MG PO TABS: ORAL_TABLET | ORAL | Status: DC

## 2016-03-01 NOTE — Assessment & Plan Note (Signed)
I suspect that this is an acute exacerbation of her cough variant asthma although note that she also has a history of interstitial lung disease. Her last pulmonary function testing (when she was similarly exacerbated) was stable. We will treat with prednisone taper and antibiotics given her purulent sputum. She will continue Symbicort twice a day which she has restarted after temporarily cut to every morning. I explained to her that depending on the pattern of her improvement, the pattern further exacerbations, she may need a repeat CT scan of her chest to determine whether the relationship between her flares and her interstitial lung disease. I asked her to discuss this with Dr. Sherene SiresWert

## 2016-03-01 NOTE — Patient Instructions (Signed)
Please take prednisone taper as directed Please take doxycycline 100mg  twice a day for 7 days Continue your existing medications We will need to decide whether there is any role to repeat your CT scan of your chest to compare with 2015. Please discuss this further with Dr Sherene SiresWert Follow with Dr Sherene SiresWert in 2 weeks.

## 2016-03-01 NOTE — Assessment & Plan Note (Signed)
Unclear whether her current complaints relate in any way to her ILD, but must consider. Depending on the frequency and pattern of these exacerbations she will likely need a repeat Ct chest. Asked her to revisit with Dr Sherene SiresWert as above.

## 2016-03-01 NOTE — Progress Notes (Signed)
Subjective:    Patient ID: Casey Freeman, female    DOB: 05/01/1936, 80 y.o.   MRN: [id number]  HPI 80 year old woman, never smoker with a history of possible mild cough variant asthma with chronic cough followed by Dr. Sherene Sires. Also with a hx interstitial disease that has been thought to relate to macrodantin exposure, last CT 08/2014. Her last pulmonary function testing was on 01/30/16 that I have reviewed. This showed principally evidence of restriction on spirometry with some possible evidence of mixed disease, FEV1 stable from 1 year ago.  She reports more cough since last week. She has chronic nasal congestion and drainage that is treated w chlorpheniramine. She decreased symbicort to qam for a couple weeks, then noted more sx, more dyspnea, increased SABA use beginning last week. She went back to BID dosing when these sx started. Now with productive cough, yellow phlegm. More dyspnea.    Past Medical History  Diagnosis Date  . PONV (postoperative nausea and vomiting)     >20 YRS AGO - NO PROBLEM SINCE  . Hypertension   . Arthritis   . Hx: UTI (urinary tract infection)      No family history on file.   Social History   Social History  . Marital Status: Married    Spouse Name: N/A  . Number of Children: N/A  . Years of Education: N/A   Occupational History  . Not on file.   Social History Main Topics  . Smoking status: Never Smoker   . Smokeless tobacco: Never Used  . Alcohol Use: No  . Drug Use: No  . Sexual Activity: Not on file   Other Topics Concern  . Not on file   Social History Narrative     Allergies  Allergen Reactions  . Hydrocodone     Makes her dizzy  . Macrodantin [Nitrofurantoin Macrocrystal]     Lung Dz  . Simvastatin Rash     Outpatient Prescriptions Prior to Visit  Medication Sig Dispense Refill  . acetaminophen (TYLENOL) 500 MG tablet Reported on 12/11/2015    . albuterol (PROAIR HFA) 108 (90 Base) MCG/ACT inhaler Inhale 2 puffs into the  lungs every 4 (four) hours as needed for wheezing or shortness of breath. 1 Inhaler 5  . Ascorbic Acid (VITAMIN C Freeman) Take 1 tablet by mouth daily.    Marland Kitchen aspirin 81 MG tablet Take 81 mg by mouth 2 (two) times a week. On Tuesdays and Thursdays    . budesonide-formoterol (SYMBICORT) 80-4.5 MCG/ACT inhaler Take 2 puffs first thing in am and then another 2 puffs about 12 hours later. 1 Inhaler 5  . chlorpheniramine (CHLOR-TRIMETON) 4 MG tablet 2 at bedtime as needed    . Cholecalciferol (VITAMIN D) 2000 UNITS CAPS Take 1 capsule by mouth daily.    Marland Kitchen dextromethorphan (DELSYM) 30 MG/5ML liquid 2 tsp every 12 hours as needed    . famotidine (PEPCID) 20 MG tablet Take 20 mg by mouth 2 (two) times daily.     . hydrochlorothiazide (HYDRODIURIL) 25 MG tablet Take 25 mg by mouth daily as needed.     Marland Kitchen LORazepam (ATIVAN) 0.5 MG tablet Take 0.5 mg by mouth at bedtime.     . Magnesium 250 MG TABS Take 1 tablet by mouth daily. Reported on 12/11/2015    . Meth-Hyo-M Bl-Na Phos-Ph Sal (URIBEL) 118 MG CAPS Take 1 capsule by mouth every 8 (eight) hours as needed. Reported on 12/11/2015    . telmisartan (MICARDIS) 40 MG  tablet Take 40 mg by mouth daily.    . traMADol (ULTRAM) 50 MG tablet Take 50-100 mg by mouth every 4 (four) hours as needed.     . TRAVATAN Z 0.004 % SOLN ophthalmic solution Apply 1 drop to eye at bedtime.  4  . trimethoprim (TRIMPEX) 100 MG tablet Take 1 tablet by mouth every other day.     Marland Kitchen. amoxicillin (AMOXIL) 500 MG tablet Reported on 01/26/2016     No facility-administered medications prior to visit.      Review of Systems  HENT: Positive for postnasal drip, rhinorrhea, sneezing and sore throat.   Respiratory: Positive for cough and shortness of breath.        Objective:   Physical Exam Filed Vitals:   03/01/16 1208  BP: 120/84  Pulse: 85  Weight: 177 lb (80.287 kg)  SpO2: 94%   Gen: Pleasant, well-nourished, in no distress,  normal affect  ENT: No lesions,  mouth clear,   oropharynx clear, no postnasal drip, hoarse voice, occasional barking cough  Neck: No JVD, no stridor  Lungs: No use of accessory muscles, bilateral inspiratory squeaks and rhonchi most prominent at the bases. No wheeze  Cardiovascular: RRR, heart sounds normal, no murmur or gallops, no peripheral edema  Musculoskeletal: No deformities, no cyanosis or clubbing  Neuro: alert, non focal  Skin: Warm, no lesions or rashes     Assessment & Plan:  Cough variant asthma I suspect that this is an acute exacerbation of her cough variant asthma although note that she also has a history of interstitial lung disease. Her last pulmonary function testing (when she was similarly exacerbated) was stable. We will treat with prednisone taper and antibiotics given her purulent sputum. She will continue Symbicort twice a day which she has restarted after temporarily cut to every morning. I explained to her that depending on the pattern of her improvement, the pattern further exacerbations, she may need a repeat CT scan of her chest to determine whether the relationship between her flares and her interstitial lung disease. I asked her to discuss this with Dr. Sherene SiresWert  Drug-induced diffuse interstitial pulmonary fibrosis (HCC) Unclear whether her current complaints relate in any way to her ILD, but must consider. Depending on the frequency and pattern of these exacerbations she will likely need a repeat Ct chest. Asked her to revisit with Dr Sherene SiresWert as above.    Levy Pupaobert Byrum, MD, PhD 03/01/2016, 1:04 PM [facility] [phone] or if no answer [phone]

## 2016-03-24 ENCOUNTER — Encounter: Payer: Self-pay | Admitting: Internal Medicine

## 2016-03-24 ENCOUNTER — Ambulatory Visit (INDEPENDENT_AMBULATORY_CARE_PROVIDER_SITE_OTHER): Payer: Medicare Other | Admitting: Internal Medicine

## 2016-03-24 VITALS — BP 152/90 | HR 71 | Ht 62.0 in | Wt 182.0 lb

## 2016-03-24 DIAGNOSIS — J45991 Cough variant asthma: Secondary | ICD-10-CM

## 2016-03-24 DIAGNOSIS — J704 Drug-induced interstitial lung disorders, unspecified: Secondary | ICD-10-CM | POA: Diagnosis not present

## 2016-03-24 NOTE — Patient Instructions (Signed)
Whenever resp symptoms flare > change Pantoprazole (protonix) 40 mg   Take  30-60 min before first meal of the day and Pepcid (famotidine)  20 mg one @  bedtime until 100% better then ok to change back to pepcid 20 mg twice daily   Reduce the symbicort to 2 in am and 2 in pm optional   GERD (REFLUX)  is an extremely common cause of respiratory symptoms just like yours , many times with no obvious heartburn at all.    It can be treated with medication, but also with lifestyle changes including elevation of the head of your bed (ideally with 6 inch  bed blocks),  Smoking cessation, avoidance of late meals, excessive alcohol, and avoid fatty foods, chocolate, peppermint, colas, red wine, and acidic juices such as orange juice.  NO MINT OR MENTHOL PRODUCTS SO NO COUGH DROPS  USE SUGARLESS CANDY INSTEAD (Jolley ranchers or Stover's or Life Savers) or even ice chips will also do - the key is to swallow to prevent all throat clearing. NO OIL BASED VITAMINS - use powdered substitutes.   See Tammy NP 6  weeks with all your medications, even over the counter meds, separated in two separate bags, the ones you take no matter what vs the ones you stop once you feel better and take only as needed when you feel you need them.   Tammy  will generate for you a new user friendly medication calendar that will put us all on the same page re: your medication use.     Without this process, it simply isn't possible to assure that we are providing  your outpatient care  with  the attention to detail we feel you deserve.   If we cannot assure that you're getting that kind of care,  then we cannot manage your problem effectively from this clinic.  Once you have seen Tammy and we are sure that we're all on the same page with your medication use she will arrange follow up with me.

## 2016-03-24 NOTE — Assessment & Plan Note (Signed)
-   hfa 75% 11/05/13 > rx dulera 100 2bid > changed to symbicort 160 2bid 11/25/13 > improved 09/29/2014 > ok to taper off as of 03/30/15 > flared off - Allergy profile 11/05/2013 >  Eos 6.1%  IgE 363 but RAST only pos to dust - NO 09/25/2015  = 31  - 09/25/2015    try off pm symbicort  - 03/24/2016  After extensive coaching HFA effectiveness =  75 %  She predominantly has pseudoasthma on exam today with symptoms that remain difficult to control. DDX of  difficult airways management almost all start with A and  include Adherence, Ace Inhibitors, Acid Reflux, Active Sinus Disease, Alpha 1 Antitripsin deficiency, Anxiety masquerading as Airways dz,  ABPA,  Allergy(esp in young), Aspiration (esp in elderly), Adverse effects of meds,  Active smokers, A bunch of PE's (a small clot burden can't cause this syndrome unless there is already severe underlying pulm or vascular dz with poor reserve) plus two Bs  = Bronchiectasis and Beta blocker use..and one C= CHF   Adherence is always the initial "prime suspect" and is a multilayered concern that requires a "trust but verify" approach in every patient - starting with knowing how to use medications, especially inhalers, correctly, keeping up with refills and understanding the fundamental difference between maintenance and prns vs those medications only taken for a very short course and then stopped and not refilled.  - has med calendar but not using correctly - - The proper method of use, as well as anticipated side effects, of a metered-dose inhaler are discussed and demonstrated to the patient. Improved effectiveness after extensive coaching during this visit to a level of approximately 75 % from a baseline of 50 %    ? Acid (or non-acid) GERD > always difficult to exclude as up to 75% of pts in some series report no assoc GI/ Heartburn symptoms> rec max (24h)  acid suppression and diet restrictions/ reviewed and instructions given in writing.   ? Adverse effect of med  > doubt / likely due to symb and abx, not doxy per se  ? Anxiety > dx of exclusion   I had an extended discussion with the patient reviewing all relevant studies completed to date and  lasting 15 to 20 minutes of a 25 minute visit    Each maintenance medication was reviewed in detail including most importantly the difference between maintenance and prns and under what circumstances the prns are to be triggered using an action plan format that is not reflected in the computer generated alphabetically organized AVS but trather by a customized med calendar that reflects the AVS meds with confirmed 100% correlation.   Please see instructions for details which were reviewed in writing and the patient given a copy highlighting the part that I personally wrote and discussed at today's ov.

## 2016-03-24 NOTE — Assessment & Plan Note (Signed)
See cxr 09/18/13 - rec avoid all macrodantin exp 12/23/2013  - recurrent macrodantin exposure last filled rx  06/2014 [facility] >cxr much worse 07/16/14 with ESR 32 > started short course prednisone  - med calendar 08/01/2014  -CT chest 08/06/14 >Pulmonary parenchymal pattern of fibrosis, as described above, appears progressive when compared with chest radiographs dating back to 07/27/2009. 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 Findings may be post inflammatory in etiology. - 08/29/2014  Walked RA x 3 laps @ 185 ft each stopped due to  End of study mod pace, sats 91% - 09/29/2014  Walked RA x 3 laps @ 185 ft each stopped due to end of study sat 93% and fast pace   - 09/29/2014 PFTs  VC 1.70 (64%) s obst and dlco 57 corrects to 98% - 03/30/2015   PFTs  VC 1.98 (75%) s obst and dlco 66 corrects to 107  - PFT's  01/26/2016   VC 1.71 (65%) s obst   with DLCO  65/63 % corrects to 104  % for alv volume    No evidence of flare (which would support IPF > macrodantin) > f/u serially clinically for now/ repeat hrct if doing worse

## 2016-03-24 NOTE — Progress Notes (Signed)
Subjective:    Patient ID: Casey Freeman, female    DOB: Jun 03, 1936    MRN: [id number]   Brief patient profile:  7  yowf never smoker with h/o watery rhinitis/ cough x sev months typically in fall since around 2009 then new onset sob and cough intially dx as bronchitis late 2013  rx zpak and cough syrup and better but  Then newly  started on prn albuterol for wheezing rarely needed until Aug 2014  referred 10/04/2013 by Dr Tiburcio Pea for progressively worse  Wheeze/ cough  despite adding symbicort.  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    History of Present Illness  10/04/2013 1st [facility] office visit/ Casey Freeman cc daily sob/ cough x 4-5  m assoc with variable sob to point where has trouble sometimes with adls and even sometimes sob at rest if coughing assoc with hoarseness, dry cough and subwheeze day > night. rec Stop linsinopril  benicar 20/12.5 one daily  Try prilosec   Take 30-60 min before first meal of the day and Pepcid 20 mg one bedtime until cough is completely gone for at least a week without the need for cough suppression Best cough medication is delsym GERD diet  Prednisone 10 mg take  4 each am x 2 days,   2 each am x 2 days,  1 each am x 2 days and stop  Only use your albuterol as needed    11/05/2013 f/u ov/Casey Freeman re: cough since Fall of 2014  Chief Complaint  Patient presents with  . Follow-up    Pt states that her cough, SOB and wheezing gradually worse since her last visit. Cough is prod with minimal yellow to clear sputum. She is using proair approx 3 x per wk.   if not coughing Sob only with exertion like getting in a real hurry or a flight of steps, much better p last rx with prednisone - cough worse day than night rec Prednisone 10 mg take  4 each am x 2 days,   2 each am x 2 days,  1 each am x 2 days and stop  Dulera 100  Take 2 puffs first thing in am and then another 2 puffs about 12 hours later until you  return Work on inhaler technique:   Continue Prilosec   Take 30-60 min before first meal of the day and Pepcid 20 mg one bedtime and chlortrimeton  one at bedtime  Take delsym two tsp every 12 hours and supplement if needed with  tramadol 50 mg up to 2 every 4 hours   11/25/2013 f/u ov/Casey Freeman re:  Cough since Aug 2014 resolved on dulera 100 2bid p rx with pred Chief Complaint  Patient presents with  . Follow-up    Cough has resolved as of a wk ago. She did notice minimal wheeze this am.  Her breathing has improved and back to her normal baseline.   no more tramadol,  Rare proair need.Not limited by breathing from desired activities   rec Finish up your benicar and then rx micardis 80/12.5 one half daily Stop dulera and start symbicort 160 2 pffs first thing am x one month Ok to wean off the acid suppression and only if needed for acid or coughing.    12/23/2013 f/u ov/Casey Freeman re: chronic cough/ ok off symbiocort / off acid /on  micardis Chief Complaint  Patient presents with  . Follow-up    Pt reports her cough  is much improved and her breathing is doing well. Has not used any inhalers in the past wk.   Not limited by breathing from desired activities  - no need for saba or cough suppression rec Return for any decline in exercise tolerance or a cough for more than 3 weeks   07/16/2014  Acute  ov/Casey Freeman re: recurrent cough on ppi ac one daily/ took another course of macrodantin 06/04/14  Chief Complaint  Patient presents with  . Acute Visit    Pt c/o increased cough with wheezing for the past 10 days.  Cough is prod with minimal to moderate thick, yellow sputum.   She also c/o increased SOB- using proair about 2 x per day.   since previously gradually worse with cough mostly daytime clear to yellow and sp at least  one course of macrodantin x  early Sept and multiple visits for "bronchitis" no better on various abx Cough to point of gagging, sob no better with proair and mostly with  coughing >>pred taper,   delsym and tramadol rx   08/01/2014 Follow and Med review  Patient returns for two-week follow-up for cough Patient was treated with a prednisone taper last visit along with Delsym and tramadol for cough control. Patient does report that she has improved however. Cough is not really gone completely.  Patient does have a history of prolonged Macrodantin use. Her chest x-ray does show bilateral interstitial thickening, most severe at the lung bases, concerning for chronic interstitial lung disease. Patient was instructed not to use Macrodantin any longer. Her sedimentation rate was 32. rec We are setting you up for a CT chest (High Resolution) Do not take Macrodantin -we have placed this on your allergy list.  Follow med calendar closely and bring to each visit.  Take delsym two tsp every 12 hours and supplement if needed with  tramadol 50 mg   09/29/2014 f/u ov/Casey Freeman re: pf ? Macrodantin  Chief Complaint  Patient presents with  . Follow-up    PFT done today. Pt states that her breathing is much improved. She has not had to use rescue inhaler at all since last visit.   no need for saba on symbicort 160 2bid and has not lost ground since last pred  rx Not limited by breathing from desired activities   rec Ok to try wean off symbicort ove next sev week s to see if you notice any worse symptoms and if so restart> worse so gradually worked  Back to 2 bid    06/11/2015  Acute ov/Casey Freeman re:  PF/ ? Cough variant asthma?  Flare  Chief Complaint  Patient presents with  . Acute Visit    Pt c/o increased cough and wheezing for the past 2 wks. Cough is prod with minimal clear sputum.  She is using using rescue inhaler 1-2 x per day.    one week after stopped symb symtoms recurred and one week prior to OV  Started Back on 2 bid and still needing lots of saba Symptoms are day > noct/ cough mostly dry not using tramadol as per med calendar   >>pred taper , symbicort 80    06/26/2015 Follow up : PF/?Cough variant asthma  Pt returns for 2 week follow up .  Pt seen last ov with flare of cough /asthma Tx w/ prednisone taper  and decreased symbicort 80 .  Feels so much better-100% better.  No ProAir use.  We reviewed all her meds and organized them into a  med calendar with pt education  rec Continue on current regimen  Follow med calendar closely and bring to each visit   09/25/2015  f/u ov/Casey Freeman re:  PF with cough variant asthma vs UACS  Chief Complaint  Patient presents with  . Follow-up    Pt states her cough has pretty much resolved. Her breathing is doing well and she rarely uses rescue inhaler.   needed proair with recent uri/ sinus flare but not lately Wants to start cutting down on maint rx as overall feeling much better >>add pepcid as maint and stop ppi   12/11/2015 Acute OV : PF with cough variant asthma  Pt presents for an acute office visit.  Complains of  prod cough with a small amount of yellow mucus, chest tightness/congestion, sinus pressure/drainage, SOB with activity and wheezing x 2 week.  Using delsym and tramadol for cough.  Denies any fever, nausea or vomiting.  Says she has really been doing very well up until 2 weeks ago.  rec Augmentin 875mg  Twice daily  For 7 days -take with food.  Prednisone taper over next week.      01/26/2016  f/u ov/Casey Freeman re: Casey Freeman PF/ ? Cough variant asthma maint rx with symbicort 80 2bid no longer on ppi just pepcid bid  Chief Complaint  Patient presents with  . Follow-up    PFT done today. Pt states her breathing has improved. She has not used albuterol in the past few wks. She c/o some hoarseness today.   doe x vacuuming / sev flights / MMRC1 = can walk nl pace, flat grade, can't hurry or go uphills or steps s sob   rec no change rx   Acute ov / Casey Freeman / cough  03/01/16  Please take prednisone taper as directed Please take doxycycline 100mg  twice a day for 7 days Continue your existing  medications    03/24/2016  f/u ov/Casey Freeman re: PF from macrodantin /uacs vs cough variant asthma  Chief Complaint  Patient presents with  . Follow-up    Cough has improved. She c/o throat irritation and hoarseness since taking doxy prescribed at last visit. Her breathing is some better, but not back to baseline. She is using proair at least once per day on average.     Very confused with contingencies despite med calendar with action plan, using cough drops and lemon for throat.    No obvious day to day or daytime variability or assoc excess/ purulent sputum or mucus plugs  or cp or chest tightness, subjective wheeze or overt sinus or hb symptoms. No unusual exp hx or h/o childhood pna/ asthma or knowledge of premature birth.  Sleeping ok without nocturnal  or early am exacerbation  of respiratory  c/o's or need for noct saba. Also denies any obvious fluctuation of symptoms with weather or environmental changes or other aggravating or alleviating factors except as outlined above   Current Medications, Allergies, Complete Past Medical History, Past Surgical History, Family History, and Social History were reviewed in [facility] electronic medical record.  ROS  The following are not active complaints unless bolded sore throat, dysphagia, dental problems, itching, sneezing,  nasal congestion or excess/ purulent secretions, ear ache,   fever, chills, sweats, unintended wt loss, classically pleuritic or exertional cp, hemoptysis,  orthopnea pnd or leg swelling, presyncope, palpitations, abdominal pain, anorexia, nausea, vomiting, diarrhea  or change in bowel or bladder habits, change in stools or urine, dysuria,hematuria,  rash, arthralgias, visual complaints, headache, numbness, weakness or  ataxia or problems with walking or coordination,  change in mood/affect or memory.            Objective:   Physical Exam  amb wf nad  / extremely hoarse    Vital signs reviewed    11/05/2013  184 >  11/25/2013   190 > 12/23/2013  190 > 07/16/2014 174  >172 08/01/2014 > 08/18/2014  171 >  09/29/2014 174 >  03/30/2015 178 > 06/14/2015  178 >  179 06/26/2015 > 09/25/2015 178 > 179 12/11/2015 > 01/26/2016   187 > 03/24/2016   182  HEENT: nl dentition, turbinates, and orophanx. Nl external ear canals without cough reflex   NECK :  without JVD/Nodes/TM/ nl carotid upstrokes bilaterally   LUNGS:     Minimal insp crackles bilaterally on insp  R >  L base with prominent pseudowheezing  CV:  RRR  no s3 or murmur or increase in P2, tr edema   ABD:  soft and nontender with nl excursion in the supine position. No bruits or organomegaly, bowel sounds nl  MS:  warm without deformities, calf tenderness, cyanosis or clubbing  SKIN: warm and dry without lesions                      Assessment & Plan:

## 2016-05-06 ENCOUNTER — Ambulatory Visit (INDEPENDENT_AMBULATORY_CARE_PROVIDER_SITE_OTHER): Payer: Medicare Other | Admitting: Adult Health

## 2016-05-06 ENCOUNTER — Ambulatory Visit (INDEPENDENT_AMBULATORY_CARE_PROVIDER_SITE_OTHER)
Admission: RE | Admit: 2016-05-06 | Discharge: 2016-05-06 | Disposition: A | Discharge status: Home / Self Care | Payer: Medicare Other | Source: Ambulatory Visit | Attending: Adult Health | Admitting: Adult Health

## 2016-05-06 ENCOUNTER — Encounter: Payer: Self-pay | Admitting: Adult Health

## 2016-05-06 VITALS — BP 116/84 | HR 82 | Temp 98.0°F | Ht 62.0 in | Wt 169.0 lb

## 2016-05-06 DIAGNOSIS — J704 Drug-induced interstitial lung disorders, unspecified: Secondary | ICD-10-CM

## 2016-05-06 DIAGNOSIS — R0602 Shortness of breath: Secondary | ICD-10-CM | POA: Diagnosis not present

## 2016-05-06 DIAGNOSIS — J45991 Cough variant asthma: Secondary | ICD-10-CM | POA: Diagnosis not present

## 2016-05-06 MED ORDER — PREDNISONE 10 MG PO TABS: ORAL_TABLET | ORAL | 0 refills | Status: DC

## 2016-05-06 MED ORDER — BUDESONIDE-FORMOTEROL FUMARATE 80-4.5 MCG/ACT IN AERO: INHALATION_SPRAY | RESPIRATORY_TRACT | 5 refills | Status: DC

## 2016-05-06 NOTE — Assessment & Plan Note (Signed)
Flare   Plan  Prednisone taper over next week.  Chest xray today .  Follow med calendar closely and bring to each visit.  Take Protonix daily in am  Take pepcid At bedtime .  May use chlortrimeton 4mg  every 4hr as needed for drippy nose, throat clearing and drainage.  Follow up with Dr. Sherene Sires  In 6-8 weeks and As needed   Please contact office for sooner follow up if symptoms do not improve or worsen or seek emergency care

## 2016-05-06 NOTE — Addendum Note (Signed)
Addended by: Karalee Height on: 05/06/2016 12:42 PM   Modules accepted: Orders

## 2016-05-06 NOTE — Progress Notes (Signed)
Subjective:    Patient ID: Casey Freeman, female    DOB: 25-May-1936    MRN: [id number]   Brief patient profile:  77  yowf never smoker with h/o watery rhinitis/ cough x sev months typically in fall since around 2009 then new onset sob and cough intially dx as bronchitis late 2013  rx zpak and cough syrup and better but  Then newly  started on prn albuterol for wheezing rarely needed until Aug 2014  referred 10/04/2013 by Dr Tiburcio Pea for progressively worse  Wheeze/ cough  despite adding symbicort.  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    History of Present Illness  10/04/2013 1st [facility] office visit/ Wert cc daily sob/ cough x 4-5  m assoc with variable sob to point where has trouble sometimes with adls and even sometimes sob at rest if coughing assoc with hoarseness, dry cough and subwheeze day > night. rec Stop linsinopril  benicar 20/12.5 one daily  Try prilosec   Take 30-60 min before first meal of the day and Pepcid 20 mg one bedtime until cough is completely gone for at least a week without the need for cough suppression Best cough medication is delsym GERD diet  Prednisone 10 mg take  4 each am x 2 days,   2 each am x 2 days,  1 each am x 2 days and stop  Only use your albuterol as needed    11/05/2013 f/u ov/Wert re: cough since Fall of 2014  Chief Complaint  Patient presents with  . Follow-up    Pt states that her cough, SOB and wheezing gradually worse since her last visit. Cough is prod with minimal yellow to clear sputum. She is using proair approx 3 x per wk.   if not coughing Sob only with exertion like getting in a real hurry or a flight of steps, much better p last rx with prednisone - cough worse day than night rec Prednisone 10 mg take  4 each am x 2 days,   2 each am x 2 days,  1 each am x 2 days and stop  Dulera 100  Take 2 puffs first thing in am and then another 2 puffs about 12 hours later until you  return Work on inhaler technique:   Continue Prilosec   Take 30-60 min before first meal of the day and Pepcid 20 mg one bedtime and chlortrimeton  one at bedtime  Take delsym two tsp every 12 hours and supplement if needed with  tramadol 50 mg up to 2 every 4 hours   11/25/2013 f/u ov/Wert re:  Cough since Aug 2014 resolved on dulera 100 2bid p rx with pred Chief Complaint  Patient presents with  . Follow-up    Cough has resolved as of a wk ago. She did notice minimal wheeze this am.  Her breathing has improved and back to her normal baseline.   no more tramadol,  Rare proair need.Not limited by breathing from desired activities   rec Finish up your benicar and then rx micardis 80/12.5 one half daily Stop dulera and start symbicort 160 2 pffs first thing am x one month Ok to wean off the acid suppression and only if needed for acid or coughing.    12/23/2013 f/u ov/Wert re: chronic cough/ ok off symbiocort / off acid /on  micardis Chief Complaint  Patient presents with  . Follow-up    Pt reports her cough  is much improved and her breathing is doing well. Has not used any inhalers in the past wk.   Not limited by breathing from desired activities  - no need for saba or cough suppression rec Return for any decline in exercise tolerance or a cough for more than 3 weeks   07/16/2014  Acute  ov/Wert re: recurrent cough on ppi ac one daily/ took another course of macrodantin 06/04/14  Chief Complaint  Patient presents with  . Acute Visit    Pt c/o increased cough with wheezing for the past 10 days.  Cough is prod with minimal to moderate thick, yellow sputum.   She also c/o increased SOB- using proair about 2 x per day.   since previously gradually worse with cough mostly daytime clear to yellow and sp at least  one course of macrodantin x  early Sept and multiple visits for "bronchitis" no better on various abx Cough to point of gagging, sob no better with proair and mostly with  coughing >>pred taper,   delsym and tramadol rx   08/01/2014 Follow and Med review  Patient returns for two-week follow-up for cough Patient was treated with a prednisone taper last visit along with Delsym and tramadol for cough control. Patient does report that she has improved however. Cough is not really gone completely.  Patient does have a history of prolonged Macrodantin use. Her chest x-ray does show bilateral interstitial thickening, most severe at the lung bases, concerning for chronic interstitial lung disease. Patient was instructed not to use Macrodantin any longer. Her sedimentation rate was 32. rec We are setting you up for a CT chest (High Resolution) Do not take Macrodantin -we have placed this on your allergy list.  Follow med calendar closely and bring to each visit.  Take delsym two tsp every 12 hours and supplement if needed with  tramadol 50 mg   09/29/2014 f/u ov/Wert re: pf ? Macrodantin  Chief Complaint  Patient presents with  . Follow-up    PFT done today. Pt states that her breathing is much improved. She has not had to use rescue inhaler at all since last visit.   no need for saba on symbicort 160 2bid and has not lost ground since last pred  rx Not limited by breathing from desired activities   rec Ok to try wean off symbicort ove next sev week s to see if you notice any worse symptoms and if so restart> worse so gradually worked  Back to 2 bid    06/11/2015  Acute ov/Wert re:  PF/ ? Cough variant asthma?  Flare  Chief Complaint  Patient presents with  . Acute Visit    Pt c/o increased cough and wheezing for the past 2 wks. Cough is prod with minimal clear sputum.  She is using using rescue inhaler 1-2 x per day.    one week after stopped symb symtoms recurred and one week prior to OV  Started Back on 2 bid and still needing lots of saba Symptoms are day > noct/ cough mostly dry not using tramadol as per med calendar   >>pred taper , symbicort 80    06/26/2015 Follow up : PF/?Cough variant asthma  Pt returns for 2 week follow up .  Pt seen last ov with flare of cough /asthma Tx w/ prednisone taper  and decreased symbicort 80 .  Feels so much better-100% better.  No ProAir use.  We reviewed all her meds and organized them into a  med calendar with pt education  rec Continue on current regimen  Follow med calendar closely and bring to each visit   09/25/2015  f/u ov/Wert re:  PF with cough variant asthma vs UACS  Chief Complaint  Patient presents with  . Follow-up    Pt states her cough has pretty much resolved. Her breathing is doing well and she rarely uses rescue inhaler.   needed proair with recent uri/ sinus flare but not lately Wants to start cutting down on maint rx as overall feeling much better >>add pepcid as maint and stop ppi   12/11/2015 Acute OV : PF with cough variant asthma  Pt presents for an acute office visit.  Complains of  prod cough with a small amount of yellow mucus, chest tightness/congestion, sinus pressure/drainage, SOB with activity and wheezing x 2 week.  Using delsym and tramadol for cough.  Denies any fever, nausea or vomiting.  Says she has really been doing very well up until 2 weeks ago.  rec Augmentin 875mg  Twice daily  For 7 days -take with food.  Prednisone taper over next week.      01/26/2016  f/u ov/Wert re: Vedia Pereyra PF/ ? Cough variant asthma maint rx with symbicort 80 2bid no longer on ppi just pepcid bid  Chief Complaint  Patient presents with  . Follow-up    PFT done today. Pt states her breathing has improved. She has not used albuterol in the past few wks. She c/o some hoarseness today.   doe x vacuuming / sev flights / MMRC1 = can walk nl pace, flat grade, can't hurry or go uphills or steps s sob   rec no change rx   Acute ov / Byrum / cough  03/01/16  Please take prednisone taper as directed Please take doxycycline 100mg  twice a day for 7 days Continue your existing  medications    03/24/2016  f/u ov/Wert re: PF from macrodantin /uacs vs cough variant asthma  Chief Complaint  Patient presents with  . Follow-up    Cough has improved. She c/o throat irritation and hoarseness since taking doxy prescribed at last visit. Her breathing is some better, but not back to baseline. She is using proair at least once per day on average.     Very confused with contingencies despite med calendar with action plan, using cough drops and lemon for throat. >>decrease symbicort 2 puffs daily -  05/06/2016  Follow up : PF from macrodantin vs cough variant asthma  Pt returns for 6 weeks follow up and med review  Seen last with slow to resolve bronchitis /UAC . Was instructed to decrease symbicort. She has some but not everyday b/c it helps her wheezing.  We reviewed all her meds and organized them into a med calendar with pt education  Appears to be taking meds correctly.   Hoarseness and cough persisted . Referred to ENT by PCP .  Seen by Dr. Haroldine Laws, underwent laryngoscopy told she had inflammed vocal cords.  tx with abx , voice rest . Voice is some better but still hoarse. Cough is no better,  Taking deslym , tramadol . We discussed aggressive cough control regimen.  She denies fever, discolored mucus , hemoptysis or orthopnea.  Weight is down ~10lb, says she is not eating as well.    Current Medications, Allergies, Complete Past Medical History, Past Surgical History, Family History, and Social History were reviewed in [employer] record.  ROS  The following are not active  complaints unless bolded sore throat, dysphagia, dental problems, itching, sneezing,  nasal congestion or excess/ purulent secretions, ear ache,   fever, chills, sweats, unintended wt loss, classically pleuritic or exertional cp, hemoptysis,  orthopnea pnd or leg swelling, presyncope, palpitations, abdominal pain, anorexia, nausea, vomiting, diarrhea  or change in bowel or  bladder habits, change in stools or urine, dysuria,hematuria,  rash, arthralgias, visual complaints, headache, numbness, weakness or ataxia or problems with walking or coordination,  change in mood/affect or memory.            Objective:   Physical Exam  amb wf nad  / extremely hoarse    Vital signs reviewed  Vitals:   05/06/16 1030  BP: 116/84  Pulse: 82  Temp: 98 F (36.7 C)  TempSrc: Oral  SpO2: 94%  Weight: 169 lb (76.7 kg)  Height: 5\' 2"  (1.575 m)      11/05/2013  184 > 11/25/2013   190 > 12/23/2013  190 > 07/16/2014 174  >172 08/01/2014 > 08/18/2014  171 >  09/29/2014 174 >  03/30/2015 178 > 06/14/2015  178 >  179 06/26/2015 > 09/25/2015 178 > 179 12/11/2015 > 01/26/2016   187 > 03/24/2016   182  HEENT: nl dentition, turbinates, and orophanx. Nl external ear canals without cough reflex   NECK :  without JVD/Nodes/TM/ nl carotid upstrokes bilaterally   LUNGS:     Minimal insp crackles bilaterally on insp  R >  L base   CV:  RRR  no s3 or murmur or increase in P2, tr edema   ABD:  soft and nontender with nl excursion in the supine position. No bruits or organomegaly, bowel sounds nl  MS:  warm without deformities, calf tenderness, cyanosis or clubbing  SKIN: warm and dry without lesions           Soundra Lampley NP-C  [facility]  05/06/2016        Drayven Marchena NP-C  [facility]  05/06/2016

## 2016-05-06 NOTE — Patient Instructions (Signed)
Prednisone taper over next week.  Chest xray today .  Follow med calendar closely and bring to each visit.  Take Protonix daily in am  Take pepcid At bedtime .  May use chlortrimeton 4mg  every 4hr as needed for drippy nose, throat clearing and drainage.  Follow up with Dr. Sherene Sires  In 6-8 weeks and As needed   Please contact office for sooner follow up if symptoms do not improve or worsen or seek emergency care

## 2016-05-06 NOTE — Assessment & Plan Note (Signed)
Flare with UAC and slow to resolve bronchitis and laryngitis   Plan  Prednisone taper over next week.  Chest xray today .  Follow med calendar closely and bring to each visit.  Take Protonix daily in am  Take pepcid At bedtime .  May use chlortrimeton 4mg  every 4hr as needed for drippy nose, throat clearing and drainage.  Follow up with Dr. Sherene Sires  In 6-8 weeks and As needed   Please contact office for sooner follow up if symptoms do not improve or worsen or seek emergency care

## 2016-05-08 NOTE — Progress Notes (Signed)
Chart and office note reviewed in detail  > agree with a/p as outlined    

## 2016-05-09 NOTE — Progress Notes (Signed)
Called spoke with patient, advised of cxr results / recs as stated by TP.  Pt verbalized her understanding and denied any questions. 

## 2016-06-07 ENCOUNTER — Other Ambulatory Visit: Payer: Self-pay | Admitting: Family Medicine

## 2016-06-07 DIAGNOSIS — Z1231 Encounter for screening mammogram for malignant neoplasm of breast: Secondary | ICD-10-CM

## 2016-06-10 ENCOUNTER — Ambulatory Visit (INDEPENDENT_AMBULATORY_CARE_PROVIDER_SITE_OTHER): Payer: Medicare Other | Admitting: Internal Medicine

## 2016-06-10 ENCOUNTER — Encounter: Payer: Self-pay | Admitting: Internal Medicine

## 2016-06-10 VITALS — BP 112/82 | HR 71 | Ht 62.0 in | Wt 171.4 lb

## 2016-06-10 DIAGNOSIS — J704 Drug-induced interstitial lung disorders, unspecified: Secondary | ICD-10-CM | POA: Diagnosis not present

## 2016-06-10 DIAGNOSIS — J45991 Cough variant asthma: Secondary | ICD-10-CM | POA: Diagnosis not present

## 2016-06-10 LAB — NITRIC OXIDE: Nitric Oxide: 75

## 2016-06-10 MED ORDER — PANTOPRAZOLE SODIUM 40 MG PO TBEC: 40.0000 mg | DELAYED_RELEASE_TABLET | Freq: Every day | ORAL | 11 refills | Status: DC

## 2016-06-10 NOTE — Patient Instructions (Signed)
Whenever resp symptoms flare > change Pantoprazole (protonix) 40 mg   Take  30-60 min before first meal of the day and Pepcid (famotidine)  20 mg one @  bedtime until 100% better then ok to change back to pepcid 20 mg twice daily     See calendar for specific medication instructions and bring it back for each and every office visit for every healthcare provider you see.  Without it,  you may not receive the best quality medical care that we feel you deserve.  You will note that the calendar groups together  your maintenance  medications that are timed at particular times of the day.  Think of this as your checklist for what your doctor has instructed you to do until your next evaluation to see what benefit  there is  to staying on a consistent group of medications intended to keep you well.  The other group at the bottom is entirely up to you to use as you see fit  for specific symptoms that may arise between visits that require you to treat them on an as needed basis.  Think of this as your action plan or "what if" list.   Separating the top medications from the bottom group is fundamental to providing you adequate care going forward.

## 2016-06-10 NOTE — Progress Notes (Signed)
Subjective:    Patient ID: Mauricio PoBronna B Wollard, female    DOB: [birth year]    MRN: [id number]   Brief patient profile:  [id number]  yowf never smoker with h/o watery rhinitis/ cough x sev months typically in fall since around 2009 then new onset sob and cough intially dx as bronchitis late 2013  rx zpak and cough syrup and better but  Then newly  started on prn albuterol for wheezing rarely needed until Aug 2014  referred 10/04/2013 by Dr Tiburcio PeaHarris for progressively worse  Wheeze/ cough  despite adding symbicort.  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    History of Present Illness  10/04/2013 1st [facility] office visit/ Corinthia Helmers cc daily sob/ cough x 4-5  m assoc with variable sob to point where has trouble sometimes with adls and even sometimes sob at rest if coughing assoc with hoarseness, dry cough and subwheeze day > night. rec Stop linsinopril  benicar 20/12.5 one daily  Try prilosec 20mg   Take 30-60 min before first meal of the day and Pepcid 20 mg one bedtime until cough is completely gone for at least a week without the need for cough suppression Best cough medication is delsym GERD diet  Prednisone 10 mg take  4 each am x 2 days,   2 each am x 2 days,  1 each am x 2 days and stop  Only use your albuterol as needed    11/05/2013 f/u ov/Audley Hinojos re: cough since Fall of 2014  Chief Complaint  Patient presents with  . Follow-up    Pt states that her cough, SOB and wheezing gradually worse since her last visit. Cough is prod with minimal yellow to clear sputum. She is using proair approx 3 x per wk.   if not coughing Sob only with exertion like getting in a real hurry or a flight of steps, much better p last rx with prednisone - cough worse day than night rec Prednisone 10 mg take  4 each am x 2 days,   2 each am x 2 days,  1 each am x 2 days and stop  Dulera 100  Take 2 puffs first thing in am and then another 2 puffs about 12 hours later until you  return Work on inhaler technique:   Continue Prilosec 20mg   Take 30-60 min before first meal of the day and Pepcid 20 mg one bedtime and chlortrimeton 4mg  one at bedtime  Take delsym two tsp every 12 hours and supplement if needed with  tramadol 50 mg up to 2 every 4 hours   11/25/2013 f/u ov/Annaleah Arata re:  Cough since Aug 2014 resolved on dulera 100 2bid p rx with pred Chief Complaint  Patient presents with  . Follow-up    Cough has resolved as of a wk ago. She did notice minimal wheeze this am.  Her breathing has improved and back to her normal baseline.   no more tramadol,  Rare proair need.Not limited by breathing from desired activities   rec Finish up your benicar and then rx micardis 80/12.5 one half daily Stop dulera and start symbicort 160 2 pffs first thing am x one month Ok to wean off the acid suppression and only if needed for acid or coughing.    12/23/2013 f/u ov/Winry Egnew re: chronic cough/ ok off symbiocort / off acid /on  micardis Chief Complaint  Patient presents with  . Follow-up    Pt reports her cough  is much improved and her breathing is doing well. Has not used any inhalers in the past wk.   Not limited by breathing from desired activities  - no need for saba or cough suppression rec Return for any decline in exercise tolerance or a cough for more than 3 weeks   07/16/2014  Acute  ov/Jams Trickett re: recurrent cough on ppi ac one daily/ took another course of macrodantin 06/04/14  Chief Complaint  Patient presents with  . Acute Visit    Pt c/o increased cough with wheezing for the past 10 days.  Cough is prod with minimal to moderate thick, yellow sputum.   She also c/o increased SOB- using proair about 2 x per day.   since previously gradually worse with cough mostly daytime clear to yellow and sp at least  one course of macrodantin x  early Sept and multiple visits for "bronchitis" no better on various abx Cough to point of gagging, sob no better with proair and mostly with  coughing >>pred taper,   delsym and tramadol rx   08/01/2014 Follow and Med review  Patient returns for two-week follow-up for cough Patient was treated with a prednisone taper last visit along with Delsym and tramadol for cough control. Patient does report that she has improved however. Cough is not really gone completely.  Patient does have a history of prolonged Macrodantin use. Her chest x-ray does show bilateral interstitial thickening, most severe at the lung bases, concerning for chronic interstitial lung disease. Patient was instructed not to use Macrodantin any longer. Her sedimentation rate was 32. rec We are setting you up for a CT chest (High Resolution) Do not take Macrodantin -we have placed this on your allergy list.  Follow med calendar closely and bring to each visit.  Take delsym two tsp every 12 hours and supplement if needed with  tramadol 50 mg   09/29/2014 f/u ov/Qiara Minetti re: pf ? Macrodantin  Chief Complaint  Patient presents with  . Follow-up    PFT done today. Pt states that her breathing is much improved. She has not had to use rescue inhaler at all since last visit.   no need for saba on symbicort 160 2bid and has not lost ground since last pred  rx Not limited by breathing from desired activities   rec Ok to try wean off symbicort ove next sev week s to see if you notice any worse symptoms and if so restart> worse so gradually worked  Back to 2 bid    06/11/2015  Acute ov/Mister Krahenbuhl re:  PF/ ? Cough variant asthma?  Flare  Chief Complaint  Patient presents with  . Acute Visit    Pt c/o increased cough and wheezing for the past 2 wks. Cough is prod with minimal clear sputum.  She is using using rescue inhaler 1-2 x per day.    one week after stopped symb symtoms recurred and one week prior to OV  Started Back on 2 bid and still needing lots of saba Symptoms are day > noct/ cough mostly dry not using tramadol as per med calendar   >>pred taper , symbicort 80    06/26/2015 Follow up : PF/?Cough variant asthma  Pt returns for 2 week follow up .  Pt seen last ov with flare of cough /asthma Tx w/ prednisone taper  and decreased symbicort 80 .  Feels so much better-100% better.  No ProAir use.  We reviewed all her meds and organized them into a  med calendar with pt education  rec Continue on current regimen  Follow med calendar closely and bring to each visit   09/25/2015  f/u ov/Davionne Dowty re:  PF with cough variant asthma vs UACS  Chief Complaint  Patient presents with  . Follow-up    Pt states her cough has pretty much resolved. Her breathing is doing well and she rarely uses rescue inhaler.   needed proair with recent uri/ sinus flare but not lately Wants to start cutting down on maint rx as overall feeling much better >>add pepcid as maint and stop ppi   12/11/2015 Acute OV : PF with cough variant asthma  Pt presents for an acute office visit.  Complains of  prod cough with a small amount of yellow mucus, chest tightness/congestion, sinus pressure/drainage, SOB with activity and wheezing x 2 week.  Using delsym and tramadol for cough.  Denies any fever, nausea or vomiting.  Says she has really been doing very well up until 2 weeks ago.  rec Augmentin 84m Twice daily  For 7 days -take with food.  Prednisone taper over next week.      01/26/2016  f/u ov/Ladaja Yusupov re: mBenay SpicePF/ ? Cough variant asthma maint rx with symbicort 80 2bid no longer on ppi just pepcid bid  Chief Complaint  Patient presents with  . Follow-up    PFT done today. Pt states her breathing has improved. She has not used albuterol in the past few wks. She c/o some hoarseness today.   doe x vacuuming / sev flights / MMRC1 = can walk nl pace, flat grade, can't hurry or go uphills or steps s sob   rec no change rx   Acute ov / Byrum / cough  03/01/16  Please take prednisone taper as directed Please take doxycycline 1032mtwice a day for 7 days Continue your existing  medications    03/24/2016  f/u ov/Amarylis Rovito re: PF from macrodantin /uacs vs cough variant asthma  Chief Complaint  Patient presents with  . Follow-up    Cough has improved. She c/o throat irritation and hoarseness since taking doxy prescribed at last visit. Her breathing is some better, but not back to baseline. She is using proair at least once per day on average.   Very confused with contingencies despite med calendar with action plan, using cough drops and lemon for throat. rec Whenever resp symptoms flare > change Pantoprazole (protonix) 40 mg   Take  30-60 min before first meal of the day and Pepcid (famotidine)  20 mg one @  bedtime until 100% better then ok to change back to pepcid 20 mg twice daily  Reduce the symbicort to 2 in am and 2 in pm optional  GERD diet   05/06/2016  NP Follow up : PF from macrodantin vs cough variant asthma  Pt returns for 6 weeks follow up and med review  Seen last with slow to resolve bronchitis /UAC . Was instructed to decrease symbicort. She has some but not everyday b/c it helps her wheezing.  We reviewed all her meds and organized them into a med calendar with pt education  Appears to be taking meds correctly.  Hoarseness and cough persisted . Referred to ENT by PCP .  Seen by Dr. CrErnesto Rutherfordunderwent laryngoscopy told she had inflammed vocal cords.  tx with abx , voice rest . Voice is some better but still hoarse. Cough is no better,  Taking deslym , tramadol . We discussed aggressive cough control regimen.  She denies fever, discolored mucus , hemoptysis or orthopnea.  Weight is down ~10lb, says she is not eating as well. rec Prednisone taper over next week.  Chest xray today .  Follow med calendar closely and bring to each visit.  Take Protonix daily in am  Take pepcid At bedtime .  May use chlortrimeton 4mg  every 4hr as needed for drippy nose, throat clearing and drainage.     06/10/2016  f/u ov/Jerae Izard re: PF/ cough variant asthma vs uacs rx symb  80 2bid/ no longer on max gerd rx / using med calendar but ad libbing a bit on maint vs prns Doe = MMRC2 = can't walk a nl pace on a flat grade s sob but does fine slow and flat eg shopping at [employer]    No obvious day to day or daytime variability or assoc excess/ purulent sputum or mucus plugs or hemoptysis or cp or chest tightness, subjective wheeze or overt sinus or hb symptoms. No unusual exp hx or h/o childhood pna/ asthma or knowledge of premature birth.  Sleeping ok without nocturnal  or early am exacerbation  of respiratory  c/o's or need for noct saba. Also denies any obvious fluctuation of symptoms with weather or environmental changes or other aggravating or alleviating factors except as outlined above   Current Medications, Allergies, Complete Past Medical History, Past Surgical History, Family History, and Social History were reviewed in [facility] electronic medical record.  ROS  The following are not active complaints unless bolded sore throat, dysphagia, dental problems, itching, sneezing,  nasal congestion or excess/ purulent secretions, ear ache,   fever, chills, sweats, unintended wt loss, classically pleuritic or exertional cp,  orthopnea pnd or leg swelling, presyncope, palpitations, abdominal pain, anorexia, nausea, vomiting, diarrhea  or change in bowel or bladder habits, change in stools or urine, dysuria,hematuria,  rash, arthralgias, visual complaints, headache, numbness, weakness or ataxia or problems with walking or coordination,  change in mood/affect or memory.                 Objective:   Physical Exam  amb wf nad  / mildly hoarse   Vital signs reviewed :  Note sats 95% RA on arrival    11/05/2013  184 > 11/25/2013   190 > 12/23/2013  190 > 07/16/2014 174  >172 08/01/2014 > 08/18/2014  171 >  09/29/2014 174 >  03/30/2015 178 > 06/14/2015  178 >  179 06/26/2015 > 09/25/2015 178 > 179 12/11/2015 > 01/26/2016   187 > 03/24/2016   182 >  06/10/2016   171   HEENT: nl  dentition, turbinates, and orophanx. Nl external ear canals without cough reflex   NECK :  without JVD/Nodes/TM/ nl carotid upstrokes bilaterally   LUNGS:     Minimal insp crackles bilaterally on insp  R >  L base s cough on stp   CV:  RRR  no s3 or murmur or increase in P2, tr edema   ABD:  soft and nontender with nl excursion in the supine position. No bruits or organomegaly, bowel sounds nl  MS:  warm without deformities, calf tenderness, cyanosis or clubbing  SKIN: warm and dry without lesions

## 2016-06-11 NOTE — Assessment & Plan Note (Addendum)
-   hfa 75% 11/05/13 > rx dulera 100 2bid > changed to symbicort 160 2bid 11/25/13 > improved 09/29/2014 > ok to taper off as of 03/30/15 > flared off - Allergy profile 11/05/2013 >  Eos 6.1%  IgE 363 but RAST only pos to dust - NO 09/25/2015  = 31 on symbicort 80 2bid - 09/25/2015    try off pm symbicort  - FENO 06/10/2016  =   75 on symb 80 2bid though hfa not optimal    - 06/10/2016  After extensive coaching HFA effectiveness =  75 % (Ti too short)    FENO suggests the ICS dose is not adequate but she has a lot of upper airway symptoms that are likely to flare on higher doses so will first try to optimize the hfa and if flares on 80 2bid then increase to 160 2bid via spacer with the alternative being adding low dose qvar.  I had an extended discussion with the patient reviewing all relevant studies completed to date and  lasting 15 to 20 minutes of a 25 minute visit    Each maintenance medication was reviewed in detail including most importantly the difference between maintenance and prns and under what circumstances the prns are to be triggered using an action plan format that is not reflected in the computer generated alphabetically organized AVS but trather by a customized med calendar that reflects the AVS meds with confirmed 100% correlation.   Please see instructions for details which were reviewed in writing and the patient given a copy highlighting the part that I personally wrote and discussed at today's ov.

## 2016-07-01 ENCOUNTER — Ambulatory Visit
Admission: RE | Admit: 2016-07-01 | Discharge: 2016-07-01 | Disposition: A | Discharge status: Home / Self Care | Payer: Medicare Other | Source: Ambulatory Visit | Attending: Family Medicine | Admitting: Family Medicine

## 2016-07-01 DIAGNOSIS — Z1231 Encounter for screening mammogram for malignant neoplasm of breast: Secondary | ICD-10-CM

## 2016-07-05 ENCOUNTER — Ambulatory Visit (INDEPENDENT_AMBULATORY_CARE_PROVIDER_SITE_OTHER): Payer: Medicare Other | Admitting: Adult Health

## 2016-07-05 ENCOUNTER — Encounter: Payer: Self-pay | Admitting: Adult Health

## 2016-07-05 ENCOUNTER — Other Ambulatory Visit (INDEPENDENT_AMBULATORY_CARE_PROVIDER_SITE_OTHER): Payer: Medicare Other

## 2016-07-05 VITALS — BP 108/68 | HR 80 | Temp 97.6°F | Ht 62.0 in | Wt 165.0 lb

## 2016-07-05 DIAGNOSIS — J45991 Cough variant asthma: Secondary | ICD-10-CM

## 2016-07-05 DIAGNOSIS — R05 Cough: Secondary | ICD-10-CM | POA: Diagnosis not present

## 2016-07-05 DIAGNOSIS — R059 Cough, unspecified: Secondary | ICD-10-CM

## 2016-07-05 LAB — CBC WITH DIFFERENTIAL/PLATELET
BASOS ABS: 0 10*3/uL (ref 0.0–0.1)
BASOS PCT: 0.4 % (ref 0.0–3.0)
Eosinophils Absolute: 1.2 10*3/uL — ABNORMAL HIGH (ref 0.0–0.7)
Eosinophils Relative: 10.5 % — ABNORMAL HIGH (ref 0.0–5.0)
HEMATOCRIT: 41.5 % (ref 36.0–46.0)
HEMOGLOBIN: 14.1 g/dL (ref 12.0–15.0)
LYMPHS PCT: 16.1 % (ref 12.0–46.0)
Lymphs Abs: 1.8 10*3/uL (ref 0.7–4.0)
MCHC: 34.1 g/dL (ref 30.0–36.0)
MCV: 90.6 fl (ref 78.0–100.0)
MONOS PCT: 6.8 % (ref 3.0–12.0)
Monocytes Absolute: 0.8 10*3/uL (ref 0.1–1.0)
NEUTROS ABS: 7.5 10*3/uL (ref 1.4–7.7)
Neutrophils Relative %: 66.2 % (ref 43.0–77.0)
PLATELETS: 273 10*3/uL (ref 150.0–400.0)
RBC: 4.59 Mil/uL (ref 3.87–5.11)
RDW: 15.5 % (ref 11.5–15.5)
WBC: 11.3 10*3/uL — AB (ref 4.0–10.5)

## 2016-07-05 LAB — BASIC METABOLIC PANEL
BUN: 11 mg/dL (ref 6–23)
CHLORIDE: 101 meq/L (ref 96–112)
CO2: 29 mEq/L (ref 19–32)
CREATININE: 0.66 mg/dL (ref 0.40–1.20)
Calcium: 9.1 mg/dL (ref 8.4–10.5)
GFR: 92.05 mL/min (ref >60.00)
GLUCOSE: 108 mg/dL — AB (ref 70–99)
Potassium: 4.4 mEq/L (ref 3.5–5.1)
Sodium: 138 mEq/L (ref 135–145)

## 2016-07-05 LAB — BRAIN NATRIURETIC PEPTIDE: Pro B Natriuretic peptide (BNP): 32 pg/mL (ref 0.0–100.0)

## 2016-07-05 MED ORDER — PREDNISONE 10 MG PO TABS: ORAL_TABLET | ORAL | 0 refills | Status: DC

## 2016-07-05 MED ORDER — AMOXICILLIN-POT CLAVULANATE 875-125 MG PO TABS: 1.0000 | ORAL_TABLET | Freq: Two times a day (BID) | ORAL | 0 refills | Status: AC

## 2016-07-05 NOTE — Assessment & Plan Note (Signed)
Flare with suspected bronchitis Check labs bnp and cbc   Plan  Patient Instructions  Augmentin 875mg  Twice daily  For 7 days -take with food.  Prednisone taper over next week.  Labs today .  Mucinex DM Twice daily  As needed  Cough/congestion  Stay on Symbicort 2 puffs Twice daily   Continue on Protonix and Pepcid .  Follow up with Dr. Sherene SiresWert  In 3 weeks and As needed   Please contact office for sooner follow up if symptoms do not improve or worsen or seek emergency care

## 2016-07-05 NOTE — Progress Notes (Signed)
Chart and office note reviewed in detail - suspect the high FENO is reflective of unaddressed airways inflammation and not a false positive > agree with a/p as outlined

## 2016-07-05 NOTE — Patient Instructions (Addendum)
Augmentin 875mg  Twice daily  For 7 days -take with food.  Prednisone taper over next week.  Labs today .  Mucinex DM Twice daily  As needed  Cough/congestion  Stay on Symbicort 2 puffs Twice daily   Continue on Protonix and Pepcid .  Follow up with Dr. Sherene SiresWert  In 3 weeks and As needed   Please contact office for sooner follow up if symptoms do not improve or worsen or seek emergency care

## 2016-07-05 NOTE — Progress Notes (Signed)
Subjective:    Patient ID: Casey Freeman, female    DOB: 1936/04/13    MRN: [id number]   Brief patient profile:  62  yowf never smoker with h/o watery rhinitis/ cough x sev months typically in fall since around 2009 then new onset sob and cough intially dx as bronchitis late 2013  rx zpak and cough syrup and better but  Then newly  started on prn albuterol for wheezing rarely needed until Aug 2014  referred 10/04/2013 by Dr Kenton Kingfisher for progressively worse  Wheeze/ cough  despite adding symbicort.  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    History of Present Illness  10/04/2013 1st [facility] office visit/ Wert cc daily sob/ cough x 4-5  m assoc with variable sob to point where has trouble sometimes with adls and even sometimes sob at rest if coughing assoc with hoarseness, dry cough and subwheeze day > night. rec Stop linsinopril  benicar 20/12.5 one daily  Try prilosec 81m  Take 30-60 min before first meal of the day and Pepcid 20 mg one bedtime until cough is completely gone for at least a week without the need for cough suppression Best cough medication is delsym GERD diet  Prednisone 10 mg take  4 each am x 2 days,   2 each am x 2 days,  1 each am x 2 days and stop  Only use your albuterol as needed    11/05/2013 f/u ov/Wert re: cough since Fall of 2014  Chief Complaint  Patient presents with  . Follow-up    Pt states that her cough, SOB and wheezing gradually worse since her last visit. Cough is prod with minimal yellow to clear sputum. She is using proair approx 3 x per wk.   if not coughing Sob only with exertion like getting in a real hurry or a flight of steps, much better p last rx with prednisone - cough worse day than night rec Prednisone 10 mg take  4 each am x 2 days,   2 each am x 2 days,  1 each am x 2 days and stop  Dulera 100  Take 2 puffs first thing in am and then another 2 puffs about 12 hours later until you  return Work on inhaler technique:   Continue Prilosec 245m Take 30-60 min before first meal of the day and Pepcid 20 mg one bedtime and chlortrimeton 10m67mne at bedtime  Take delsym two tsp every 12 hours and supplement if needed with  tramadol 50 mg up to 2 every 4 hours   11/25/2013 f/u ov/Wert re:  Cough since Aug 2014 resolved on dulera 100 2bid p rx with pred Chief Complaint  Patient presents with  . Follow-up    Cough has resolved as of a wk ago. She did notice minimal wheeze this am.  Her breathing has improved and back to her normal baseline.   no more tramadol,  Rare proair need.Not limited by breathing from desired activities   rec Finish up your benicar and then rx micardis 80/12.5 one half daily Stop dulera and start symbicort 160 2 pffs first thing am x one month Ok to wean off the acid suppression and only if needed for acid or coughing.    12/23/2013 f/u ov/Wert re: chronic cough/ ok off symbiocort / off acid /on  micardis Chief Complaint  Patient presents with  . Follow-up    Pt reports her cough  is much improved and her breathing is doing well. Has not used any inhalers in the past wk.   Not limited by breathing from desired activities  - no need for saba or cough suppression rec Return for any decline in exercise tolerance or a cough for more than 3 weeks   07/16/2014  Acute  ov/Wert re: recurrent cough on ppi ac one daily/ took another course of macrodantin 06/04/14  Chief Complaint  Patient presents with  . Acute Visit    Pt c/o increased cough with wheezing for the past 10 days.  Cough is prod with minimal to moderate thick, yellow sputum.   She also c/o increased SOB- using proair about 2 x per day.   since previously gradually worse with cough mostly daytime clear to yellow and sp at least  one course of macrodantin x  early Sept and multiple visits for "bronchitis" no better on various abx Cough to point of gagging, sob no better with proair and mostly with  coughing >>pred taper,   delsym and tramadol rx   08/01/2014 Follow and Med review  Patient returns for two-week follow-up for cough Patient was treated with a prednisone taper last visit along with Delsym and tramadol for cough control. Patient does report that she has improved however. Cough is not really gone completely.  Patient does have a history of prolonged Macrodantin use. Her chest x-ray does show bilateral interstitial thickening, most severe at the lung bases, concerning for chronic interstitial lung disease. Patient was instructed not to use Macrodantin any longer. Her sedimentation rate was 32. rec We are setting you up for a CT chest (High Resolution) Do not take Macrodantin -we have placed this on your allergy list.  Follow med calendar closely and bring to each visit.  Take delsym two tsp every 12 hours and supplement if needed with  tramadol 50 mg   09/29/2014 f/u ov/Wert re: pf ? Macrodantin  Chief Complaint  Patient presents with  . Follow-up    PFT done today. Pt states that her breathing is much improved. She has not had to use rescue inhaler at all since last visit.   no need for saba on symbicort 160 2bid and has not lost ground since last pred  rx Not limited by breathing from desired activities   rec Ok to try wean off symbicort ove next sev week s to see if you notice any worse symptoms and if so restart> worse so gradually worked  Back to 2 bid    06/11/2015  Acute ov/Wert re:  PF/ ? Cough variant asthma?  Flare  Chief Complaint  Patient presents with  . Acute Visit    Pt c/o increased cough and wheezing for the past 2 wks. Cough is prod with minimal clear sputum.  She is using using rescue inhaler 1-2 x per day.    one week after stopped symb symtoms recurred and one week prior to OV  Started Back on 2 bid and still needing lots of saba Symptoms are day > noct/ cough mostly dry not using tramadol as per med calendar   >>pred taper , symbicort 80    06/26/2015 Follow up : PF/?Cough variant asthma  Pt returns for 2 week follow up .  Pt seen last ov with flare of cough /asthma Tx w/ prednisone taper  and decreased symbicort 80 .  Feels so much better-100% better.  No ProAir use.  We reviewed all her meds and organized them into a  med calendar with pt education  rec Continue on current regimen  Follow med calendar closely and bring to each visit   09/25/2015  f/u ov/Wert re:  PF with cough variant asthma vs UACS  Chief Complaint  Patient presents with  . Follow-up    Pt states her cough has pretty much resolved. Her breathing is doing well and she rarely uses rescue inhaler.   needed proair with recent uri/ sinus flare but not lately Wants to start cutting down on maint rx as overall feeling much better >>add pepcid as maint and stop ppi   12/11/2015 Acute OV : PF with cough variant asthma  Pt presents for an acute office visit.  Complains of  prod cough with a small amount of yellow mucus, chest tightness/congestion, sinus pressure/drainage, SOB with activity and wheezing x 2 week.  Using delsym and tramadol for cough.  Denies any fever, nausea or vomiting.  Says she has really been doing very well up until 2 weeks ago.  rec Augmentin 84m Twice daily  For 7 days -take with food.  Prednisone taper over next week.      01/26/2016  f/u ov/Wert re: mBenay SpicePF/ ? Cough variant asthma maint rx with symbicort 80 2bid no longer on ppi just pepcid bid  Chief Complaint  Patient presents with  . Follow-up    PFT done today. Pt states her breathing has improved. She has not used albuterol in the past few wks. She c/o some hoarseness today.   doe x vacuuming / sev flights / MMRC1 = can walk nl pace, flat grade, can't hurry or go uphills or steps s sob   rec no change rx   Acute ov / [place]rum / cough  03/01/16  Please take prednisone taper as directed Please take doxycycline 1032mtwice a day for 7 days Continue your existing  medications    03/24/2016  f/u ov/Wert re: PF from macrodantin /uacs vs cough variant asthma  Chief Complaint  Patient presents with  . Follow-up    Cough has improved. She c/o throat irritation and hoarseness since taking doxy prescribed at last visit. Her breathing is some better, but not back to baseline. She is using proair at least once per day on average.   Very confused with contingencies despite med calendar with action plan, using cough drops and lemon for throat. rec Whenever resp symptoms flare > change Pantoprazole (protonix) 40 mg   Take  30-60 min before first meal of the day and Pepcid (famotidine)  20 mg one @  bedtime until 100% better then ok to change back to pepcid 20 mg twice daily  Reduce the symbicort to 2 in am and 2 in pm optional  GERD diet   05/06/2016  NP Follow up : PF from macrodantin vs cough variant asthma  Pt returns for 6 weeks follow up and med review  Seen last with slow to resolve bronchitis /UAC . Was instructed to decrease symbicort. She has some but not everyday b/c it helps her wheezing.  We reviewed all her meds and organized them into a med calendar with pt education  Appears to be taking meds correctly.  Hoarseness and cough persisted . Referred to ENT by PCP .  Seen by Dr. CrErnesto Rutherfordunderwent laryngoscopy told she had inflammed vocal cords.  tx with abx , voice rest . Voice is some better but still hoarse. Cough is no better,  Taking deslym , tramadol . We discussed aggressive cough control regimen.  She denies fever, discolored mucus , hemoptysis or orthopnea.  Weight is down ~10lb, says she is not eating as well. rec Prednisone taper over next week.  Chest xray today .  Follow med calendar closely and bring to each visit.  Take Protonix daily in am  Take pepcid At bedtime .  May use chlortrimeton 4mg  every 4hr as needed for drippy nose, throat clearing and drainage.     06/10/2016  f/u ov/Wert re: PF/ cough variant asthma vs uacs rx symb  80 2bid/ no longer on max gerd rx / using med calendar but ad libbing a bit on maint vs prns Doe = MMRC2 = can't walk a nl pace on a flat grade s sob but does fine slow and flat eg shopping at [employer]   >>change PPI to Pepcid Twice daily  , decreased Symbicort , FENO 75   07/05/2016 Acute OV  Pt presents for an acute office visit. Complains that breathing is worse for last few weeks.  Says over last 4 weeks , cough and DOE picked up . Over last week complains of increased cough, congestion with yellow mucus and DOE. Wears out with walking.  She went back on Pantoprazole and Pepcid and Symbicort 2 Twice daily  Right after symptoms picked back up.  Last office visit. FeNO was 75.  She denies any chest pain, palpitations, orthopnea, PND, increased leg swelling, or fever   Current Medications, Allergies, Complete Past Medical History, Past Surgical History, Family History, and Social History were reviewed in [facility] electronic medical record.  ROS  The following are not active complaints unless bolded sore throat, dysphagia, dental problems, itching, sneezing,  nasal congestion or excess/ purulent secretions, ear ache,   fever, chills, sweats, unintended wt loss, classically pleuritic or exertional cp,  orthopnea pnd or leg swelling, presyncope, palpitations, abdominal pain, anorexia, nausea, vomiting, diarrhea  or change in bowel or bladder habits, change in stools or urine, dysuria,hematuria,  rash, arthralgias, visual complaints, headache, numbness, weakness or ataxia or problems with walking or coordination,  change in mood/affect or memory.                 Objective:   Physical Exam  amb wf nad  / mildly hoarse   Vital signs reviewed :  Vitals:   07/05/16 1157  BP: 108/68  Pulse: 80  Temp: 97.6 F (36.4 C)  TempSrc: Oral  SpO2: 92%  Weight: 165 lb (74.8 kg)  Height: 5\' 2"  (1.575 m)      11/05/2013  184 > 11/25/2013   190 > 12/23/2013  190 > 07/16/2014 174  >172 08/01/2014 >  08/18/2014  171 >  09/29/2014 174 >  03/30/2015 178 > 06/14/2015  178 >  179 06/26/2015 > 09/25/2015 178 > 179 12/11/2015 > 01/26/2016   187 > 03/24/2016   182 >  06/10/2016   171 >165 07/05/2016   HEENT: nl dentition, turbinates, and orophanx. Nl external ear canals without cough reflex   NECK :  without JVD/Nodes/TM/ nl carotid upstrokes bilaterally   LUNGS:     Minimal insp crackles bilaterally on insp  R >  L base s cough on stp   CV:  RRR  no s3 or murmur or increase in P2, tr edema   ABD:  soft and nontender with nl excursion in the supine position. No bruits or organomegaly, bowel sounds nl  MS:  warm without deformities, calf tenderness, cyanosis or clubbing  SKIN: warm and dry without lesions

## 2016-07-06 NOTE — Progress Notes (Signed)
Called spoke with pt. Reviewed results and recs. Pt voiced understanding and had no further questions.

## 2016-07-27 ENCOUNTER — Ambulatory Visit (INDEPENDENT_AMBULATORY_CARE_PROVIDER_SITE_OTHER): Payer: Medicare Other | Admitting: Internal Medicine

## 2016-07-27 ENCOUNTER — Ambulatory Visit (INDEPENDENT_AMBULATORY_CARE_PROVIDER_SITE_OTHER)
Admission: RE | Admit: 2016-07-27 | Discharge: 2016-07-27 | Disposition: A | Discharge status: Home / Self Care | Payer: Medicare Other | Source: Ambulatory Visit | Attending: Internal Medicine | Admitting: Internal Medicine

## 2016-07-27 ENCOUNTER — Encounter: Payer: Self-pay | Admitting: Internal Medicine

## 2016-07-27 ENCOUNTER — Other Ambulatory Visit (INDEPENDENT_AMBULATORY_CARE_PROVIDER_SITE_OTHER): Payer: Medicare Other

## 2016-07-27 VITALS — BP 110/70 | HR 96 | Ht 62.0 in | Wt 164.5 lb

## 2016-07-27 DIAGNOSIS — J45991 Cough variant asthma: Secondary | ICD-10-CM

## 2016-07-27 DIAGNOSIS — J704 Drug-induced interstitial lung disorders, unspecified: Secondary | ICD-10-CM

## 2016-07-27 LAB — CBC WITH DIFFERENTIAL/PLATELET
Basophils Absolute: 0.1 10*3/uL (ref 0.0–0.1)
Basophils Relative: 0.8 % (ref 0.0–3.0)
EOS PCT: 11.4 % — AB (ref 0.0–5.0)
Eosinophils Absolute: 1.2 10*3/uL — ABNORMAL HIGH (ref 0.0–0.7)
HCT: 42.2 % (ref 36.0–46.0)
Hemoglobin: 14.2 g/dL (ref 12.0–15.0)
LYMPHS ABS: 1.9 10*3/uL (ref 0.7–4.0)
Lymphocytes Relative: 17.8 % (ref 12.0–46.0)
MCHC: 33.7 g/dL (ref 30.0–36.0)
MCV: 91.1 fl (ref 78.0–100.0)
MONO ABS: 0.6 10*3/uL (ref 0.1–1.0)
Monocytes Relative: 5.7 % (ref 3.0–12.0)
NEUTROS ABS: 6.7 10*3/uL (ref 1.4–7.7)
Neutrophils Relative %: 64.3 % (ref 43.0–77.0)
Platelets: 277 10*3/uL (ref 150.0–400.0)
RBC: 4.63 Mil/uL (ref 3.87–5.11)
RDW: 15.1 % (ref 11.5–15.5)
WBC: 10.4 10*3/uL (ref 4.0–10.5)

## 2016-07-27 LAB — SEDIMENTATION RATE: SED RATE: 30 mm/h (ref 0–30)

## 2016-07-27 MED ORDER — PREDNISONE 10 MG PO TABS: ORAL_TABLET | ORAL | 0 refills | Status: DC

## 2016-07-27 NOTE — Progress Notes (Signed)
Spoke with pt and notified of results per Dr. Wert. Pt verbalized understanding and denied any questions. 

## 2016-07-27 NOTE — Patient Instructions (Signed)
Please remember to go to the lab and x-ray department downstairs for your tests - we will call you with the results when they are available.    Prednisone 10 mg take 2 daily until breathing and coughing are better then one daily   See calendar for specific medication instructions and bring it back for each and every office visit for every healthcare provider you see.  Without it,  you may not receive the best quality medical care that we feel you deserve.  You will note that the calendar groups together  your maintenance  medications that are timed at particular times of the day.  Think of this as your checklist for what your doctor has instructed you to do until your next evaluation to see what benefit  there is  to staying on a consistent group of medications intended to keep you well.  The other group at the bottom is entirely up to you to use as you see fit  for specific symptoms that may arise between visits that require you to treat them on an as needed basis.  Think of this as your action plan or "what if" list.   Separating the top medications from the bottom group is fundamental to providing you adequate care going forward.    See Tammy NP in 4  weeks with all your medications,  And she will wirte you a new med calendar

## 2016-07-27 NOTE — Progress Notes (Signed)
Subjective:    Patient ID: Casey Freeman, female    DOB: 1936/04/13    MRN: [id number]   Brief patient profile:  62  yowf never smoker with h/o watery rhinitis/ cough x sev months typically in fall since around 2009 then new onset sob and cough intially dx as bronchitis late 2013  rx zpak and cough syrup and better but  Then newly  started on prn albuterol for wheezing rarely needed until Aug 2014  referred 10/04/2013 by Dr Kenton Kingfisher for progressively worse  Wheeze/ cough  despite adding symbicort.  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    History of Present Illness  10/04/2013 1st [facility] office visit/ Casey Freeman cc daily sob/ cough x 4-5  m assoc with variable sob to point where has trouble sometimes with adls and even sometimes sob at rest if coughing assoc with hoarseness, dry cough and subwheeze day > night. rec Stop linsinopril  benicar 20/12.5 one daily  Try prilosec 81m  Take 30-60 min before first meal of the day and Pepcid 20 mg one bedtime until cough is completely gone for at least a week without the need for cough suppression Best cough medication is delsym GERD diet  Prednisone 10 mg take  4 each am x 2 days,   2 each am x 2 days,  1 each am x 2 days and stop  Only use your albuterol as needed    11/05/2013 f/u ov/Casey Freeman re: cough since Fall of 2014  Chief Complaint  Patient presents with  . Follow-up    Pt states that her cough, SOB and wheezing gradually worse since her last visit. Cough is prod with minimal yellow to clear sputum. She is using proair approx 3 x per wk.   if not coughing Sob only with exertion like getting in a real hurry or a flight of steps, much better p last rx with prednisone - cough worse day than night rec Prednisone 10 mg take  4 each am x 2 days,   2 each am x 2 days,  1 each am x 2 days and stop  Dulera 100  Take 2 puffs first thing in am and then another 2 puffs about 12 hours later until you  return Work on inhaler technique:   Continue Prilosec 245m Take 30-60 min before first meal of the day and Pepcid 20 mg one bedtime and chlortrimeton 10m67mne at bedtime  Take delsym two tsp every 12 hours and supplement if needed with  tramadol 50 mg up to 2 every 4 hours   11/25/2013 f/u ov/Casey Freeman re:  Cough since Aug 2014 resolved on dulera 100 2bid p rx with pred Chief Complaint  Patient presents with  . Follow-up    Cough has resolved as of a wk ago. She did notice minimal wheeze this am.  Her breathing has improved and back to her normal baseline.   no more tramadol,  Rare proair need.Not limited by breathing from desired activities   rec Finish up your benicar and then rx micardis 80/12.5 one half daily Stop dulera and start symbicort 160 2 pffs first thing am x one month Ok to wean off the acid suppression and only if needed for acid or coughing.    12/23/2013 f/u ov/Casey Freeman re: chronic cough/ ok off symbiocort / off acid /on  micardis Chief Complaint  Patient presents with  . Follow-up    Pt reports her cough  is much improved and her breathing is doing well. Has not used any inhalers in the past wk.   Not limited by breathing from desired activities  - no need for saba or cough suppression rec Return for any decline in exercise tolerance or a cough for more than 3 weeks   07/16/2014  Acute  ov/Casey Freeman re: recurrent cough on ppi ac one daily/ took another course of macrodantin 06/04/14  Chief Complaint  Patient presents with  . Acute Visit    Pt c/o increased cough with wheezing for the past 10 days.  Cough is prod with minimal to moderate thick, yellow sputum.   She also c/o increased SOB- using proair about 2 x per day.   since previously gradually worse with cough mostly daytime clear to yellow and sp at least  one course of macrodantin x  early Sept and multiple visits for "bronchitis" no better on various abx Cough to point of gagging, sob no better with proair and mostly with  coughing >>pred taper,   delsym and tramadol rx   08/01/2014 Follow and Med review  Patient returns for two-week follow-up for cough Patient was treated with a prednisone taper last visit along with Delsym and tramadol for cough control. Patient does report that she has improved however. Cough is not really gone completely.  Patient does have a history of prolonged Macrodantin use. Her chest x-ray does show bilateral interstitial thickening, most severe at the lung bases, concerning for chronic interstitial lung disease. Patient was instructed not to use Macrodantin any longer. Her sedimentation rate was 32. rec We are setting you up for a CT chest (High Resolution) Do not take Macrodantin -we have placed this on your allergy list.  Follow med calendar closely and bring to each visit.  Take delsym two tsp every 12 hours and supplement if needed with  tramadol 50 mg   09/29/2014 f/u ov/Casey Freeman re: pf ? Macrodantin  Chief Complaint  Patient presents with  . Follow-up    PFT done today. Pt states that her breathing is much improved. She has not had to use rescue inhaler at all since last visit.   no need for saba on symbicort 160 2bid and has not lost ground since last pred  rx Not limited by breathing from desired activities   rec Ok to try wean off symbicort ove next sev week s to see if you notice any worse symptoms and if so restart> worse so gradually worked  Back to 2 bid    06/11/2015  Acute ov/Casey Freeman re:  PF/ ? Cough variant asthma?  Flare  Chief Complaint  Patient presents with  . Acute Visit    Pt c/o increased cough and wheezing for the past 2 wks. Cough is prod with minimal clear sputum.  She is using using rescue inhaler 1-2 x per day.    one week after stopped symb symtoms recurred and one week prior to OV  Started Back on 2 bid and still needing lots of saba Symptoms are day > noct/ cough mostly dry not using tramadol as per med calendar   >>pred taper , symbicort 80    06/26/2015 Follow up : PF/?Cough variant asthma  Pt returns for 2 week follow up .  Pt seen last ov with flare of cough /asthma Tx w/ prednisone taper  and decreased symbicort 80 .  Feels so much better-100% better.  No ProAir use.  We reviewed all her meds and organized them into a  med calendar with pt education  rec Continue on current regimen  Follow med calendar closely and bring to each visit   09/25/2015  f/u ov/Casey Freeman re:  PF with cough variant asthma vs UACS  Chief Complaint  Patient presents with  . Follow-up    Pt states her cough has pretty much resolved. Her breathing is doing well and she rarely uses rescue inhaler.   needed proair with recent uri/ sinus flare but not lately Wants to start cutting down on maint rx as overall feeling much better >>add pepcid as maint and stop ppi   12/11/2015 Acute OV : PF with cough variant asthma  Pt presents for an acute office visit.  Complains of  prod cough with a small amount of yellow mucus, chest tightness/congestion, sinus pressure/drainage, SOB with activity and wheezing x 2 week.  Using delsym and tramadol for cough.  Denies any fever, nausea or vomiting.  Says she has really been doing very well up until 2 weeks ago.  rec Augmentin 84m Twice daily  For 7 days -take with food.  Prednisone taper over next week.      01/26/2016  f/u ov/Casey Freeman re: mBenay SpicePF/ ? Cough variant asthma maint rx with symbicort 80 2bid no longer on ppi just pepcid bid  Chief Complaint  Patient presents with  . Follow-up    PFT done today. Pt states her breathing has improved. She has not used albuterol in the past few wks. She c/o some hoarseness today.   doe x vacuuming / sev flights / MMRC1 = can walk nl pace, flat grade, can't hurry or go uphills or steps s sob   rec no change rx   Acute ov / [place] / cough  03/01/16  Please take prednisone taper as directed Please take doxycycline 1032mtwice a day for 7 days Continue your existing  medications    03/24/2016  f/u ov/Casey Freeman re: PF from macrodantin /uacs vs cough variant asthma  Chief Complaint  Patient presents with  . Follow-up    Cough has improved. She c/o throat irritation and hoarseness since taking doxy prescribed at last visit. Her breathing is some better, but not back to baseline. She is using proair at least once per day on average.   Very confused with contingencies despite med calendar with action plan, using cough drops and lemon for throat. rec Whenever resp symptoms flare > change Pantoprazole (protonix) 40 mg   Take  30-60 min before first meal of the day and Pepcid (famotidine)  20 mg one @  bedtime until 100% better then ok to change back to pepcid 20 mg twice daily  Reduce the symbicort to 2 in am and 2 in pm optional  GERD diet   05/06/2016  NP Follow up : PF from macrodantin vs cough variant asthma  Pt returns for 6 weeks follow up and med review  Seen last with slow to resolve bronchitis /UAC . Was instructed to decrease symbicort. She has some but not everyday b/c it helps her wheezing.  We reviewed all her meds and organized them into a med calendar with pt education  Appears to be taking meds correctly.  Hoarseness and cough persisted . Referred to ENT by PCP .  Seen by Dr. CrErnesto Rutherfordunderwent laryngoscopy told she had inflammed vocal cords.  tx with abx , voice rest . Voice is some better but still hoarse. Cough is no better,  Taking deslym , tramadol . We discussed aggressive cough control regimen.  She denies fever, discolored mucus , hemoptysis or orthopnea.  Weight is down ~10lb, says she is not eating as well. rec Prednisone taper over next week.  Chest xray today .  Follow med calendar closely and bring to each visit.  Take Protonix daily in am  Take pepcid At bedtime .  May use chlortrimeton 4mg  every 4hr as needed for drippy nose, throat clearing and drainage.     06/10/2016  f/u ov/Casey Freeman re: PF/ cough variant asthma vs uacs rx symb  80 2bid/ no longer on max gerd rx / using med calendar but ad libbing a bit on maint vs prns Doe = MMRC2 = can't walk a nl pace on a flat grade s sob but does fine slow and flat eg shopping at [employer]   >>change PPI to Pepcid Twice daily, decreased Symbicort , FENO 75   07/05/2016  NP  Acute OV  Pt presents for an acute office visit. Complains that breathing is worse for last few weeks.  Says over last 4 weeks , cough and DOE picked up . Over last week complains of increased cough, congestion with yellow mucus and DOE. Wears out with walking.  She went back on Pantoprazole and Pepcid and Symbicort 2 Twice daily  Right after symptoms picked back up.  Last office visit. FeNO was 75.  She denies any chest pain, palpitations, orthopnea, PND, increased leg swelling, or fever Augmentin 875mg  Twice daily  For 7 days -take with food.  Prednisone taper over next week.  Mucinex DM Twice daily  As needed  Cough/congestion  Stay on Symbicort 2 puffs Twice daily   Continue on Protonix and Pepcid .    07/27/2016  f/u ov/Casey Freeman re: persistent cough /sob in pt with PF s/p macrodantin and prob asthma on symb 4  Chief Complaint  Patient presents with  . Follow-up    Cough not improving at all. She is feeling more SOB. She was winded walking just lobby to exam room today.    sob across the room / cough is mostly clear day >> noct  Always better on prednisone but worse as tapers off  No obvious day to day or daytime variability or assoc excess/ purulent sputum or mucus plugs or hemoptysis or cp or chest tightness, subjective wheeze or overt sinus or hb symptoms. No unusual exp hx or h/o childhood pna/ asthma or knowledge of premature birth.  Sleeping ok without nocturnal  or early am exacerbation  of respiratory  c/o's or need for noct saba. Also denies any obvious fluctuation of symptoms with weather or environmental changes or other aggravating or alleviating factors except as outlined above   Current  Medications, Allergies, Complete Past Medical History, Past Surgical History, Family History, and Social History were reviewed in [employer] record.  ROS  The following are not active complaints unless bolded sore throat, dysphagia, dental problems, itching, sneezing,  nasal congestion or excess/ purulent secretions, ear ache,   fever, chills, sweats, unintended wt loss, classically pleuritic or exertional cp,  orthopnea pnd or leg swelling, presyncope, palpitations, abdominal pain, anorexia, nausea, vomiting, diarrhea  or change in bowel or bladder habits, change in stools or urine, dysuria,hematuria,  rash, arthralgias, visual complaints, headache, numbness, weakness or ataxia or problems with walking or coordination,  change in mood/affect or memory.                     Objective:   Physical Exam  amb wf nad  /  mildly hoarse   Vital signs reviewed : - Note on arrival 02 sats  93% on RA      11/05/2013  184 > 11/25/2013   190 > 12/23/2013  190 > 07/16/2014 174  >172 08/01/2014 > 08/18/2014  171 >  09/29/2014 174 >  03/30/2015 178 > 06/14/2015  178 >  179 06/26/2015 > 09/25/2015 178 > 179 12/11/2015 > 01/26/2016   187 > 03/24/2016   182 >  06/10/2016   171 >  07/05/2016 > 07/27/2016 165   HEENT: nl dentition, turbinates, and orophanx. Nl external ear canals without cough reflex   NECK :  without JVD/Nodes/TM/ nl carotid upstrokes bilaterally   LUNGS:     Minimal insp crackles bilaterally on insp  R >  L base s cough on stp   CV:  RRR  no s3 or murmur or increase in P2, tr edema   ABD:  soft and nontender with nl excursion in the supine position. No bruits or organomegaly, bowel sounds nl  MS:  warm without deformities, calf tenderness, cyanosis or clubbing  SKIN: warm and dry without lesions     CXR PA and Lateral:   07/27/2016 :    I personally reviewed images and agree with radiology impression as follows:    No appreciable change in extensive patchy  reticular opacities throughout both lungs consistent with chronic interstitial lung disease. No acute consolidative airspace disease.    Labs ordered/ reviewed:    Chemistry      Component Value Date/Time   NA 138 07/05/2016 1249   K 4.4 07/05/2016 1249   CL 101 07/05/2016 1249   CO2 29 07/05/2016 1249   BUN 11 07/05/2016 1249   CREATININE 0.66 07/05/2016 1249      Component Value Date/Time   CALCIUM 9.1 07/05/2016 1249   ALKPHOS 72 04/23/2012 0935   AST 16 04/23/2012 0935   ALT 9 04/23/2012 0935   BILITOT 0.5 04/23/2012 0935        Lab Results  Component Value Date   WBC 11.3 (H) 07/05/2016   HGB 14.1 07/05/2016   HCT 41.5 07/05/2016   MCV 90.6 07/05/2016   PLT 273.0 07/05/2016       Lab Results  Component Value Date   PROBNP 32.0 07/05/2016          EOS      07/27/2016   = 1.2  Absolute    Lab Results  Component Value Date   ESRSEDRATE 30 07/27/2016   ESRSEDRATE 32 (H) 07/16/2014

## 2016-07-28 ENCOUNTER — Ambulatory Visit: Payer: Medicare Other | Admitting: Internal Medicine

## 2016-07-28 LAB — RESPIRATORY ALLERGY PROFILE REGION II ~~LOC~~
ALLERGEN, D PTERNOYSSINUS, D1: 0.58 kU/L — AB
Allergen, C. Herbarum, M2: <0.1 kU/L
Allergen, Comm Silver Birch, t9: <0.1 kU/L
Allergen, Cottonwood, t14: <0.1 kU/L
Allergen, Mulberry, t76: <0.1 kU/L
Allergen, P. notatum, m1: <0.1 kU/L
Aspergillus fumigatus, m3: <0.1 kU/L
Bermuda Grass: <0.1 kU/L
Cockroach: 0.13 kU/L — ABNORMAL HIGH
Common Ragweed: <0.1 kU/L
D. FARINAE: 1.14 kU/L — AB
IgE (Immunoglobulin E), Serum: 643 kU/L — ABNORMAL HIGH (ref <115)
ROUGH PIGWEED IGE: 0.13 kU/L — AB
Timothy Grass: <0.1 kU/L

## 2016-07-29 NOTE — Assessment & Plan Note (Signed)
-   hfa 75% 11/05/13 > rx dulera 100 2bid > changed to symbicort 160 2bid 11/25/13 > improved 09/29/2014 > ok to taper off as of 03/30/15 > flared off - Allergy profile 11/05/2013 >  Eos 6.1%  IgE 363 but RAST only pos to dust - NO 09/25/2015  = 31  - 09/25/2015    try off pm symbicort  - FENO 06/10/2016  =   75 on symb 80 2bid though hfa not optimal  - 07/27/2016  After extensive coaching HFA effectiveness =  75 % (Ti too short)  - Allergy profile 07/27/16  >  Eos 1.2 /  IgE  643 RAST pos dust > cockroach    May benefit from higher dose of ICS if we can control the cough first - other options are trial of singulair or refer to allergy / [place]  I had an extended discussion with the patient reviewing all relevant studies completed to date and  lasting 15 to 20 minutes of a 25 minute visit    Each maintenance medication was reviewed in detail including most importantly the difference between maintenance and prns and under what circumstances the prns are to be triggered using an action plan format that is not reflected in the computer generated alphabetically organized AVS.    Please see instructions for details which were reviewed in writing and the patient given a copy highlighting the part that I personally wrote and discussed at today's ov.

## 2016-07-29 NOTE — Progress Notes (Signed)
Spoke with pt and notified of results per Dr. Wert. Pt verbalized understanding and denied any questions. 

## 2016-07-29 NOTE — Assessment & Plan Note (Signed)
See cxr 09/18/13 - rec avoid all macrodantin exp 12/23/2013  - recurrent macrodantin exposure last filled rx  06/2014 [facility] >cxr much worse 07/16/14 with ESR 32 > started short course prednisone  - med calendar 08/01/2014  -CT chest 08/06/14 >Pulmonary parenchymal pattern of fibrosis, as described above, appears progressive when compared with chest radiographs dating back to 07/27/2009. 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 Findings may be post inflammatory in etiology. - 08/29/2014  Walked RA x 3 laps @ 185 ft each stopped due to  End of study mod pace, sats 91% - 09/29/2014  Walked RA x 3 laps @ 185 ft each stopped due to end of study sat 93% and fast pace   - 09/29/2014 PFTs  VC 1.70 (64%) s obst and dlco 57 corrects to 98% - 03/30/2015   PFTs  VC 1.98 (75%) s obst and dlco 66 corrects to 107  - PFT's  01/26/2016   VC 1.71 (65%) s obst   with DLCO  65/63 % corrects to 104  % for alv volume        ? If there is a steroid dep component?  The goal with a chronic steroid dependent illness is always arriving at the lowest effective dose that controls the disease/symptoms and not accepting a set "formula" which is based on statistics or guidelines that don't always take into account patient  variability or the natural hx of the dz in every individual patient, which may well vary over time.  For now therefore I recommend the patient maintain  20 mg until bette then floor of 10 mg daily

## 2016-08-30 ENCOUNTER — Encounter: Payer: Self-pay | Admitting: Adult Health

## 2016-08-30 ENCOUNTER — Ambulatory Visit (INDEPENDENT_AMBULATORY_CARE_PROVIDER_SITE_OTHER): Payer: Medicare Other | Admitting: Adult Health

## 2016-08-30 DIAGNOSIS — J45991 Cough variant asthma: Secondary | ICD-10-CM | POA: Diagnosis not present

## 2016-08-30 LAB — NITRIC OXIDE: NITRIC OXIDE: 36

## 2016-08-30 MED ORDER — BUDESONIDE-FORMOTEROL FUMARATE 160-4.5 MCG/ACT IN AERO: 2.0000 | INHALATION_SPRAY | Freq: Two times a day (BID) | RESPIRATORY_TRACT | 0 refills | Status: DC

## 2016-08-30 NOTE — Assessment & Plan Note (Signed)
Recent flare -difficult to control with AR triggers.  She is improved but on daily prednisone.  FeNO is improving . Will control triggers more aggressively if unable to wean off steroids at next ov , would refer to allergist.   Plan  Patient Instructions  Increase Symbicort 160 2 puffs Twice daily   Can use chlor tabs in additon to claritin to control drippy nose.  Add Flonase 2 puffs daily in am .  Follow med calendar closely and bring to each visit.  Follow up with Dr. Sherene SiresWert  In 4 weeks and As needed   Please contact office for sooner follow up if symptoms do not improve or worsen or seek emergency care

## 2016-08-30 NOTE — Addendum Note (Signed)
Addended by: Abigail MiyamotoPHELPS, DENISE D on: 08/30/2016 11:55 AM   Modules accepted: Orders

## 2016-08-30 NOTE — Patient Instructions (Addendum)
Increase Symbicort 160 2 puffs Twice daily   Can use chlor tabs in additon to claritin to control drippy nose.  Add Flonase 2 puffs daily in am .  Follow med calendar closely and bring to each visit.  Follow up with Dr. Sherene SiresWert  In 4 weeks and As needed   Please contact office for sooner follow up if symptoms do not improve or worsen or seek emergency care

## 2016-08-30 NOTE — Progress Notes (Signed)
Subjective:    Patient ID: Casey Freeman, female    DOB: 1936/04/13    MRN: [id number]   Brief patient profile:  62  yowf never smoker with h/o watery rhinitis/ cough x sev months typically in fall since around 2009 then new onset sob and cough intially dx as bronchitis late 2013  rx zpak and cough syrup and better but  Then newly  started on prn albuterol for wheezing rarely needed until Aug 2014  referred 10/04/2013 by Dr Kenton Kingfisher for progressively worse  Wheeze/ cough  despite adding symbicort.  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    History of Present Illness  10/04/2013 1st [facility] office visit/ Wert cc daily sob/ cough x 4-5  m assoc with variable sob to point where has trouble sometimes with adls and even sometimes sob at rest if coughing assoc with hoarseness, dry cough and subwheeze day > night. rec Stop linsinopril  benicar 20/12.5 one daily  Try prilosec 81m  Take 30-60 min before first meal of the day and Pepcid 20 mg one bedtime until cough is completely gone for at least a week without the need for cough suppression Best cough medication is delsym GERD diet  Prednisone 10 mg take  4 each am x 2 days,   2 each am x 2 days,  1 each am x 2 days and stop  Only use your albuterol as needed    11/05/2013 f/u ov/Wert re: cough since Fall of 2014  Chief Complaint  Patient presents with  . Follow-up    Pt states that her cough, SOB and wheezing gradually worse since her last visit. Cough is prod with minimal yellow to clear sputum. She is using proair approx 3 x per wk.   if not coughing Sob only with exertion like getting in a real hurry or a flight of steps, much better p last rx with prednisone - cough worse day than night rec Prednisone 10 mg take  4 each am x 2 days,   2 each am x 2 days,  1 each am x 2 days and stop  Dulera 100  Take 2 puffs first thing in am and then another 2 puffs about 12 hours later until you  return Work on inhaler technique:   Continue Prilosec 245m Take 30-60 min before first meal of the day and Pepcid 20 mg one bedtime and chlortrimeton 10m67mne at bedtime  Take delsym two tsp every 12 hours and supplement if needed with  tramadol 50 mg up to 2 every 4 hours   11/25/2013 f/u ov/Wert re:  Cough since Aug 2014 resolved on dulera 100 2bid p rx with pred Chief Complaint  Patient presents with  . Follow-up    Cough has resolved as of a wk ago. She did notice minimal wheeze this am.  Her breathing has improved and back to her normal baseline.   no more tramadol,  Rare proair need.Not limited by breathing from desired activities   rec Finish up your benicar and then rx micardis 80/12.5 one half daily Stop dulera and start symbicort 160 2 pffs first thing am x one month Ok to wean off the acid suppression and only if needed for acid or coughing.    12/23/2013 f/u ov/Wert re: chronic cough/ ok off symbiocort / off acid /on  micardis Chief Complaint  Patient presents with  . Follow-up    Pt reports her cough  is much improved and her breathing is doing well. Has not used any inhalers in the past wk.   Not limited by breathing from desired activities  - no need for saba or cough suppression rec Return for any decline in exercise tolerance or a cough for more than 3 weeks   07/16/2014  Acute  ov/Wert re: recurrent cough on ppi ac one daily/ took another course of macrodantin 06/04/14  Chief Complaint  Patient presents with  . Acute Visit    Pt c/o increased cough with wheezing for the past 10 days.  Cough is prod with minimal to moderate thick, yellow sputum.   She also c/o increased SOB- using proair about 2 x per day.   since previously gradually worse with cough mostly daytime clear to yellow and sp at least  one course of macrodantin x  early Sept and multiple visits for "bronchitis" no better on various abx Cough to point of gagging, sob no better with proair and mostly with  coughing >>pred taper,   delsym and tramadol rx   08/01/2014 Follow and Med review  Patient returns for two-week follow-up for cough Patient was treated with a prednisone taper last visit along with Delsym and tramadol for cough control. Patient does report that she has improved however. Cough is not really gone completely.  Patient does have a history of prolonged Macrodantin use. Her chest x-ray does show bilateral interstitial thickening, most severe at the lung bases, concerning for chronic interstitial lung disease. Patient was instructed not to use Macrodantin any longer. Her sedimentation rate was 32. rec We are setting you up for a CT chest (High Resolution) Do not take Macrodantin -we have placed this on your allergy list.  Follow med calendar closely and bring to each visit.  Take delsym two tsp every 12 hours and supplement if needed with  tramadol 50 mg   09/29/2014 f/u ov/Wert re: pf ? Macrodantin  Chief Complaint  Patient presents with  . Follow-up    PFT done today. Pt states that her breathing is much improved. She has not had to use rescue inhaler at all since last visit.   no need for saba on symbicort 160 2bid and has not lost ground since last pred  rx Not limited by breathing from desired activities   rec Ok to try wean off symbicort ove next sev week s to see if you notice any worse symptoms and if so restart> worse so gradually worked  Back to 2 bid    06/11/2015  Acute ov/Wert re:  PF/ ? Cough variant asthma?  Flare  Chief Complaint  Patient presents with  . Acute Visit    Pt c/o increased cough and wheezing for the past 2 wks. Cough is prod with minimal clear sputum.  She is using using rescue inhaler 1-2 x per day.    one week after stopped symb symtoms recurred and one week prior to OV  Started Back on 2 bid and still needing lots of saba Symptoms are day > noct/ cough mostly dry not using tramadol as per med calendar   >>pred taper , symbicort 80    06/26/2015 Follow up : PF/?Cough variant asthma  Pt returns for 2 week follow up .  Pt seen last ov with flare of cough /asthma Tx w/ prednisone taper  and decreased symbicort 80 .  Feels so much better-100% better.  No ProAir use.  We reviewed all her meds and organized them into a  med calendar with pt education  rec Continue on current regimen  Follow med calendar closely and bring to each visit   09/25/2015  f/u ov/Wert re:  PF with cough variant asthma vs UACS  Chief Complaint  Patient presents with  . Follow-up    Pt states her cough has pretty much resolved. Her breathing is doing well and she rarely uses rescue inhaler.   needed proair with recent uri/ sinus flare but not lately Wants to start cutting down on maint rx as overall feeling much better >>add pepcid as maint and stop ppi   12/11/2015 Acute OV : PF with cough variant asthma  Pt presents for an acute office visit.  Complains of  prod cough with a small amount of yellow mucus, chest tightness/congestion, sinus pressure/drainage, SOB with activity and wheezing x 2 week.  Using delsym and tramadol for cough.  Denies any fever, nausea or vomiting.  Says she has really been doing very well up until 2 weeks ago.  rec Augmentin 84m Twice daily  For 7 days -take with food.  Prednisone taper over next week.      01/26/2016  f/u ov/Wert re: mBenay SpicePF/ ? Cough variant asthma maint rx with symbicort 80 2bid no longer on ppi just pepcid bid  Chief Complaint  Patient presents with  . Follow-up    PFT done today. Pt states her breathing has improved. She has not used albuterol in the past few wks. She c/o some hoarseness today.   doe x vacuuming / sev flights / MMRC1 = can walk nl pace, flat grade, can't hurry or go uphills or steps s sob   rec no change rx   Acute ov / Byrum / cough  03/01/16  Please take prednisone taper as directed Please take doxycycline 1032mtwice a day for 7 days Continue your existing  medications    03/24/2016  f/u ov/Wert re: PF from macrodantin /uacs vs cough variant asthma  Chief Complaint  Patient presents with  . Follow-up    Cough has improved. She c/o throat irritation and hoarseness since taking doxy prescribed at last visit. Her breathing is some better, but not back to baseline. She is using proair at least once per day on average.   Very confused with contingencies despite med calendar with action plan, using cough drops and lemon for throat. rec Whenever resp symptoms flare > change Pantoprazole (protonix) 40 mg   Take  30-60 min before first meal of the day and Pepcid (famotidine)  20 mg one @  bedtime until 100% better then ok to change back to pepcid 20 mg twice daily  Reduce the symbicort to 2 in am and 2 in pm optional  GERD diet   05/06/2016  NP Follow up : PF from macrodantin vs cough variant asthma  Pt returns for 6 weeks follow up and med review  Seen last with slow to resolve bronchitis /UAC . Was instructed to decrease symbicort. She has some but not everyday b/c it helps her wheezing.  We reviewed all her meds and organized them into a med calendar with pt education  Appears to be taking meds correctly.  Hoarseness and cough persisted . Referred to ENT by PCP .  Seen by Dr. CrErnesto Rutherfordunderwent laryngoscopy told she had inflammed vocal cords.  tx with abx , voice rest . Voice is some better but still hoarse. Cough is no better,  Taking deslym , tramadol . We discussed aggressive cough control regimen.  She denies fever, discolored mucus , hemoptysis or orthopnea.  Weight is down ~10lb, says she is not eating as well. rec Prednisone taper over next week.  Chest xray today .  Follow med calendar closely and bring to each visit.  Take Protonix daily in am  Take pepcid At bedtime .  May use chlortrimeton 3m every 4hr as needed for drippy nose, throat clearing and drainage.     06/10/2016  f/u ov/Wert re: PF/ cough variant asthma vs uacs rx symb  80 2bid/ no longer on max gerd rx / using med calendar but ad libbing a bit on maint vs prns Doe = MMRC2 = can't walk a nl pace on a flat grade s sob but does fine slow and flat eg shopping at [employer]   >>change PPI to Pepcid Twice daily, decreased Symbicort , FENO 75   07/05/2016  NP  Acute OV  Pt presents for an acute office visit. Complains that breathing is worse for last few weeks.  Says over last 4 weeks , cough and DOE picked up . Over last week complains of increased cough, congestion with yellow mucus and DOE. Wears out with walking.  She went back on Pantoprazole and Pepcid and Symbicort 2 Twice daily  Right after symptoms picked back up.  Last office visit. FeNO was 75.  She denies any chest pain, palpitations, orthopnea, PND, increased leg swelling, or fever Augmentin 8758mTwice daily  For 7 days -take with food.  Prednisone taper over next week.  Mucinex DM Twice daily  As needed  Cough/congestion  Stay on Symbicort 2 puffs Twice daily   Continue on Protonix and Pepcid .    07/27/2016  f/u ov/Wert re: persistent cough /sob in pt with PF s/p macrodantin and prob asthma on symb [id number] Complaint  Patient presents with  . Follow-up    Cough not improving at all. She is feeling more SOB. She was winded walking just lobby to exam room today.    sob across the room / cough is mostly clear day >> noct  Always better on prednisone but worse as tapers off >>pred 2024mhen hold 93m64men better.    08/30/2016 Follow up : Cough/PF-?macrodantin related/Asthma  Pt returns for 1 month follow up . Seen last ov with cough flare . Started on prednisone 20mg61mly until better then hold at 93mg 65my.  CXR last ov with chronic changes noted.  Allergy profile postiivie to dust, IgE 643, Eosiniophils elevated. ESR 30. FeNO was elevated at 75 . Today FeNO is 36 She is feeling much better. Decreased cough , dyspnea and wheezing . No recent SABA use. Says she keeps a drippy nose . Says chlor tabs  have not been working as good. Did start on claritin which has helped. Uses chlor tabs at night .  She denies chest pain, orthopnea, edema or fever.  Remains on Symbicort 80.  She has been keeping a log at home for symptoms. Feels the drippy nose is the culprit to her cough /wheezing symptoms. If she controls this then her cough is better controlled.  We reviewed all her meds and organized them into a med calendar with pt education.     Current Medications, Allergies, Complete Past Medical History, Past Surgical History, Family History, and Social History were reviewed in [employer].  ROS  The following are not active complaints unless bolded sore throat, dysphagia, dental problems, itching, sneezing,  nasal congestion or excess/  purulent secretions, ear ache,   fever, chills, sweats, unintended wt loss, classically pleuritic or exertional cp,  orthopnea pnd or leg swelling, presyncope, palpitations, abdominal pain, anorexia, nausea, vomiting, diarrhea  or change in bowel or bladder habits, change in stools or urine, dysuria,hematuria,  rash, arthralgias, visual complaints, headache, numbness, weakness or ataxia or problems with walking or coordination,  change in mood/affect or memory.                     Objective:   Physical Exam  amb wf nad   Vital signs reviewed : - Note on arrival 02 sats  95% on RA    Vitals:   08/30/16 1032  BP: 138/78  Pulse: 75  Temp: 97.6 F (36.4 C)  TempSrc: Oral  SpO2: 95%  Weight: 167 lb 6.4 oz (75.9 kg)  Height: 5' 2"  (1.575 m)      11/05/2013  184 > 11/25/2013   190 > 12/23/2013  190 > 07/16/2014 174  >172 08/01/2014 > 08/18/2014  171 >  09/29/2014 174 >  03/30/2015 178 > 06/14/2015  178 >  179 06/26/2015 > 09/25/2015 178 > 179 12/11/2015 > 01/26/2016   187 > 03/24/2016   182 >  06/10/2016   171 >  07/05/2016 > 07/27/2016 165   HEENT: nl dentition, turbinates, and orophanx. Nl external ear canals without cough  reflex   NECK :  without JVD/Nodes/TM/ nl carotid upstrokes bilaterally   LUNGS:     Minimal insp crackles bilaterally on insp  R >  L base s cough on stp   CV:  RRR  no s3 or murmur or increase in P2, tr edema   ABD:  soft and nontender with nl excursion in the supine position. No bruits or organomegaly, bowel sounds nl  MS:  warm without deformities, calf tenderness, cyanosis or clubbing  SKIN: warm and dry without lesions     CXR PA and Lateral:   07/27/2016 :    No appreciable change in extensive patchy reticular opacities throughout both lungs consistent with chronic interstitial lung disease. No acute consolidative airspace disease.    Labs ordered/ reviewed:    Chemistry      Component Value Date/Time   NA 138 07/05/2016 1249   K 4.4 07/05/2016 1249   CL 101 07/05/2016 1249   CO2 29 07/05/2016 1249   BUN 11 07/05/2016 1249   CREATININE 0.66 07/05/2016 1249      Component Value Date/Time   CALCIUM 9.1 07/05/2016 1249   ALKPHOS 72 04/23/2012 0935   AST 16 04/23/2012 0935   ALT 9 04/23/2012 0935   BILITOT 0.5 04/23/2012 0935        Lab Results  Component Value Date   WBC 11.3 (H) 07/05/2016   HGB 14.1 07/05/2016   HCT 41.5 07/05/2016   MCV 90.6 07/05/2016   PLT 273.0 07/05/2016       Lab Results  Component Value Date   PROBNP 32.0 07/05/2016          EOS      07/27/2016   = 1.2  Absolute    Lab Results  Component Value Date   ESRSEDRATE 30 07/27/2016   ESRSEDRATE 32 (H) 07/16/2014     Kaelah Hayashi NP-C  [facility]   08/30/2016

## 2016-09-09 ENCOUNTER — Ambulatory Visit: Payer: Medicare Other | Admitting: Internal Medicine

## 2016-09-13 NOTE — Addendum Note (Signed)
Addended by: Abigail MiyamotoPHELPS, DENISE D on: 09/13/2016 09:34 AM   Modules accepted: Orders

## 2016-09-27 ENCOUNTER — Encounter: Payer: Self-pay | Admitting: Internal Medicine

## 2016-09-27 ENCOUNTER — Ambulatory Visit (INDEPENDENT_AMBULATORY_CARE_PROVIDER_SITE_OTHER): Payer: Medicare Other | Admitting: Internal Medicine

## 2016-09-27 VITALS — BP 128/80 | HR 84 | Ht 62.0 in | Wt 174.4 lb

## 2016-09-27 DIAGNOSIS — J45991 Cough variant asthma: Secondary | ICD-10-CM

## 2016-09-27 DIAGNOSIS — J704 Drug-induced interstitial lung disorders, unspecified: Secondary | ICD-10-CM

## 2016-09-27 MED ORDER — BUDESONIDE-FORMOTEROL FUMARATE 160-4.5 MCG/ACT IN AERO: 2.0000 | INHALATION_SPRAY | Freq: Two times a day (BID) | RESPIRATORY_TRACT | 11 refills | Status: DC

## 2016-09-27 NOTE — Patient Instructions (Addendum)
When doing better, reduce the prednisone to 10 mg one half daily - if doing worse, increase to 2 daily   Work on inhaler technique:  relax and gently blow all the way out then take a nice smooth deep breath back in, triggering the inhaler at same time you start breathing in.  Hold for up to 5 seconds if you can. Blow out thru nose. Rinse and gargle with water when done  See calendar for specific medication instructions and bring it back for each and every office visit for every healthcare provider you see.  Without it,  you may not receive the best quality medical care that we feel you deserve.  You will note that the calendar groups together  your maintenance  medications that are timed at particular times of the day.  Think of this as your checklist for what your doctor has instructed you to do until your next evaluation to see what benefit  there is  to staying on a consistent group of medications intended to keep you well.  The other group at the bottom is entirely up to you to use as you see fit  for specific symptoms that may arise between visits that require you to treat them on an as needed basis.  Think of this as your action plan or "what if" list.   Separating the top medications from the bottom group is fundamental to providing you adequate care going forward.    Please schedule a follow up visit in 3 months but call sooner if needed pfts on return

## 2016-09-27 NOTE — Progress Notes (Signed)
Subjective:    Patient ID: Casey Freeman, Casey    DOB: Apr 30, 1936    MRN: [id number]   Brief patient profile:  7  yowf never smoker with h/o watery rhinitis/ cough x sev months typically in fall since around 2009 then new onset sob and cough intially dx as bronchitis late 2013  rx zpak and cough syrup and better but  Then newly  started on prn albuterol for wheezing rarely needed until Aug 2014  referred 10/04/2013 by Dr Casey Freeman for progressively worse  Wheeze/ cough  despite adding symbicort.  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    History of Present Illness  10/04/2013 1st [facility] office visit/ Casey Freeman cc daily sob/ cough x 4-5  m assoc with variable sob to point where has trouble sometimes with adls and even sometimes sob at rest if coughing assoc with hoarseness, dry cough and subwheeze day > night. rec Stop linsinopril  benicar 20/12.5 one daily  Try prilosec 53m  Take 30-60 min before first meal of the day and Pepcid 20 mg one bedtime until cough is completely gone for at least a week without the need for cough suppression Best cough medication is delsym GERD diet  Prednisone 10 mg take  4 each am x 2 days,   2 each am x 2 days,  1 each am x 2 days and stop  Only use your albuterol as needed     07/27/2016  f/u ov/Casey Freeman re: persistent cough /sob in pt with PF s/p macrodantin and prob asthma on symb 814 Chief Complaint  Patient presents with  . Follow-Freeman    Cough not improving at all. She is feeling more SOB. She was winded walking just lobby to exam room today.    sob across the room / cough is mostly clear day >> noct  Always better on prednisone but worse as tapers off >>pred 267mthen hold 1028mhen better.     08/30/2016 Casey Freeman : Cough/PF-?macrodantin related/Asthma   Allergy profile postiivie to dust, IgE 643, Eosiniophils elevated. ESR 30. FeNO was elevated at 75 . Today FeNO is 36 She is feeling  much better. Decreased cough , dyspnea and wheezing . No recent SABA use. Says she keeps a drippy nose . Says chlor tabs have not been working   Did start on claritin which has helped. Uses chlor tabs at night .   Remains on Symbicort 80.  She has been keeping a log at home for symptoms. Feels the drippy nose causing cough /wheezing symptoms. If she controls this then her cough is better controlled.  We reviewed all her meds and organized them into a med calendar   rec Increase Symbicort 160 2 puffs Twice daily   Can use chlor tabs in additon to claritin to control drippy nose.  Add Flonase 2 puffs daily in am .  Follow med calendar closely and bring to each visit.     09/27/2016  f/u ov/Casey Freeman re: pf/ AB/pnds on maint rx with pred 10 mg daily and symb 160 2bid  Chief Complaint  Patient presents with  . Follow-Freeman    4 weeks, still SOB and has a cough from drainage, she does feel better    [facility] on a flat grade s sob but does fine slow and flat eg ok at HT Has med calendar and following   Back to baseline doe/ mild  sense of pnds but s excess/ purulent sputum or mucus plugs   No obvious day to day or daytime variability or assoc e  cp or chest tightness, subjective wheeze or overt sinus or hb symptoms. No unusual exp hx or h/o childhood pna/ asthma or knowledge of premature birth.  Sleeping ok without nocturnal  or early am exacerbation  of respiratory  c/o's or need for noct saba. Also denies any obvious fluctuation of symptoms with weather or environmental changes or other aggravating or alleviating factors except as outlined above   Current Medications, Allergies, Complete Past Medical History, Past Surgical History, Family History, and Social History were reviewed in [employer] record.  ROS  The following are not active complaints unless bolded sore throat, dysphagia, dental problems, itching, sneezing,  nasal congestion or excess/ purulent  secretions, ear ache,   fever, chills, sweats, unintended wt loss, classically pleuritic or exertional cp,  orthopnea pnd or leg swelling, presyncope, palpitations, abdominal pain, anorexia, nausea, vomiting, diarrhea  or change in bowel or bladder habits, change in stools or urine, dysuria,hematuria,  rash, arthralgias, visual complaints, headache, numbness, weakness or ataxia or problems with walking or coordination,  change in mood/affect or memory.                Objective:   Physical Exam  amb wf nad   Vital signs reviewed  - Note on arrival 02 sats  93% on RA    11/05/2013  184 > 11/25/2013   190 > 12/23/2013  190 > 07/16/2014 174  >172 08/01/2014 > 08/18/2014  171 >  09/29/2014 174 >  03/30/2015 178 > 06/14/2015  178 >  179 06/26/2015 > 09/25/2015 178 > 179 12/11/2015 > 01/26/2016   187 > 03/24/2016   182 >  06/10/2016   171 >  07/05/2016 > 07/27/2016 165 > 09/27/2016  174   HEENT: nl dentition, turbinates, and orophanx. Nl external ear canals without cough reflex   NECK :  without JVD/Nodes/TM/ nl carotid upstrokes bilaterally   LUNGS:     Minimal insp crackles bilaterally on insp  R >  L base s cough on stp   CV:  RRR  no s3 or murmur or increase in P2, 1+ pitting lower ext sym edema   ABD:  soft and nontender with nl excursion in the supine position. No bruits or organomegaly, bowel sounds nl  MS:  warm without deformities, calf tenderness, cyanosis or clubbing  SKIN: warm and dry without lesions       CXR PA and Lateral:   07/27/2016 :    No appreciable change in extensive patchy reticular opacities throughout both lungs consistent with chronic interstitial lung disease. No acute consolidative airspace disease.     EOS      07/27/2016   = 1.2  Absolute

## 2016-10-02 ENCOUNTER — Other Ambulatory Visit: Payer: Self-pay | Admitting: Internal Medicine

## 2016-10-02 DIAGNOSIS — J704 Drug-induced interstitial lung disorders, unspecified: Secondary | ICD-10-CM

## 2016-10-04 NOTE — Assessment & Plan Note (Signed)
See cxr 09/18/13 - rec avoid all macrodantin exp 12/23/2013  - recurrent macrodantin exposure last filled rx  06/2014 [facility] >cxr much worse 07/16/14 with ESR 32 > started short course prednisone  - med calendar 08/01/2014  -CT chest 08/06/14 >Pulmonary parenchymal pattern of fibrosis, as described above, appears progressive when compared with chest radiographs dating back to 07/27/2009. 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 Findings may be post inflammatory in etiology. - 08/29/2014  Walked RA x 3 laps @ 185 ft each stopped due to  End of study mod pace, sats 91% - 09/29/2014  Walked RA x 3 laps @ 185 ft each stopped due to end of study sat 93% and fast pace   - 09/29/2014 PFTs  VC 1.70 (64%) s obst and dlco 57 corrects to 98% - 03/30/2015   PFTs  VC 1.98 (75%) s obst and dlco 66 corrects to 107  - PFT's  01/26/2016   VC 1.71 (65%) s obst   with DLCO  65/63 % corrects to 104  % for alv volume  - Daily pred rx 07/27/2016 for refractory cough /sob (PF vs airways)    The goal with a chronic steroid dependent illness is always arriving at the lowest effective dose that controls the disease/symptoms and not accepting a set "formula" which is based on statistics or guidelines that don't always take into account patient  variability or the natural hx of the dz in every individual patient, which may well vary over time.  For now therefore I recommend the patient maintain a new floor of 5 mg daily and celing of  20 mg   I had an extended discussion with the patient reviewing all relevant studies completed to date and  lasting 15 to 20 minutes of a 25 minute visit    Each maintenance medication was reviewed in detail including most importantly the difference between maintenance and prns and under what circumstances the prns are to be triggered using an action plan format that is not reflected in the computer generated alphabetically  organized AVS but trather by a customized med calendar that reflects the AVS meds with confirmed 100% correlation.   In addition, Please see AVS for unique instructions that I personally wrote and verbalized to the the pt in detail and then reviewed with pt  by my nurse highlighting any  changes in therapy recommended at today's visit to their plan of care.

## 2016-10-04 NOTE — Assessment & Plan Note (Signed)
-  hfa 75% 11/05/13 > rx dulera 100 2bid > changed to symbicort 160 2bid 11/25/13 > improved 09/29/2014 > ok to taper off as of 03/30/15 > flared off - Allergy profile 11/05/2013 >  Eos 6.1%  IgE 363 but RAST only pos to dust - NO 09/25/2015  = 31  - 09/25/2015    try off pm symbicort  - FENO 06/10/2016  =   75 on symb 80 2bid though hfa not optimal  - 07/27/2016  After extensive coaching HFA effectiveness =  75 % (Ti too short)  - Allergy profile 07/27/16  >  Eos 1.2 /  IgE  643 RAST pos dust > cockroach  - Daily pred rx 07/27/2016  -med calendar 08/30/2016  - 09/27/2016  After extensive coaching HFA effectiveness =    75% (Ti still too short)   Despite struggling to master [facility] >> All goals of chronic asthma control met including optimal function and elimination of symptoms with minimal need for rescue therapy.  Contingencies discussed in full including contacting this office immediately if not controlling the symptoms using the rule of two's.     see avs for instructions unique to this ov

## 2016-12-26 ENCOUNTER — Encounter: Payer: Self-pay | Admitting: Internal Medicine

## 2016-12-26 ENCOUNTER — Ambulatory Visit: Payer: Medicare Other | Admitting: Internal Medicine

## 2016-12-26 ENCOUNTER — Ambulatory Visit (INDEPENDENT_AMBULATORY_CARE_PROVIDER_SITE_OTHER): Payer: Medicare Other | Admitting: Internal Medicine

## 2016-12-26 ENCOUNTER — Other Ambulatory Visit: Payer: Self-pay | Admitting: Internal Medicine

## 2016-12-26 VITALS — BP 112/70 | HR 82 | Ht 63.0 in | Wt 176.0 lb

## 2016-12-26 DIAGNOSIS — J704 Drug-induced interstitial lung disorders, unspecified: Secondary | ICD-10-CM

## 2016-12-26 DIAGNOSIS — R0600 Dyspnea, unspecified: Secondary | ICD-10-CM | POA: Diagnosis not present

## 2016-12-26 LAB — PULMONARY FUNCTION TEST
DL/VA % pred: 84 %
DL/VA: 3.94 ml/min/mmHg/L
DLCO COR % PRED: 46 %
DLCO cor: 10.6 ml/min/mmHg
DLCO unc % pred: 46 %
DLCO unc: 10.63 ml/min/mmHg
FEF 25-75 POST: 2.23 L/s
FEF 25-75 Pre: 2.08 L/sec
FEF2575-%Change-Post: 6 %
FEF2575-%Pred-Post: 155 %
FEF2575-%Pred-Pre: 145 %
FEV1-%CHANGE-POST: 6 %
FEV1-%Pred-Post: 71 %
FEV1-%Pred-Pre: 67 %
FEV1-Post: 1.37 L
FEV1-Pre: 1.29 L
FEV1FVC-%Change-Post: 1 %
FEV1FVC-%PRED-PRE: 119 %
FEV6-%Change-Post: 4 %
FEV6-%Pred-Post: 62 %
FEV6-%Pred-Pre: 59 %
FEV6-POST: 1.51 L
FEV6-PRE: 1.44 L
FEV6FVC-%Change-Post: 0 %
FEV6FVC-%PRED-POST: 104 %
FEV6FVC-%Pred-Pre: 104 %
FVC-%CHANGE-POST: 4 %
FVC-%PRED-PRE: 56 %
FVC-%Pred-Post: 59 %
FVC-POST: 1.51 L
FVC-PRE: 1.45 L
PRE FEV6/FVC RATIO: 99 %
Post FEV1/FVC ratio: 90 %
Post FEV6/FVC ratio: 100 %
Pre FEV1/FVC ratio: 89 %
RV % pred: 47 %
RV: 1.1 L
TLC % pred: 56 %
TLC: 2.74 L

## 2016-12-26 MED ORDER — PREDNISONE 10 MG PO TABS: ORAL_TABLET | ORAL | 0 refills | Status: DC

## 2016-12-26 NOTE — Progress Notes (Signed)
Subjective:    Patient ID: Casey Freeman, female    DOB: 1936-02-09    MRN: [id number]   Brief patient profile:  20  yowf never smoker with h/o watery rhinitis/ cough x sev months typically in fall since around 2009 then new onset sob and cough intially dx as bronchitis late 2013  rx zpak and cough syrup and better but  Then newly  started on prn albuterol for wheezing rarely needed until Aug 2014  referred 10/04/2013 by Dr Kenton Kingfisher for progressively worse  Wheeze/ cough  despite adding symbicort.  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    History of Present Illness  10/04/2013 1st [facility] office visit/ Orie Baxendale cc daily sob/ cough x 4-5  m assoc with variable sob to point where has trouble sometimes with adls and even sometimes sob at rest if coughing assoc with hoarseness, dry cough and subwheeze day > night. rec Stop linsinopril  benicar 20/12.5 one daily  Try prilosec 62m  Take 30-60 min before first meal of the day and Pepcid 20 mg one bedtime until cough is completely gone for at least a week without the need for cough suppression Best cough medication is delsym GERD diet  Prednisone 10 mg take  4 each am x 2 days,   2 each am x 2 days,  1 each am x 2 days and stop  Only use your albuterol as needed     07/27/2016  f/u ov/Oluwatoni Rotunno re: persistent cough /sob in pt with PF s/p macrodantin and prob asthma on symb 841 Chief Complaint  Patient presents with  . Follow-up    Cough not improving at all. She is feeling more SOB. She was winded walking just lobby to exam room today.    sob across the room / cough is mostly clear day >> noct  Always better on prednisone but worse as tapers off >>pred 290mthen hold 1014mhen better.     08/30/2016 NP Follow up : Cough/PF-?macrodantin related/Asthma   Allergy profile postiivie to dust, IgE 643, Eosiniophils elevated. ESR 30. FeNO was elevated at 75 . Today FeNO is 36 She is feeling  much better. Decreased cough , dyspnea and wheezing . No recent SABA use. Says she keeps a drippy nose . Says chlor tabs have not been working   Did start on claritin which has helped. Uses chlor tabs at night .   Remains on Symbicort 80.  She has been keeping a log at home for symptoms. Feels the drippy nose causing cough /wheezing symptoms. If she controls this then her cough is better controlled.  We reviewed all her meds and organized them into a med calendar   rec Increase Symbicort 160 2 puffs Twice daily   Can use chlor tabs in additon to claritin to control drippy nose.  Add Flonase 2 puffs daily in am .  Follow med calendar closely and bring to each visit.     09/27/2016  f/u ov/Isra Lindy re: pf/ AB/pnds on maint rx with pred 10 mg daily and symb 160 2bid  Chief Complaint  Patient presents with  . Follow-up    4 weeks, still SOB and has a cough from drainage, she does feel better   [facility] on a flat grade s sob but does fine slow and flat eg ok at HT Has med calendar and following  rec When doing better, reduce the prednisone  to 10 mg one half daily - if doing worse, increase to 2 daily  Work on inhaler technique:  See calendar for specific medication instructions     12/26/2016  f/u ov/Shakima Nisley re:  PF/ AB  Prednisone down to 5 mg daily  Chief Complaint  Patient presents with  . Follow-up    PFT's done today. Breathing is doing well. She has not used albuterol inhaler in the past 3-4 wks.   pnds better since changed clariton to zyrtec  DOE x [facility] = can't walk a nl pace on a flat grade s sob but does fine slow and flat eg shopping fine     No obvious day to day or daytime variability or assoc excess/ purulent sputum or mucus plugs or hemoptysis or cp or chest tightness, subjective wheeze or overt sinus or hb symptoms. No unusual exp hx or h/o childhood pna/ asthma or knowledge of premature birth.  Sleeping ok without nocturnal  or early am exacerbation  of  respiratory  c/o's or need for noct saba. Also denies any obvious fluctuation of symptoms with weather or environmental changes or other aggravating or alleviating factors except as outlined above   Current Medications, Allergies, Complete Past Medical History, Past Surgical History, Family History, and Social History were reviewed in [employer] record.  ROS  The following are not active complaints unless bolded sore throat, dysphagia, dental problems, itching, sneezing,  nasal congestion or excess/ purulent secretions, ear ache,   fever, chills, sweats, unintended wt loss, classically pleuritic or exertional cp,  orthopnea pnd or leg swelling, presyncope, palpitations, abdominal pain, anorexia, nausea, vomiting, diarrhea  or change in bowel or bladder habits, change in stools or urine, dysuria,hematuria,  rash, arthralgias, visual complaints, headache, numbness, weakness or ataxia or problems with walking or coordination,  change in mood/affect or memory.              Objective:   Physical Exam  amb wf nad   Vital signs reviewed   - Note on arrival 02 sats  94% on RA    11/05/2013  184 > 11/25/2013   190 > 12/23/2013  190 > 07/16/2014 174  >172 08/01/2014 > 08/18/2014  171 >  09/29/2014 174 >  03/30/2015 178 > 06/14/2015  178 >  179 06/26/2015 > 09/25/2015 178 > 179 12/11/2015 > 01/26/2016   187 > 03/24/2016   182 >  06/10/2016   171 >  07/05/2016 > 07/27/2016 165 > 09/27/2016  174 > 12/26/2016  176   HEENT: nl dentition, turbinates, and orophanx. Nl external ear canals without cough reflex   NECK :  without JVD/Nodes/TM/ nl carotid upstrokes bilaterally   LUNGS:     Minimal insp crackles bilaterally on insp  R >  L base s cough on insp  CV:  RRR  no s3 or murmur or increase in P2, trace  pitting lower ext sym edema   ABD:  soft and nontender with nl excursion in the supine position. No bruits or organomegaly, bowel sounds nl  MS:  warm without deformities, calf  tenderness, cyanosis or clubbing  SKIN: warm and dry without lesions

## 2016-12-26 NOTE — Patient Instructions (Signed)
No change in medications except ok to use the astelin one twice daily   See calendar for specific medication instructions and bring it back for each and every office visit for every healthcare provider you see.  Without it,  you may not receive the best quality medical care that we feel you deserve.  You will note that the calendar groups together  your maintenance  medications that are timed at particular times of the day.  Think of this as your checklist for what your doctor has instructed you to do until your next evaluation to see what benefit  there is  to staying on a consistent group of medications intended to keep you well.  The other group at the bottom is entirely up to you to use as you see fit  for specific symptoms that may arise between visits that require you to treat them on an as needed basis.  Think of this as your action plan or "what if" list.   Separating the top medications from the bottom group is fundamental to providing you adequate care going forward.    Please schedule a follow up visit in 3 months but call sooner if needed

## 2016-12-26 NOTE — Progress Notes (Signed)
PFT done today. 

## 2016-12-27 NOTE — Assessment & Plan Note (Addendum)
See cxr 09/18/13 - rec avoid all macrodantin exp 12/23/2013  - recurrent macrodantin exposure last filled rx  [facility] >cxr much worse 07/16/14 with ESR 32 > started short course prednisone  - med calendar 08/01/2014  -CT chest 08/06/14 >Pulmonary parenchymal pattern of fibrosis, as described above, appears progressive when compared with chest radiographs dating back to 07/27/2009. 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  - 08/29/2014  Walked RA x 3 laps @ 185 ft each stopped due to  End of study mod pace, sats 91% - 09/29/2014  Walked RA x 3 laps @ 185 ft each stopped due to end of study sat 93% and fast pace   - 09/29/2014 PFTs  VC 1.70 (64%) s obst and dlco 57 corrects to 98% - 03/30/2015   PFTs  VC 1.98 (75%) s obst and dlco 66 corrects to 107  - PFT's  01/26/2016   VC 1.71 (65%) s obst   with DLCO  65/63 % corrects to 104  % for alv volume  - Daily pred rx 07/27/2016 for refractory cough /sob (PF vs airways)   - PFT's  12/26/2016   VC 1.65 (64%)  and no obst p  symb 160 prior to study with DLCO  46 % corrects to 84 % for alv volume  On pred 5 mg daily  - 12/26/2016  Walked RA x 3 laps @ 185 ft each stopped due to  End of study, nl pace, no sob or desat     Continues to show evidence of active / steroid dep ILD despite remote macrodantin exposure but no other obvious cause ? Underlying connective tissue dz  The goal with a chronic steroid dependent illness is always arriving at the lowest effective dose that controls the disease/symptoms and not accepting a set "formula" which is based on statistics or guidelines that don't always take into account patient  variability or the natural hx of the dz in every individual patient, which may well vary over time.  For now therefore I recommend the patient maintain  A ceiling of 20 mg daily and a floor of 5 mg daily   I had an extended discussion with the patient reviewing all  relevant studies completed to date and  lasting 15 to 20 minutes of a 25 minute visit    Each maintenance medication was reviewed in detail including most importantly the difference between maintenance and prns and under what circumstances the prns are to be triggered using an action plan format that is not reflected in the computer generated alphabetically organized AVS but trather by a customized med calendar that reflects the AVS meds with confirmed 100% correlation.   In addition, Please see AVS for unique instructions that I personally wrote and verbalized to the the pt in detail and then reviewed with pt  by my nurse highlighting any  changes in therapy recommended at today's visit to their plan of care.

## 2017-03-28 ENCOUNTER — Ambulatory Visit (INDEPENDENT_AMBULATORY_CARE_PROVIDER_SITE_OTHER): Payer: Medicare Other | Admitting: Internal Medicine

## 2017-03-28 ENCOUNTER — Encounter: Payer: Self-pay | Admitting: Internal Medicine

## 2017-03-28 VITALS — BP 112/78 | HR 92 | Ht 63.0 in | Wt 177.2 lb

## 2017-03-28 DIAGNOSIS — R05 Cough: Secondary | ICD-10-CM

## 2017-03-28 DIAGNOSIS — R059 Cough, unspecified: Secondary | ICD-10-CM

## 2017-03-28 DIAGNOSIS — J45991 Cough variant asthma: Secondary | ICD-10-CM | POA: Diagnosis not present

## 2017-03-28 DIAGNOSIS — J8410 Pulmonary fibrosis, unspecified: Secondary | ICD-10-CM | POA: Diagnosis not present

## 2017-03-28 DIAGNOSIS — J704 Drug-induced interstitial lung disorders, unspecified: Secondary | ICD-10-CM

## 2017-03-28 MED ORDER — PREDNISONE 10 MG PO TABS: ORAL_TABLET | ORAL | 1 refills | Status: DC

## 2017-03-28 NOTE — Progress Notes (Signed)
Subjective:    Patient ID: Casey Freeman, female    DOB: 01-12-1936    MRN: [id number]   Brief patient profile:  73  yowf never smoker with h/o watery rhinitis/ cough x sev months typically in fall since around 2009 then new onset sob and cough intially dx as bronchitis late 2013  rx zpak and cough syrup and better but  Then newly  started on prn albuterol for wheezing rarely needed until Aug 2014  referred 10/04/2013 by Dr Kenton Kingfisher for progressively worse  Wheeze/ cough  despite adding symbicort.  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    History of Present Illness  10/04/2013 1st [facility] office visit/ Casey Freeman cc daily sob/ cough x 4-5  m assoc with variable sob to point where has trouble sometimes with adls and even sometimes sob at rest if coughing assoc with hoarseness, dry cough and subwheeze day > night. rec Stop linsinopril  benicar 20/12.5 one daily  Try prilosec 3m  Take 30-60 min before first meal of the day and Pepcid 20 mg one bedtime until cough is completely gone for at least a week without the need for cough suppression Best cough medication is delsym GERD diet  Prednisone 10 mg take  4 each am x 2 days,   2 each am x 2 days,  1 each am x 2 days and stop  Only use your albuterol as needed     07/27/2016  f/u ov/Casey Freeman re: persistent cough /sob in pt with PF s/p macrodantin and prob asthma on symb 830 Chief Complaint  Patient presents with  . Follow-up    Cough not improving at all. She is feeling more SOB. She was winded walking just lobby to exam room today.    sob across the room / cough is mostly clear day >> noct  Always better on prednisone but worse as tapers off >>pred 263mthen hold 1064mhen better.     08/30/2016 NP Follow up : Cough/PF-?macrodantin related/Asthma   Allergy profile postiivie to dust, IgE 643, Eosiniophils elevated. ESR 30. FeNO was elevated at 75 . Today FeNO is 36 She is feeling  much better. Decreased cough , dyspnea and wheezing . No recent SABA use. Says she keeps a drippy nose . Says chlor tabs have not been working   Did start on claritin which has helped. Uses chlor tabs at night .   Remains on Symbicort 80.  She has been keeping a log at home for symptoms. Feels the drippy nose causing cough /wheezing symptoms. If she controls this then her cough is better controlled.  We reviewed all her meds and organized them into a med calendar   rec Increase Symbicort 160 2 puffs Twice daily   Can use chlor tabs in additon to claritin to control drippy nose.  Add Flonase 2 puffs daily in am .  Follow med calendar closely and bring to each visit.     09/27/2016  f/u ov/Casey Freeman re: pf/ AB/pnds on maint rx with pred 10 mg daily and symb 160 2bid  Chief Complaint  Patient presents with  . Follow-up    4 weeks, still SOB and has a cough from drainage, she does feel better   [facility]an't walk a nl pace on a flat grade s sob but does fine slow and flat eg ok at HT Has med calendar and following  rec When doing better, reduce the prednisone  to 10 mg one half daily - if doing worse, increase to 2 daily  Work on inhaler technique:  See calendar for specific medication instructions     12/26/2016  f/u ov/Casey Freeman re:  PF/ AB  Prednisone down to 5 mg daily  Chief Complaint  Patient presents with  . Follow-up    PFT's done today. Breathing is doing well. She has not used albuterol inhaler in the past 3-4 wks.   pnds better since changed clariton to zyrtec  DOE x [facility] = can't walk a nl pace on a flat grade s sob but does fine slow and flat eg shopping fine  rec No change rx    03/28/2017  f/u ov/Casey Freeman re: PF / AB prednisone  10 mg daily  Chief Complaint  Patient presents with  . Follow-up    Pt states her breathing is unchanged, Pt states the heat is affecting her breathing, she has noticed she has needed her pro air more, she has been having increase sob with exertion,  coughing with some thick mucus, She has a hard time producing mucus, she feels like it is stuck in her chest, Denies chest tightness,fever   much worse when stopped pred x 1 week  Better after 20 and then tapered to 10 mg daily for the last month prior to OV   Sleeps ok  Walks up to an hour tiw shopping s stopping albeit slow pace s 02 (though note walked very fast pace in office)   No obvious day to day or daytime variability or assoc excess/ purulent sputum or mucus plugs or hemoptysis or cp or chest tightness, subjective wheeze or overt sinus or hb symptoms. No unusual exp hx or h/o childhood pna/ asthma or knowledge of premature birth.  Sleeping ok without nocturnal  or early am exacerbation  of respiratory  c/o's or need for noct saba. Also denies any obvious fluctuation of symptoms with weather or environmental changes or other aggravating or alleviating factors except as outlined above   Current Medications, Allergies, Complete Past Medical History, Past Surgical History, Family History, and Social History were reviewed in [employer] record.  ROS  The following are not active complaints unless bolded sore throat, dysphagia, dental problems, itching, sneezing,  nasal congestion or excess/ purulent secretions, ear ache,   fever, chills, sweats, unintended wt loss, classically pleuritic or exertional cp,  orthopnea pnd or leg swelling, presyncope, palpitations, abdominal pain, anorexia, nausea, vomiting, diarrhea  or change in bowel or bladder habits, change in stools or urine, dysuria,hematuria,  rash, arthralgias, visual complaints, headache, numbness, weakness or ataxia or problems with walking or coordination,  change in mood/affect or memory.                      Objective:   Physical Exam  amb wf nad   Vital signs reviewed   - Note on arrival 02 sats  97% on RA    11/05/2013  184 > 11/25/2013   190 > 12/23/2013  190 > 07/16/2014 174  >172 08/01/2014 > 08/18/2014   171 >  09/29/2014 174 >  03/30/2015 178 > 06/14/2015  178 >  179 06/26/2015 > 09/25/2015 178 > 179 12/11/2015 > 01/26/2016   187 > 03/24/2016   182 >  06/10/2016   171 >  07/05/2016 > 07/27/2016 165 > 09/27/2016  174 > 12/26/2016  176 > 03/28/2017  177   HEENT: nl dentition, turbinates, and orophanx. Nl external ear  canals without cough reflex   NECK :  without JVD/Nodes/TM/ nl carotid upstrokes bilaterally   LUNGS:     Pops and squeaks on insp bilaterally   CV:  RRR  no s3 or murmur or increase in P2, trace  pitting lower ext sym edema wearing tight hose    ABD:  soft and nontender with nl excursion in the supine position. No bruits or organomegaly, bowel sounds nl  MS:  warm without deformities, calf tenderness, cyanosis or clubbing  SKIN: warm and dry without lesions

## 2017-03-28 NOTE — Patient Instructions (Signed)
For cough try mucinex dm up to 1200 mg as needed  See calendar for specific medication instructions and bring it back for each and every office visit for every healthcare provider you see.  Without it,  you may not receive the best quality medical care that we feel you deserve.  You will note that the calendar groups together  your maintenance  medications that are timed at particular times of the day.  Think of this as your checklist for what your doctor has instructed you to do until your next evaluation to see what benefit  there is  to staying on a consistent group of medications intended to keep you well.  The other group at the bottom is entirely up to you to use as you see fit  for specific symptoms that may arise between visits that require you to treat them on an as needed basis.  Think of this as your action plan or "what if" list.   Separating the top medications from the bottom group is fundamental to providing you adequate care going forward.    Please schedule a follow up visit in 3 months but call sooner if needed

## 2017-03-29 NOTE — Assessment & Plan Note (Signed)
Body mass index is 31.39 kg/m.  -  trending gradually up on systemic steroids  No results found for: TSH   Contributing to gerd risk/ doe/reviewed the need and the process to achieve and maintain neg calorie balance > defer f/u primary care including intermittently monitoring thyroid status

## 2017-03-29 NOTE — Assessment & Plan Note (Signed)
See cxr 09/18/13 - rec avoid all macrodantin exp 12/23/2013  - recurrent macrodantin exposure last filled rx  [facility] >cxr much worse 07/16/14 with ESR 32 > started short course prednisone  - med calendar 08/01/2014  -CT chest 08/06/14 >Pulmonary parenchymal pattern of fibrosis, as described above, appears progressive when compared with chest radiographs dating back to 07/27/2009. 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  - 08/29/2014  Walked RA x 3 laps @ 185 ft each stopped due to  End of study mod pace, sats 91% - 09/29/2014  Walked RA x 3 laps @ 185 ft each stopped due to end of study sat 93% and fast pace   - 09/29/2014 PFTs  VC 1.70 (64%) s obst and dlco 57 corrects to 98% - 03/30/2015   PFTs  VC 1.98 (75%) s obst and dlco 66 corrects to 107  - PFT's  01/26/2016   VC 1.71 (65%) s obst   with DLCO  65/63 % corrects to 104  % for alv volume  - Daily pred rx 07/27/2016 for refractory cough /sob (PF vs airways)   - PFT's  12/26/2016   VC 1.65 (64%)  and no obst p  symb 160 prior to study with DLCO  46 % corrects to 84 % for alv volume  On pred 5 mg daily  - 12/26/2016  Walked RA x 3 laps @ 185 ft each stopped due to  End of study, nl pace, no sob or desat   - 03/28/2017   Walked RA  2 laps @ 185 ft each stopped due to  Sob with desat to 85% at fast pace on pred 10 mg per day p flaring off it     Clearly she has a steroid responsive illness and should not have stopped her prednisone completely without checking with me. The challenge here is finding the lowest dose that works and clearly that is not 0.  For now therefore I recommended she continue at a ceiling of 20 mg per day and a floor 5 mg per day.  I had an extended discussion with the patient reviewing all relevant studies completed to date and  lasting 15 to 20 minutes of a 25 minute visit    Each maintenance medication was reviewed in detail including most  importantly the difference between maintenance and prns and under what circumstances the prns are to be triggered using an action plan format that is not reflected in the computer generated alphabetically organized AVS but trather by a customized med calendar that reflects the AVS meds with confirmed 100% correlation.   In addition, Please see AVS for unique instructions that I personally wrote and verbalized to the the pt in detail and then reviewed with pt  by my nurse highlighting any  changes in therapy recommended at today's visit to their plan of care.

## 2017-03-29 NOTE — Assessment & Plan Note (Signed)
-   ACEi d/c 10/04/2013  - Sinus CT 10/18/2013 > Clear paranasal sinuses - Allergy profile 11/05/2013 >  Eos 6.1%  IgE 363 but RAST only pos to dust - ENT eval 04/14/16 [place] c/w scarring from prev surgery only  - Allergy profile 07/27/16  >  Eos 1.2 /  IgE  643 RAST pos dust > cockroach   Adequate control on present rx, reviewed in detail with pt > no change in rx needed

## 2017-05-17 ENCOUNTER — Encounter: Payer: Self-pay | Admitting: Internal Medicine

## 2017-05-17 ENCOUNTER — Ambulatory Visit (INDEPENDENT_AMBULATORY_CARE_PROVIDER_SITE_OTHER): Payer: Medicare Other | Admitting: Internal Medicine

## 2017-05-17 ENCOUNTER — Ambulatory Visit (INDEPENDENT_AMBULATORY_CARE_PROVIDER_SITE_OTHER)
Admission: RE | Admit: 2017-05-17 | Discharge: 2017-05-17 | Disposition: A | Discharge status: Home / Self Care | Payer: Medicare Other | Source: Ambulatory Visit | Attending: Internal Medicine | Admitting: Internal Medicine

## 2017-05-17 VITALS — BP 124/70 | HR 106 | Temp 98.4°F | Ht 62.0 in | Wt 178.0 lb

## 2017-05-17 DIAGNOSIS — J704 Drug-induced interstitial lung disorders, unspecified: Secondary | ICD-10-CM | POA: Diagnosis not present

## 2017-05-17 DIAGNOSIS — J45991 Cough variant asthma: Secondary | ICD-10-CM | POA: Diagnosis not present

## 2017-05-17 NOTE — Patient Instructions (Addendum)
Walk at slower pace  Work on inhaler technique:  relax and gently blow all the way out then take a nice smooth deep breath back in, triggering the inhaler at same time you start breathing in.  Hold for up to 5 seconds if you can. Blow out thru nose. Rinse and gargle with water when done     Stay on 20 mg prednisone / day until better then try to taper to 5 mg  Daily   Please remember to go to the  x-ray department downstairs in the basement  for your tests - we will call you with the results when they are available.     Keep appt in September but call sooner if needed  Add schedule hrct for return

## 2017-05-17 NOTE — Progress Notes (Signed)
Subjective:    Patient ID: Casey Freeman, female    DOB: Oct 23, 1935    MRN: [id number]   Brief patient profile:  63  yowf never smoker with h/o watery rhinitis/ cough x sev months typically in fall since around 2009 then new onset sob and cough intially dx as bronchitis late 2013  rx zpak and cough syrup and better but  Then newly  started on prn albuterol for wheezing rarely needed until Aug 2014  referred 10/04/2013 by Dr Tiburcio Pea for progressively worse  Wheeze/ cough  despite adding symbicort.  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    History of Present Illness  10/04/2013 1st [facility] office visit/ Hazeline Charnley cc daily sob/ cough x 4-5  m assoc with variable sob to point where has trouble sometimes with adls and even sometimes sob at rest if coughing assoc with hoarseness, dry cough and subwheeze day > night. rec Stop linsinopril  benicar 20/12.5 one daily  Try prilosec   Take 30-60 min before first meal of the day and Pepcid 20 mg one bedtime until cough is completely gone for at least a week without the need for cough suppression Best cough medication is delsym GERD diet  Prednisone 10 mg take  4 each am x 2 days,   2 each am x 2 days,  1 each am x 2 days and stop  Only use your albuterol as needed     07/27/2016  f/u ov/Serra Younan re: persistent cough /sob in pt with PF s/p macrodantin and prob asthma on symb 42  Chief Complaint  Patient presents with  . Follow-up    Cough not improving at all. She is feeling more SOB. She was winded walking just lobby to exam room today.    sob across the room / cough is mostly clear day >> noct  Always better on prednisone but worse as tapers off >>pred  then hold  when better.      09/27/2016  f/u ov/Mong Neal re: pf/ AB/pnds on maint rx with pred 10 mg daily and symb 160 2bid  Chief Complaint  Patient presents with  . Follow-up    4 weeks, still SOB and has a cough from drainage,  she does feel better   [facility] = can't walk a nl pace on a flat grade s sob but does fine slow and flat eg ok at [facility] Has med calendar and following  rec When doing better, reduce the prednisone to 10 mg one half daily - if doing worse, increase to 2 daily  Work on inhaler technique:  See calendar for specific medication instructions       03/28/2017  f/u ov/Devon Kingdon re: PF / AB prednisone  10 mg daily  Chief Complaint  Patient presents with  . Follow-up    Pt states her breathing is unchanged, Pt states the heat is affecting her breathing, she has noticed she has needed her pro air more, she has been having increase sob with exertion, coughing with some thick mucus, She has a hard time producing mucus, she feels like it is stuck in her chest, Denies chest tightness,fever   much worse when stopped pred x 1 week  Better after 20 and then tapered to 10 mg daily for the last month prior to OV   Sleeps ok  Walks up to an hour tiw shopping s stopping albeit slow pace s 02 (though note walked very fast pace in office)  rec For cough try mucinex dm up to 1200 mg as needed See calendar for specific medication instructions and bring it back for each and every office visit for every healthcare provider you see.   Separating the top medications from the bottom group is fundamental to providing you adequate care going forward.    05/17/2017  f/u ov/Pierson Vantol re:  PF /cough variant asthma  Chief Complaint  Patient presents with  . Acute Visit    pt reports of increased sob and chest tightness with exertion, occ prod cough with clear mucus & occ wheezing.  increased pred to 20 mg daily from 10 mg daily baseline x 2 days prior to OV  And "no better"  proair helps breathing some but only using  Few times a day at most, never noct , and only if over does it  Legs weak several times a week stays that way all day and thinks it correlates with finger sats but they were proven inaccurate today  Cough no worse than  usual     No obvious day to day or daytime variability or assoc excess/ purulent sputum or mucus plugs or hemoptysis or cp or chest tightness, subjective wheeze or overt sinus or hb symptoms. No unusual exp hx or h/o childhood pna/ asthma or knowledge of premature birth.  Sleeping ok without nocturnal  or early am exacerbation  of respiratory  c/o's or need for noct saba. Also denies any obvious fluctuation of symptoms with weather or environmental changes or other aggravating or alleviating factors except as outlined above   Current Medications, Allergies, Complete Past Medical History, Past Surgical History, Family History, and Social History were reviewed in [facility] electronic medical record.  ROS  The following are not active complaints unless bolded sore throat, dysphagia, dental problems, itching, sneezing,  nasal congestion or excess/ purulent secretions, ear ache,   fever, chills, sweats, unintended wt loss, classically pleuritic or exertional cp,  orthopnea pnd or leg swelling, presyncope, palpitations, abdominal pain, anorexia, nausea, vomiting, diarrhea  or change in bowel or bladder habits, change in stools or urine, dysuria,hematuria,  rash, arthralgias, visual complaints, headache, numbness, weakness or ataxia or problems with walking or coordination,  change in mood/affect or memory.                                    Objective:   Physical Exam  amb wf nad   Vital signs reviewed      11/05/2013  184 > 11/25/2013   190 > 12/23/2013  190 > 07/16/2014 174  >172 08/01/2014 > 08/18/2014  171 >  09/29/2014 174 >  03/30/2015 178 > 06/14/2015  178 >  179 06/26/2015 > 09/25/2015 178 > 179 12/11/2015 > 01/26/2016   187 > 03/24/2016   182 >  06/10/2016   171 >  07/05/2016 > 07/27/2016 165 > 09/27/2016  174 > 12/26/2016  176 > 03/28/2017  177 > 05/17/2017   178   HEENT: nl dentition, turbinates, and oropharynx. Nl external ear canals without cough reflex   NECK :  without JVD/Nodes/TM/  nl carotid upstrokes bilaterally   LUNGS:     Pops and squeaks on insp bilaterally and a few exp rhonchi as well   CV:  RRR  no s3 or murmur or increase in P2, trace  pitting lower ext sym edema wearing tight hose    ABD:  soft and nontender with  nl excursion in the supine position. No bruits or organomegaly, bowel sounds nl  MS:  warm without deformities, calf tenderness, cyanosis or clubbing  SKIN: warm and dry without lesions         CXR PA and Lateral:   05/17/2017 :    I personally reviewed images and agree with radiology impression as follows:    Slight interval worsening in the appearance of the pulmonary interstitium with increased fibrotic changes. There is no alveolar pneumonia nor CHF.

## 2017-05-18 NOTE — Assessment & Plan Note (Signed)
-   hfa 75% 11/05/13 > rx dulera 100 2bid > changed to symbicort 160 2bid 11/25/13 > improved 09/29/2014 > ok to taper off as of 03/30/15 > flared off - Allergy profile 11/05/2013 >  Eos 6.1%  IgE 363 but RAST only pos to dust - NO 09/25/2015  = 31  - 09/25/2015    try off pm symbicort  - FENO 06/10/2016  =   75 on symb 80 2bid though hfa not optimal  - 07/27/2016  After extensive coaching HFA effectiveness =  75 % (Ti too short)  - Allergy profile 07/27/16  >  Eos 1.2 /  IgE  643 RAST pos dust > cockroach  - Daily pred rx 07/27/2016  -med calendar 08/30/2016   - 05/17/2017  After extensive coaching HFA effectiveness =    75% (Ti still too short) > continue symbicort 160 2bid in hopes of reducing systemic steroid dependency

## 2017-05-18 NOTE — Assessment & Plan Note (Signed)
See cxr 09/18/13 - rec avoid all macrodantin exp 12/23/2013  - recurrent macrodantin exposure last filled rx  06/2014 [facility] >cxr much worse 07/16/14 with ESR 32 > started short course prednisone  - med calendar 08/01/2014  -CT chest 08/06/14 >Pulmonary parenchymal pattern of fibrosis, as described above, appears progressive when compared with chest radiographs dating back to 07/27/2009. 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  - 08/29/2014  Walked RA x 3 laps @ 185 ft each stopped due to  End of study mod pace, sats 91% - 09/29/2014  Walked RA x 3 laps @ 185 ft each stopped due to end of study sat 93% and fast pace   - 09/29/2014 PFTs  VC 1.70 (64%) s obst and dlco 57 corrects to 98% - 03/30/2015   PFTs  VC 1.98 (75%) s obst and dlco 66 corrects to 107  - PFT's  01/26/2016   VC 1.71 (65%) s obst   with DLCO  65/63 % corrects to 104  % for alv volume  - Daily pred rx 07/27/2016 for refractory cough /sob (PF vs airways)   - PFT's  12/26/2016   VC 1.65 (64%)  and no obst p  symb 160 prior to study with DLCO  46 % corrects to 84 % for alv volume  On pred 5 mg daily  - 12/26/2016  Walked RA x 3 laps @ 185 ft each stopped due to  End of study, nl pace, no sob or desat  - 03/28/2017   Walked RA  2 laps @ 185 ft each stopped due to  Sob with desat to 85% at fast pace on pred 10 mg per day p flaring off it    - 05/17/2017   Walked RA  2 laps @ 185 ft each stopped due to  Sob/ sats 93% at end    She continues to attempt to walk at a pace this is not appropriate for her degree of PF, advised 02 would not be helpful based on today's walking sats  The goal with a chronic steroid dependent illness is always arriving at the lowest effective dose that controls the disease/symptoms and not accepting a set "formula" which is based on statistics or guidelines that don't always take into account patient  variability or the natural hx of the dz in  every individual patient, which may well vary over time.  For now therefore I recommend the patient maintain  20 mg ceiling and 5 mg floor  Repeat HRCT should be done to be sure we're not dealing with some other form of PF at this point    I had an extended discussion with the patient reviewing all relevant studies completed to date and  lasting 15 to 20 minutes of a 25 minute acute office visit    Each maintenance medication was reviewed in detail including most importantly the difference between maintenance and prns and under what circumstances the prns are to be triggered using an action plan format that is not reflected in the computer generated alphabetically organized AVS.    Please see AVS for specific instructions unique to this visit that I personally wrote and verbalized to the the pt in detail and then reviewed with pt  by my nurse highlighting any  changes in therapy recommended at today's visit to their plan of care.

## 2017-05-22 ENCOUNTER — Telehealth: Payer: Self-pay | Admitting: *Deleted

## 2017-05-22 DIAGNOSIS — J704 Drug-induced interstitial lung disorders, unspecified: Secondary | ICD-10-CM

## 2017-05-22 NOTE — Telephone Encounter (Signed)
-----   Message from Nyoka Cowden, MD sent at 05/18/2017  6:27 AM EDT ----- Schedule hrct at her convenience the week before her next ov

## 2017-05-31 ENCOUNTER — Other Ambulatory Visit: Payer: Self-pay | Admitting: Family Medicine

## 2017-05-31 DIAGNOSIS — Z1231 Encounter for screening mammogram for malignant neoplasm of breast: Secondary | ICD-10-CM

## 2017-06-22 ENCOUNTER — Ambulatory Visit (INDEPENDENT_AMBULATORY_CARE_PROVIDER_SITE_OTHER)
Admission: RE | Admit: 2017-06-22 | Discharge: 2017-06-22 | Disposition: A | Discharge status: Home / Self Care | Payer: Medicare Other | Source: Ambulatory Visit | Attending: Internal Medicine | Admitting: Internal Medicine

## 2017-06-22 DIAGNOSIS — J704 Drug-induced interstitial lung disorders, unspecified: Secondary | ICD-10-CM | POA: Diagnosis not present

## 2017-06-28 ENCOUNTER — Ambulatory Visit (INDEPENDENT_AMBULATORY_CARE_PROVIDER_SITE_OTHER): Payer: Medicare Other | Admitting: Internal Medicine

## 2017-06-28 ENCOUNTER — Encounter: Payer: Self-pay | Admitting: Internal Medicine

## 2017-06-28 ENCOUNTER — Telehealth: Payer: Self-pay | Admitting: Internal Medicine

## 2017-06-28 ENCOUNTER — Other Ambulatory Visit (INDEPENDENT_AMBULATORY_CARE_PROVIDER_SITE_OTHER): Payer: Medicare Other

## 2017-06-28 VITALS — BP 130/80 | HR 101 | Ht 62.0 in | Wt 173.0 lb

## 2017-06-28 DIAGNOSIS — J704 Drug-induced interstitial lung disorders, unspecified: Secondary | ICD-10-CM

## 2017-06-28 DIAGNOSIS — J9611 Chronic respiratory failure with hypoxia: Secondary | ICD-10-CM

## 2017-06-28 LAB — SEDIMENTATION RATE: Sed Rate: 25 mm/hr (ref 0–30)

## 2017-06-28 NOTE — Progress Notes (Signed)
Subjective:    Patient ID: Casey Freeman, female    DOB: Oct 23, 1935    MRN: [id number]   Brief patient profile:  63  yowf never smoker with h/o watery rhinitis/ cough x sev months typically in fall since around 2009 then new onset sob and cough intially dx as bronchitis late 2013  rx zpak and cough syrup and better but  Then newly  started on prn albuterol for wheezing rarely needed until Aug 2014  referred 10/04/2013 by Dr Tiburcio Pea for progressively worse  Wheeze/ cough  despite adding symbicort.  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    History of Present Illness  10/04/2013 1st [facility] office visit/ Casey Freeman cc daily sob/ cough x 4-5  m assoc with variable sob to point where has trouble sometimes with adls and even sometimes sob at rest if coughing assoc with hoarseness, dry cough and subwheeze day > night. rec Stop linsinopril  benicar 20/12.5 one daily  Try prilosec   Take 30-60 min before first meal of the day and Pepcid 20 mg one bedtime until cough is completely gone for at least a week without the need for cough suppression Best cough medication is delsym GERD diet  Prednisone 10 mg take  4 each am x 2 days,   2 each am x 2 days,  1 each am x 2 days and stop  Only use your albuterol as needed     07/27/2016  f/u ov/Casey Freeman re: persistent cough /sob in pt with PF s/p macrodantin and prob asthma on symb 42  Chief Complaint  Patient presents with  . Follow-up    Cough not improving at all. She is feeling more SOB. She was winded walking just lobby to exam room today.    sob across the room / cough is mostly clear day >> noct  Always better on prednisone but worse as tapers off >>pred  then hold  when better.      09/27/2016  f/u ov/Casey Freeman re: pf/ AB/pnds on maint rx with pred 10 mg daily and symb 160 2bid  Chief Complaint  Patient presents with  . Follow-up    4 weeks, still SOB and has a cough from drainage,  she does feel better   [facility] = can't walk a nl pace on a flat grade s sob but does fine slow and flat eg ok at [facility] Has med calendar and following  rec When doing better, reduce the prednisone to 10 mg one half daily - if doing worse, increase to 2 daily  Work on inhaler technique:  See calendar for specific medication instructions       03/28/2017  f/u ov/Casey Freeman re: PF / AB prednisone  10 mg daily  Chief Complaint  Patient presents with  . Follow-up    Pt states her breathing is unchanged, Pt states the heat is affecting her breathing, she has noticed she has needed her pro air more, she has been having increase sob with exertion, coughing with some thick mucus, She has a hard time producing mucus, she feels like it is stuck in her chest, Denies chest tightness,fever   much worse when stopped pred x 1 week  Better after 20 and then tapered to 10 mg daily for the last month prior to OV   Sleeps ok  Walks up to an hour tiw shopping s stopping albeit slow pace s 02 (though note walked very fast pace in office)  rec For cough try mucinex dm up to 1200 mg as needed See calendar for specific medication instructions and bring it back for each and every office visit for every healthcare provider you see.   Separating the top medications from the bottom group is fundamental to providing you adequate care going forward.    05/17/2017  f/u ov/Casey Freeman re:  PF /cough variant asthma  Chief Complaint  Patient presents with  . Acute Visit    pt reports of increased sob and chest tightness with exertion, occ prod cough with clear mucus & occ wheezing.  increased pred to 20 mg daily from 10 mg daily baseline x 2 days prior to OV  And "no better"  proair helps breathing some but only using  Few times a day at most, never noct , and only if over does it  Legs weak several times a week stays that way all day and thinks it correlates with finger sats but they were proven inaccurate today  Cough no worse than  usual  rec Walk at slower pace Work on inhaler technique: Stay on 20 mg prednisone / day until better then try to taper to 5 mg  Daily  Please remember to go to the  x-ray department downstairs in the basement  for your tests - we will call you with the results when they are available. Keep appt in September but call sooner if needed  Add schedule hrct for return > ? HSP   06/28/2017  f/u ov/Casey Freeman re: weaned pred to 5 mg per day  Chief Complaint  Patient presents with  . Follow-up    Breathing is about the same "worse at times"- having trouble with minimal exertion such as folding clothes. She states that she is pleased with how well her cough is doing. She is using proair 2 x daily on average.   proair seems to help despite using symb 160 bid as maint but hfa not optimal  New oximeter x 3 weeks dropping with activity on 5 m pred per day  No pet/ bird/hay exposure   No obvious day to day or daytime variability or assoc excess/ purulent sputum or mucus plugs or hemoptysis or cp or chest tightness, subjective wheeze or overt sinus or hb symptoms. No unusual exp hx or h/o childhood pna/ asthma or knowledge of premature birth.  Sleeping ok  p chlortrimeton  without nocturnal  or early am exacerbation  of respiratory  c/o's or need for noct saba. Also denies any obvious fluctuation of symptoms with weather or environmental changes or other aggravating or alleviating factors except as outlined above   Current Allergies, Complete Past Medical History, Past Surgical History, Family History, and Social History were reviewed in [employer] record.  ROS  The following are not active complaints unless bolded sore throat, dysphagia, dental problems, itching, sneezing,  nasal congestion or disharge of excess mucus or purulent secretions, ear ache,   fever, chills, sweats, unintended wt loss or wt gain, classically pleuritic or exertional cp,  orthopnea pnd or leg swelling,  presyncope, palpitations, abdominal pain, anorexia, nausea, vomiting, diarrhea  or change in bowel habits or bladder habits, change in stools or change in urine, dysuria, hematuria,  rash, arthralgias, visual complaints, headache, numbness, weakness or ataxia or problems with walking or coordination,  change in mood/affect or memory.        Current Meds  Medication Sig  . acetaminophen (TYLENOL) 500 MG tablet Take per bottle as needed for  pain  . albuterol (PROAIR HFA) 108 (90 Base) MCG/ACT inhaler Inhale 2 puffs into the lungs every 4 (four) hours as needed for wheezing or shortness of breath.  . Ascorbic Acid (VITAMIN C PO) Take 1 tablet by mouth daily.  Marland Kitchen aspirin 81 MG tablet Take 81 mg by mouth 2 (two) times a week. On Tuesdays and Thursdays  . Azelastine HCl 0.15 % SOLN Place 1 spray into both nostrils at bedtime.  . budesonide-formoterol (SYMBICORT) 160-4.5 MCG/ACT inhaler Inhale 2 puffs into the lungs 2 (two) times daily.  . cetirizine (ZYRTEC) 10 MG tablet Take 10 mg by mouth daily.  . chlorpheniramine (CHLOR-TRIMETON) 4 MG tablet Take 1 tablet by mouth every 4 hours as needed for drippy nose, drainage, and throat clearing  . Cholecalciferol (VITAMIN D) 2000 UNITS CAPS Take 1 capsule by mouth daily.  Marland Kitchen dextromethorphan (DELSYM) 30 MG/5ML liquid 2 tsp every 12 hours as needed for cough  . famotidine (PEPCID) 20 MG tablet Take 20 mg by mouth at bedtime.   . hydrochlorothiazide (HYDRODIURIL) 25 MG tablet Take 25 mg by mouth daily as needed (leg swelling).   . LORazepam (ATIVAN) 0.5 MG tablet Take 0.5 mg by mouth 2 (two) times daily.   . Magnesium 250 MG TABS Take 1 tablet by mouth daily. Reported on 12/11/2015  . pantoprazole (PROTONIX) 40 MG tablet Take 1 tablet (40 mg total) by mouth daily before breakfast.  . predniSONE (DELTASONE) 10 MG tablet TAKE 2 TABLETS BY MOUTH EVERY MORNING UNTIL BETTER THEN 1 TABLET EVERY DAY  . telmisartan (MICARDIS) 40 MG tablet Take 40 mg by mouth daily.  .  traMADol (ULTRAM) 50 MG tablet Take 50-100 mg by mouth every 4 (four) hours as needed (if still coughing).   . TRAVATAN Z 0.004 % SOLN ophthalmic solution Apply 1 drop to eye at bedtime.                                        Objective:   Physical Exam  amb wf nad   Vital signs reviewed    - Note on arrival 02 sats  95% on RA    11/05/2013  184 > 11/25/2013   190 > 12/23/2013  190 > 07/16/2014 174  >172 08/01/2014 > 08/18/2014  171 >  09/29/2014 174 >  03/30/2015 178 > 06/14/2015  178 >  179 06/26/2015 > 09/25/2015 178 > 179 12/11/2015 > 01/26/2016   187 > 03/24/2016   182 >  06/10/2016   171 >  07/05/2016 > 07/27/2016 165 > 09/27/2016  174 > 12/26/2016  176 > 03/28/2017  177 > 05/17/2017   178 > 06/28/2017  173   HEENT: nl dentition, turbinates, and oropharynx. Nl external ear canals without cough reflex   NECK :  without JVD/Nodes/TM/ nl carotid upstrokes bilaterally   LUNGS:     Pops and squeaks on insp bilaterally / min  exp rhonchi as well   CV:  RRR  no s3 or murmur or increase in P2,  Trace to 1+    pitting lower ext sym edema   ABD:  soft and nontender with nl excursion in the supine position. No bruits or organomegaly, bowel sounds nl  MS:  warm without deformities, calf tenderness, cyanosis or clubbing  SKIN: warm and dry without lesions        Lab Results  Component Value Date   ESRSEDRATE  25 06/28/2017   ESRSEDRATE 30 07/27/2016   ESRSEDRATE 32 (H) 07/16/2014        Labs ordered 06/28/2017  Collagen vasc profile, HSP profile

## 2017-06-28 NOTE — Telephone Encounter (Signed)
ATC Gilda with [facility], office is closed. Will call back on 06/29/17.

## 2017-06-28 NOTE — Patient Instructions (Signed)
Please remember to go to the lab department downstairs in the basement  for your tests - we will call you with the results when they are available.      Prednisone 20 mg until better then taper down to 5 mg if tolerated and go back to previous if worse   See Tammy NP in 6  weeks with all your medications, even over the counter meds, separated in two separate bags, the ones you take no matter what vs the ones you stop once you feel better and take only as needed when you feel you need them.   Tammy  will generate for you a new user friendly medication calendar that will put [place] all on the same page re: your medication use.     Without this process, it simply isn't possible to assure that we are providing  your outpatient care  with  the attention to detail we feel you deserve.   If we cannot assure that you're getting that kind of care,  then we cannot manage your problem effectively from this clinic.  Once you have seen Tammy and we are sure that we're all on the same page with your medication use she will arrange follow up with me.

## 2017-06-29 NOTE — Telephone Encounter (Signed)
Spoke with pt, aware of recs.  ono ordered.  Nothing further needed.

## 2017-06-29 NOTE — Telephone Encounter (Signed)
Ok to order [employer] RA

## 2017-06-29 NOTE — Telephone Encounter (Signed)
Fine with me, we submitted also for ono RA for same reason

## 2017-06-29 NOTE — Telephone Encounter (Signed)
Order has been placed for cont O2. Gilda with [facility] is aware and voiced her understanding. I do not see where ONO has been ordered nor does MW's 06/28/17 OV note state he'd wish to order an ONO.  MW please advise if you would like for ONO on RA to be ordered. Thanks.

## 2017-06-29 NOTE — Telephone Encounter (Signed)
Left message with answering service for Gilda.  Will await call back.

## 2017-06-29 NOTE — Telephone Encounter (Signed)
Spoke with Rod Holler at [facility], states that insurance will not pay for exertional O2 only- prescription must be for continuous O2.  Rod Holler states she spoke with the pt yesterday to make aware of this.  MW please advise if ok to change order to "continuous" instead of "with exertion only" for insurance purposes.  Thanks.

## 2017-07-01 NOTE — Assessment & Plan Note (Addendum)
See cxr 09/18/13 - rec avoid all macrodantin exp 12/23/2013  - recurrent macrodantin exposure last filled rx  06/2014 [facility] >cxr much worse 07/16/14 with ESR 32 > started short course prednisone  - med calendar 08/01/2014  -CT chest 08/06/14 >Pulmonary parenchymal pattern of fibrosis, as described above, appears progressive when compared with chest radiographs dating back to 07/27/2009. 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  - 08/29/2014  Walked RA x 3 laps @ 185 ft each stopped due to  End of study mod pace, sats 91% - 09/29/2014  Walked RA x 3 laps @ 185 ft each stopped due to end of study sat 93% and fast pace   - 09/29/2014 PFTs  VC 1.70 (64%) s obst and dlco 57 corrects to 98% - 03/30/2015   PFTs  VC 1.98 (75%) s obst and dlco 66 corrects to 107  - PFT's  01/26/2016   VC 1.71 (65%) s obst   with DLCO  65/63 % corrects to 104  % for alv volume  - Daily pred rx 07/27/2016 for refractory cough /sob (PF vs airways)   - PFT's  12/26/2016   VC 1.65 (64%)  and no obst p  symb 160 prior to study with DLCO  46 % corrects to 84 % for alv volume  On pred 5 mg daily  - 12/26/2016  Walked RA x 3 laps @ 185 ft each stopped due to  End of study, nl pace, no sob or desat  - 03/28/2017   Walked RA  2 laps @ 185 ft each stopped due to  Sob with desat to 85% at fast pace on pred 10 mg per day p flaring off it   - 05/17/2017   Walked RA  2 laps @ 185 ft each stopped due to  Sob/ sats 93% at end   - HRCT 06/22/2017 1. Significant progression of fibrotic interstitial lung disease with mild bibasilar honeycombing. Stable chronic patchy air trapping in the lungs. This combination of findings raises suspicion of chronic hypersensitivity pneumonitis, although the fibrotic changes are dominant compared to the relatively mild air trapping, and the significant progression with basilar honeycombing is more compatible with usual interstitial pneumonia  (UIP). 2. Newly dilated main pulmonary artery, suggesting pulmonary arterial hypertension. - 06/28/2017 collagen vasc profile neg/ esr 25, HSP serology  >>>  It does appear she is progressive c/w UIP ? Related to macrodantin or not, she may qualify for trial of ofev or esbriet but will first see if she again responds to higher dose of pred and bring back for full med reconciliation first   For now maintain on pred 20 mg daily until better then and only then should she attempt to taper prior to return ov s checking with this office   To keep things simple, I have asked the patient to first separate medicines that are perceived as maintenance, that is to be taken daily "no matter what", from those medicines that are taken on only on an as-needed basis and I have given the patient examples of both, and then return to see our NP to generate a  detailed  medication calendar which should be followed until the next physician sees the patient and updates it.     I had an extended discussion with the patient reviewing all relevant studies completed to date and  lasting 15 to 20 minutes of a 25 minute visit    Each maintenance medication was reviewed  in detail including most importantly the difference between maintenance and prns and under what circumstances the prns are to be triggered using an action plan format that is not reflected in the computer generated alphabetically organized AVS.    Please see AVS for specific instructions unique to this visit that I personally wrote and verbalized to the the pt in detail and then reviewed with pt  by my nurse highlighting any  changes in therapy recommended at today's visit to their plan of care.

## 2017-07-01 NOTE — Assessment & Plan Note (Signed)
Patient Saturations on Room Air at Rest = 97% Patient Saturations on [employer] while Ambulating = 87% Patient Saturations on 2 Liters of oxygen while Ambulating = 92% > rec 2lpm 24/7 esp since PA's are large on HRCT 06/22/17 - ono RA 07/01/2017 >>>

## 2017-07-03 ENCOUNTER — Ambulatory Visit: Payer: Medicare Other

## 2017-07-03 LAB — HYPERSENSITIVITY PNUEMONITIS PROFILE
ASPERGILLUS FUMIGATUS: NEGATIVE
FAENIA RETIVIRGULA: NEGATIVE
Pigeon Serum: NEGATIVE
S. VIRIDIS: NEGATIVE
T. CANDIDUS: NEGATIVE
T. VULGARIS: NEGATIVE

## 2017-07-03 LAB — RHEUMATOID FACTOR: Rhuematoid fact SerPl-aCnc: <14 IU/mL (ref <14)

## 2017-07-03 LAB — ANA: ANA: NEGATIVE

## 2017-07-03 LAB — CYCLIC CITRUL PEPTIDE ANTIBODY, IGG: Cyclic Citrullin Peptide Ab: <16 UNITS

## 2017-07-04 ENCOUNTER — Ambulatory Visit
Admission: RE | Admit: 2017-07-04 | Discharge: 2017-07-04 | Disposition: A | Discharge status: Home / Self Care | Payer: Medicare Other | Source: Ambulatory Visit | Attending: Family Medicine | Admitting: Family Medicine

## 2017-07-04 DIAGNOSIS — Z1231 Encounter for screening mammogram for malignant neoplasm of breast: Secondary | ICD-10-CM

## 2017-07-04 NOTE — Progress Notes (Signed)
Spoke with pt and notified of results per Dr. Wert. Pt verbalized understanding and denied any questions. 

## 2017-07-08 ENCOUNTER — Other Ambulatory Visit: Payer: Self-pay | Admitting: Internal Medicine

## 2017-08-09 ENCOUNTER — Encounter: Payer: Self-pay | Admitting: Adult Health

## 2017-08-09 ENCOUNTER — Other Ambulatory Visit: Payer: Self-pay | Admitting: Internal Medicine

## 2017-08-09 ENCOUNTER — Other Ambulatory Visit (INDEPENDENT_AMBULATORY_CARE_PROVIDER_SITE_OTHER): Payer: Medicare Other

## 2017-08-09 ENCOUNTER — Ambulatory Visit: Payer: Medicare Other | Admitting: Adult Health

## 2017-08-09 ENCOUNTER — Ambulatory Visit (INDEPENDENT_AMBULATORY_CARE_PROVIDER_SITE_OTHER)
Admission: RE | Admit: 2017-08-09 | Discharge: 2017-08-09 | Disposition: A | Discharge status: Home / Self Care | Payer: Medicare Other | Source: Ambulatory Visit | Attending: Adult Health | Admitting: Adult Health

## 2017-08-09 VITALS — BP 130/90 | HR 95 | Ht 62.0 in | Wt 169.0 lb

## 2017-08-09 DIAGNOSIS — J9611 Chronic respiratory failure with hypoxia: Secondary | ICD-10-CM

## 2017-08-09 DIAGNOSIS — J704 Drug-induced interstitial lung disorders, unspecified: Secondary | ICD-10-CM

## 2017-08-09 DIAGNOSIS — R0781 Pleurodynia: Secondary | ICD-10-CM

## 2017-08-09 DIAGNOSIS — R05 Cough: Secondary | ICD-10-CM

## 2017-08-09 DIAGNOSIS — Z23 Encounter for immunization: Secondary | ICD-10-CM

## 2017-08-09 DIAGNOSIS — R059 Cough, unspecified: Secondary | ICD-10-CM

## 2017-08-09 DIAGNOSIS — J45991 Cough variant asthma: Secondary | ICD-10-CM | POA: Diagnosis not present

## 2017-08-09 LAB — CBC WITH DIFFERENTIAL/PLATELET
BASOS ABS: 0.1 10*3/uL (ref 0.0–0.1)
Basophils Relative: 0.7 % (ref 0.0–3.0)
Eosinophils Absolute: 0.3 10*3/uL (ref 0.0–0.7)
Eosinophils Relative: 2.1 % (ref 0.0–5.0)
HEMATOCRIT: 44.5 % (ref 36.0–46.0)
Hemoglobin: 14.6 g/dL (ref 12.0–15.0)
LYMPHS PCT: 10.9 % — AB (ref 12.0–46.0)
Lymphs Abs: 1.4 10*3/uL (ref 0.7–4.0)
MCHC: 32.8 g/dL (ref 30.0–36.0)
MCV: 95.1 fl (ref 78.0–100.0)
MONOS PCT: 6 % (ref 3.0–12.0)
Monocytes Absolute: 0.8 10*3/uL (ref 0.1–1.0)
NEUTROS PCT: 80.3 % — AB (ref 43.0–77.0)
Neutro Abs: 10.7 10*3/uL — ABNORMAL HIGH (ref 1.4–7.7)
Platelets: 250 10*3/uL (ref 150.0–400.0)
RBC: 4.69 Mil/uL (ref 3.87–5.11)
RDW: 14.3 % (ref 11.5–15.5)
WBC: 13.3 10*3/uL — ABNORMAL HIGH (ref 4.0–10.5)

## 2017-08-09 NOTE — Patient Instructions (Addendum)
Continue on current regimen  Chest xray with left rib detail today . Labs today .  Follow med calendar closely and bring to each visit.  Follow up with Dr. Sherene SiresWert  In 2 months and As needed   Pneumovax today .

## 2017-08-09 NOTE — Progress Notes (Signed)
@Patient  ID: Casey Freeman, female    DOB: 1936-02-26, 81 y.o.   MRN: [id number]  Chief Complaint  Patient presents with  . Follow-up    asthna     Referring provider: Shirline Frees, MD  HPI: 81 yo female never smoker seen for initial pulmonary consult 10/2013 for dyspnea and cough found to have PF s/p macrodantin (stopped 12/2013)  And cough variant asthma   TEST  CT chest 08/06/14 >Pulmonary parenchymal pattern of fibrosis, as described above, appears progressive when compared with chest radiographs dating back to 07/27/2009. 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    08/29/2014  Walked RA x 3 laps @ 185 ft each stopped due to  End of study mod pace, sats 91%ce    - 09/29/2014 PFTs  VC 1.70 (64%) s obst and dlco 57 corrects to 98%  - 03/30/2015   PFTs  VC 1.98 (75%) s obst and dlco 66 corrects to 107   - PFT's  01/26/2016   VC 1.71 (65%) s obst   with DLCO  65/63 % corrects to 104  % for alv volume   - Daily pred rx 07/27/2016 for refractory cough /sob (PF vs airways)    -I0/2017 >IgE 643, Eosinophils 11%, 1200 .   - PFT's  12/26/2016   VC 1.65 (64%)  and no obst p  symb 160 prior to study with DLCO  46 % corrects to 84 % for alv volume  On pred 5 mg daily   - 12/26/2016  Walked RA x 3 laps @ 185 ft each stopped due to  End of study, nl pace, no sob or desat   - 03/28/2017   Walked RA  2 laps @ 185 ft each stopped due to  Sob with desat to 85% at fast pace on pred 10 mg per day p flaring off it    - 05/17/2017   Walked RA  2 laps @ 185 ft each stopped due to  Sob/ sats 93% at end    - HRCT 06/22/2017 1. Significant progression of fibrotic interstitial lung disease with mild bibasilar honeycombing. Stable chronic patchy air trapping in the lungs. This combination of findings raises suspicion of chronic hypersensitivity pneumonitis, although the fibrotic changes are dominant compared to the relatively mild air  trapping, and the significant progression with basilar honeycombing is more compatible with usual interstitial pneumonia (UIP). 2. Newly dilated main pulmonary artery, suggesting pulmonary arterial hypertension. - 06/28/2017 collagen vasc profile neg/ esr 25, HSP serology  >>>neg    08/09/2017 Follow up : PF , Cough variant asthma  81 Pt returns for a 6 week follow up . She has been having progressive dyspnea /DOE over last several months. HRCT chest 06/2017 showed significant progression of ILD. ?c/w UIP. Autoimmune/CTD w/up was neg.  She was instructed to increase prednisone 79m daily. And taper down slowly to baseline dose at 140mdaily . Has been unable to go down below 1055maily.  She is feeling better since last visit. Breathing is some better . Has to do activities and rest frequently .  Gets winded with prolonged walking, hills/steps and inclines are very hard for her. Feels oxygen is really is helping her.  Has been having some left lateral rib pain , sore to touch . She bent down to reach something and now it is sore there.  Discussed pulm rehab. She wants to wait till winter is over.  Under a lot of stress, husband  has dementia .   Reviewed all meds and organized them into med calendar with pt education . Appears to be taking correctly .     Allergies  Allergen Reactions  . Doxycycline Other (See Comments)    Sore throat   . Hydrocodone     Makes her dizzy  . Macrodantin [Nitrofurantoin Macrocrystal]     Lung Dz  . Simvastatin Rash    Immunization History  Administered Date(s) Administered  . Influenza Split 07/03/2013, 07/03/2014, 07/04/2015  . Influenza, High Dose Seasonal PF 06/12/2017  . Influenza,inj,Quad PF,6+ Mos 06/07/2016  . Pneumococcal Conjugate-13 05/01/2014  . Pneumococcal Polysaccharide-23 08/09/2017    Past Medical History:  Diagnosis Date  . Arthritis   . Hx: UTI (urinary tract infection)   . Hypertension   . PONV (postoperative nausea and vomiting)     >20 YRS AGO - NO PROBLEM SINCE    Tobacco History: Social History   Tobacco Use  Smoking Status Never Smoker  Smokeless Tobacco Never Used   Counseling given: Not Answered   Outpatient Encounter Medications as of 08/09/2017  Medication Sig  . acetaminophen (TYLENOL) 500 MG tablet Take per bottle as needed for pain  . albuterol (PROAIR HFA) 108 (90 Base) MCG/ACT inhaler Inhale 2 puffs into the lungs every 4 (four) hours as needed for wheezing or shortness of breath.  . Ascorbic Acid (VITAMIN C PO) Take 1 tablet by mouth daily.  Marland Kitchen aspirin 81 MG tablet Take 81 mg by mouth 2 (two) times a week. On Tuesdays and Thursdays  . Azelastine HCl 0.15 % SOLN Place 1 spray into both nostrils at bedtime.  . budesonide-formoterol (SYMBICORT) 160-4.5 MCG/ACT inhaler Inhale 2 puffs into the lungs 2 (two) times daily.  . cetirizine (ZYRTEC) 10 MG tablet Take 10 mg by mouth daily.  . chlorpheniramine (CHLOR-TRIMETON) 4 MG tablet Take 1 tablet by mouth every 4 hours as needed for drippy nose, drainage, and throat clearing  . Cholecalciferol (VITAMIN D) 2000 UNITS CAPS Take 1 capsule by mouth daily.  Marland Kitchen dextromethorphan (DELSYM) 30 MG/5ML liquid 2 tsp every 12 hours as needed for cough  . famotidine (PEPCID) 20 MG tablet Take 20 mg by mouth at bedtime.   . hydrochlorothiazide (HYDRODIURIL) 25 MG tablet Take 25 mg by mouth daily as needed (leg swelling).   . LORazepam (ATIVAN) 0.5 MG tablet Take 0.5 mg by mouth 2 (two) times daily.   . Magnesium 250 MG TABS Take 1 tablet by mouth daily. Reported on 12/11/2015  . pantoprazole (PROTONIX) 40 MG tablet TAKE 1 TABLET (40 MG TOTAL) BY MOUTH DAILY BEFORE BREAKFAST.  Marland Kitchen predniSONE (DELTASONE) 10 MG tablet TAKE 2 TABLETS BY MOUTH EVERY MORNING UNTIL BETTER THEN 1 TABLET EVERY DAY  . telmisartan (MICARDIS) 40 MG tablet Take 40 mg by mouth daily.  . traMADol (ULTRAM) 50 MG tablet Take 50-100 mg by mouth every 4 (four) hours as needed (if still coughing).   . TRAVATAN  Z 0.004 % SOLN ophthalmic solution Apply 1 drop to eye at bedtime.   No facility-administered encounter medications on file as of 08/09/2017.      Review of Systems  Constitutional:   No  weight loss, night sweats,  Fevers, chills, +fatigue, or  lassitude.  HEENT:   No headaches,  Difficulty swallowing,  Tooth/dental problems, or  Sore throat,                No sneezing, itching, ear ache, nasal congestion, post nasal drip,   CV:  No chest pain,  Orthopnea, PND, swelling in lower extremities, anasarca, dizziness, palpitations, syncope.   GI  No heartburn, indigestion, abdominal pain, nausea, vomiting, diarrhea, change in bowel habits, loss of appetite, bloody stools.   Resp:   No chest wall deformity  Skin: no rash or lesions.  GU: no dysuria, change in color of urine, no urgency or frequency.  No flank pain, no hematuria   MS:  No joint pain or swelling.  No decreased range of motion.  No back pain.    Physical Exam  BP 130/90 (BP Location: Left Arm, Cuff Size: Normal)   Pulse 95   Ht 5' 2"  (1.575 m)   Wt 169 lb (76.7 kg)   SpO2 95%   BMI 30.91 kg/m   GEN: A/Ox3; pleasant , NAD, elderly on O2    HEENT:  [place]/AT,  EACs-clear, TMs-wnl, NOSE-clear, THROAT-clear, no lesions, no postnasal drip or exudate noted.   NECK:  Supple w/ fair ROM; no JVD; normal carotid impulses w/o bruits; no thyromegaly or nodules palpated; no lymphadenopathy.    RESP  Bilateral crackles ,  no accessory muscle use, no dullness to percussion  CARD:  RRR, no m/r/g, tr  peripheral edema, pulses intact, no cyanosis or clubbing.  GI:   Soft & nt; nml bowel sounds; no organomegaly or masses detected.   Musco: Warm bil, no deformities or joint swelling noted.   Neuro: alert, no focal deficits noted.    Skin: Warm, no lesions or rashes    Lab Results:  CBC    Component Value Date/Time   WBC 13.3 (H) 08/09/2017 1120   RBC 4.69 08/09/2017 1120   HGB 14.6 08/09/2017 1120   HCT 44.5 08/09/2017  1120   PLT 250.0 08/09/2017 1120   MCV 95.1 08/09/2017 1120   MCH 30.5 05/04/2012 0402   MCHC 32.8 08/09/2017 1120   RDW 14.3 08/09/2017 1120   LYMPHSABS 1.4 08/09/2017 1120   MONOABS 0.8 08/09/2017 1120   EOSABS 0.3 08/09/2017 1120   BASOSABS 0.1 08/09/2017 1120    BMET    Component Value Date/Time   NA 138 07/05/2016 1249   K 4.4 07/05/2016 1249   CL 101 07/05/2016 1249   CO2 29 07/05/2016 1249   GLUCOSE 108 (H) 07/05/2016 1249   BUN 11 07/05/2016 1249   CREATININE 0.66 07/05/2016 1249   CALCIUM 9.1 07/05/2016 1249   GFRNONAA 89 (L) 05/03/2012 0355   GFRAA >90 05/03/2012 0355    BNP No results found for: BNP  ProBNP    Component Value Date/Time   PROBNP 32.0 07/05/2016 1249    Imaging: Dg Chest 2 View  Result Date: 08/09/2017 CLINICAL DATA:  LEFT lower anterior rib pain, pain with deep breathing, chronic cough, congestion, shortness of breath, history of hypertension EXAM: CHEST  2 VIEW COMPARISON:  05/17/2017 FINDINGS: Normal heart size, mediastinal contours, and pulmonary vascularity. Diffuse interstitial thickening through both lungs consistent with chronic interstitial lung disease/pulmonary fibrosis. No definite superimposed acute infiltrate, pleural effusion or pneumothorax. Osseous demineralization with degenerative disc disease changes thoracic spine. IMPRESSION: Pulmonary fibrosis. No definite acute superimposed infiltrate identified. Electronically Signed   By: Lavonia Dana M.D.   On: 08/09/2017 11:45     Assessment & Plan:   Drug-induced diffuse interstitial pulmonary fibrosis (HCC) Progressive changes on CT chest and symptoms with oxygen dependence  Slight improvement in sx with prednisone burst. Unable to go below 67m daily  [facility] pull[facility] rehab currently   Plan  Patient Instructions  Continue on current regimen  Chest xray with left rib detail today . Labs today .  Follow med calendar closely and bring to each visit.  Follow up with Dr. Melvyn Novas  In  2 months and As needed   Pneumovax today .      Cough variant asthma No flare  IgE and Eosinophils were elvated in past  Recheck today .   Chronic respiratory failure with hypoxia (HCC) Cont on O2 .   Cough Cough and left rib pain  Check cxr w/ rib details.       Rexene Edison, NP 08/09/2017

## 2017-08-09 NOTE — Assessment & Plan Note (Signed)
Progressive changes on CT chest and symptoms with oxygen dependence  Slight improvement in sx with prednisone burst. Unable to go below 10mg  daily  [facility] currently   Plan  Patient Instructions  Continue on current regimen  Chest xray with left rib detail today . Labs today .  Follow med calendar closely and bring to each visit.  Follow up with Dr. Sherene SiresWert  In 2 months and As needed   Pneumovax today .

## 2017-08-09 NOTE — Assessment & Plan Note (Signed)
Cough and left rib pain  Check cxr w/ rib details.

## 2017-08-09 NOTE — Assessment & Plan Note (Signed)
No flare  IgE and Eosinophils were elvated in past  Recheck today .

## 2017-08-09 NOTE — Assessment & Plan Note (Signed)
Cont on O2 .  

## 2017-08-10 LAB — IGE: IGE (IMMUNOGLOBULIN E), SERUM: 133 kU/L — AB (ref <=114)

## 2017-08-10 NOTE — Progress Notes (Signed)
Chart and office note reviewed in detail  > agree with a/p as outlined    

## 2017-08-14 NOTE — Progress Notes (Signed)
Called spoke with patient, advised of lab results / recs as stated by TP.  Pt verbalized her understanding and denied any questions. 

## 2017-08-14 NOTE — Progress Notes (Signed)
Called spoke with patient, advised of cxr results / recs as stated by TP.  Pt verbalized her understanding and denied any questions. 

## 2017-10-09 ENCOUNTER — Encounter: Payer: Self-pay | Admitting: Internal Medicine

## 2017-10-09 ENCOUNTER — Ambulatory Visit: Payer: Medicare Other | Admitting: Internal Medicine

## 2017-10-09 VITALS — BP 124/76 | HR 103 | Ht 62.0 in | Wt 166.0 lb

## 2017-10-09 DIAGNOSIS — J9611 Chronic respiratory failure with hypoxia: Secondary | ICD-10-CM

## 2017-10-09 DIAGNOSIS — J704 Drug-induced interstitial lung disorders, unspecified: Secondary | ICD-10-CM

## 2017-10-09 DIAGNOSIS — J45991 Cough variant asthma: Secondary | ICD-10-CM

## 2017-10-09 MED ORDER — BUDESONIDE-FORMOTEROL FUMARATE 80-4.5 MCG/ACT IN AERO: 2.0000 | INHALATION_SPRAY | Freq: Two times a day (BID) | RESPIRATORY_TRACT | 0 refills | Status: DC

## 2017-10-09 MED ORDER — BUDESONIDE-FORMOTEROL FUMARATE 80-4.5 MCG/ACT IN AERO: 2.0000 | INHALATION_SPRAY | Freq: Two times a day (BID) | RESPIRATORY_TRACT | 11 refills | Status: DC

## 2017-10-09 NOTE — Assessment & Plan Note (Addendum)
Patient Saturations on Room Air at Rest = 97% Patient Saturations on [employer] while Ambulating = 87% Patient Saturations on 2 Liters of oxygen while Ambulating = 92% > rec 2lpm 24/7 esp since PA's are large on HRCT 06/22/17   As of 10/09/2017   2lpm continuous at bedtime and with  exertion

## 2017-10-09 NOTE — Patient Instructions (Addendum)
Use 02 continuous just while sleeping or walking  - other activities like sitting watching tv just pulse as needed to keep it over 90%  Try symbicort 80 Take 2 puffs first thing in am and then another 2 puffs about 12 hours later.    Work on inhaler technique:  relax and gently blow all the way out then take a nice smooth deep breath back in, triggering the inhaler at same time you start breathing in.  Hold for up to 5 seconds if you can. Blow out thru nose. Rinse and gargle with water when done   No mint products !  See calendar for specific medication instructions and bring it back for each and every office visit for every healthcare provider you see.  Without it,  you may not receive the best quality medical care that we feel you deserve.  You will note that the calendar groups together  your maintenance  medications that are timed at particular times of the day.  Think of this as your checklist for what your doctor has instructed you to do until your next evaluation to see what benefit  there is  to staying on a consistent group of medications intended to keep you well.  The other group at the bottom is entirely up to you to use as you see fit  for specific symptoms that may arise between visits that require you to treat them on an as needed basis.  Think of this as your action plan or "what if" list.   Separating the top medications from the bottom group is fundamental to providing you adequate care going forward.     Please schedule a follow up visit in 3 months but call sooner if needed

## 2017-10-09 NOTE — Progress Notes (Signed)
Subjective:    Patient ID: Casey Freeman, female    DOB: Nov 24, 1935    MRN: [id number]   Brief patient profile:  35  yowf never smoker with h/o watery rhinitis/ cough x sev months typically in fall since around 2009 then new onset sob and cough intially dx as bronchitis late 2013  rx zpak and cough syrup and better but  Then newly  started on prn albuterol for wheezing rarely needed until Aug 2014  referred 10/04/2013 by Dr Tiburcio Pea for progressively worse  Wheeze/ cough  despite adding symbicort.  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    History of Present Illness  10/04/2013 1st [facility] office visit/ Melida Northington cc daily sob/ cough x 4-5  m assoc with variable sob to point where has trouble sometimes with adls and even sometimes sob at rest if coughing assoc with hoarseness, dry cough and subwheeze day > night. rec Stop linsinopril  benicar 20/12.5 one daily  Try prilosec 20mg   Take 30-60 min before first meal of the day and Pepcid 20 mg one bedtime until cough is completely gone for at least a week without the need for cough suppression Best cough medication is delsym GERD diet  Prednisone 10 mg take  4 each am x 2 days,   2 each am x 2 days,  1 each am x 2 days and stop  Only use your albuterol as needed     07/27/2016  f/u ov/Casey Freeman re: persistent cough /sob in pt with PF s/p macrodantin and prob asthma on symb 59  Chief Complaint  Patient presents with  . Follow-up    Cough not improving at all. She is feeling more SOB. She was winded walking just lobby to exam room today.    sob across the room / cough is mostly clear day >> noct  Always better on prednisone but worse as tapers off >>pred 20mg  then hold 10mg  when better.      09/27/2016  f/u ov/Casey Freeman re: pf/ AB/pnds on maint rx with pred 10 mg daily and symb 160 2bid  Chief Complaint  Patient presents with  . Follow-up    4 weeks, still SOB and has a cough from drainage,  she does feel better   [facility] = can't walk a nl pace on a flat grade s sob but does fine slow and flat eg ok at [facility] Has med calendar and following  rec When doing better, reduce the prednisone to 10 mg one half daily - if doing worse, increase to 2 daily  Work on inhaler technique:  See calendar for specific medication instructions       03/28/2017  f/u ov/Casey Freeman re: PF / AB prednisone  10 mg daily  Chief Complaint  Patient presents with  . Follow-up    Pt states her breathing is unchanged, Pt states the heat is affecting her breathing, she has noticed she has needed her pro air more, she has been having increase sob with exertion, coughing with some thick mucus, She has a hard time producing mucus, she feels like it is stuck in her chest, Denies chest tightness,fever   much worse when stopped pred x 1 week  Better after 20 and then tapered to 10 mg daily for the last month prior to OV   Sleeps ok  Walks up to an hour tiw shopping s stopping albeit slow pace s 02 (though note walked very fast pace in office)  rec For cough try mucinex dm up to 1200 mg as needed See calendar for specific medication instructions and bring it back for each and every office visit for every healthcare provider you see.   Separating the top medications from the bottom group is fundamental to providing you adequate care going forward.    05/17/2017  f/u ov/Casey Freeman re:  PF /cough variant asthma  Chief Complaint  Patient presents with  . Acute Visit    pt reports of increased sob and chest tightness with exertion, occ prod cough with clear mucus & occ wheezing.  increased pred to 20 mg daily from 10 mg daily baseline x 2 days prior to OV  And "no better"  proair helps breathing some but only using  Few times a day at most, never noct , and only if over does it  Legs weak several times a week stays that way all day and thinks it correlates with finger sats but they were proven inaccurate today  Cough no worse than  usual  rec Walk at slower pace Work on inhaler technique: Stay on 20 mg prednisone / day until better then try to taper to 5 mg  Daily  Please remember to go to the  x-ray department downstairs in the basement  for your tests - we will call you with the results when they are available. Keep appt in September but call sooner if needed  Add schedule hrct for return > ? HSP   06/28/2017  f/u ov/Casey Freeman re: weaned pred to 5 mg per day  Chief Complaint  Patient presents with  . Follow-up    Breathing is about the same "worse at times"- having trouble with minimal exertion such as folding clothes. She states that she is pleased with how well her cough is doing. She is using proair 2 x daily on average.   proair seems to help despite using symb 160 bid as maint but hfa not optimal  New oximeter x 3 weeks dropping with activity on 5 m pred per day  No pet/ bird/hay exposure  rec Please remember to go to the lab department downstairs in the basement  for your tests - we will call you with the results when they are available.  Prednisone 20 mg until better then taper down to 5 mg if tolerated and go back to previous if worse   Thgiving 2018 worse on 5 mg so increased to 10 mg daily    10/09/2017  f/u ov/Casey Freeman re:  PF steroid dep at 10 mg daily  Chief Complaint  Patient presents with  . Follow-up    Breathing is unchanged. She is using her albuterol inhaler 2-3 x per wk on average.    doe = [facility]MR[facility] = can't walk 100 yards even at a slow pace at a flat grade s stopping due to sob  Even on 02    Still taking delsym 2 tsp hs but sleeping well  No obvious day to day or daytime variability or assoc excess/ purulent sputum or mucus plugs or hemoptysis or cp or chest tightness, subjective wheeze or overt sinus or hb symptoms. No unusual exposure hx or h/o childhood pna/ asthma or knowledge of premature birth.  Sleeping ok flat without nocturnal  or early am exacerbation  of respiratory  c/o's or need for  noct saba. Also denies any obvious fluctuation of symptoms with weather or environmental changes or other aggravating or alleviating factors except as outlined above   Current Allergies,  Complete Past Medical History, Past Surgical History, Family History, and Social History were reviewed in [facility] electronic medical record.  ROS  The following are not active complaints unless bolded Hoarseness, sore throat, dysphagia, dental problems, itching, sneezing,  nasal congestion or discharge of excess mucus or purulent secretions, ear ache,   fever, chills, sweats, unintended wt loss or wt gain, classically pleuritic or exertional cp,  orthopnea pnd or leg swelling, presyncope, palpitations, abdominal pain, anorexia, nausea, vomiting, diarrhea  or change in bowel habits or change in bladder habits, change in stools or change in urine, dysuria, hematuria,  rash, arthralgias, visual complaints, headache, numbness, weakness or ataxia or problems with walking or coordination,  change in mood/affect or memory.        Current Meds  Medication Sig  . acetaminophen (TYLENOL) 500 MG tablet Take per bottle as needed for pain  . albuterol (PROAIR HFA) 108 (90 Base) MCG/ACT inhaler Inhale 2 puffs into the lungs every 4 (four) hours as needed for wheezing or shortness of breath.  . Ascorbic Acid (VITAMIN C PO) Take 1 tablet by mouth daily.  Marland Kitchen. aspirin 81 MG tablet Take 81 mg by mouth 2 (two) times a week. On Tuesdays and Thursdays  . Azelastine HCl 0.15 % SOLN Place 1 spray into both nostrils at bedtime.  . cetirizine (ZYRTEC) 10 MG tablet Take 10 mg by mouth daily.  . chlorpheniramine (CHLOR-TRIMETON) 4 MG tablet Take 1 tablet by mouth every 4 hours as needed for drippy nose, drainage, and throat clearing  . Cholecalciferol (VITAMIN D) 2000 UNITS CAPS Take 1 capsule by mouth daily.  Marland Kitchen. dextromethorphan (DELSYM) 30 MG/5ML liquid 2 tsp every 12 hours as needed for cough  . famotidine (PEPCID) 20 MG tablet Take  20 mg by mouth at bedtime.   . hydrochlorothiazide (HYDRODIURIL) 25 MG tablet Take 25 mg by mouth daily as needed (leg swelling).   . LORazepam (ATIVAN) 0.5 MG tablet Take 0.5 mg by mouth 2 (two) times daily.   . Magnesium 250 MG TABS Take 1 tablet by mouth daily. Reported on 12/11/2015  . pantoprazole (PROTONIX) 40 MG tablet TAKE 1 TABLET (40 MG TOTAL) BY MOUTH DAILY BEFORE BREAKFAST.  Marland Kitchen. predniSONE (DELTASONE) 10 MG tablet TAKE 2 TABLETS BY MOUTH EVERY MORNING UNTIL BETTER THEN 1 TABLET EVERY DAY  . telmisartan (MICARDIS) 40 MG tablet Take 40 mg by mouth daily.  . traMADol (ULTRAM) 50 MG tablet Take 50-100 mg by mouth every 4 (four) hours as needed (if still coughing).   . TRAVATAN Z 0.004 % SOLN ophthalmic solution Apply 1 drop to eye at bedtime.  . [  budesonide-formoterol (SYMBICORT) 160-4.5 MCG/ACT inhaler Inhale 2 puffs into the lungs 2 (two) times daily.                              Objective:   Physical Exam     amb wf nad  Vital signs reviewed - Note on arrival 02 sats  91% on  2lpm     11/05/2013  184 > 11/25/2013   190 > 12/23/2013  190 > 07/16/2014 174  >172 08/01/2014 > 08/18/2014  171 >  09/29/2014 174 >  03/30/2015 178 > 06/14/2015  178 >  179 06/26/2015 > 09/25/2015 178 > 179 12/11/2015 > 01/26/2016   187 > 03/24/2016   182 >  06/10/2016   171 >  07/05/2016 > 07/27/2016 165 > 09/27/2016  174 > 12/26/2016  176 > 03/28/2017  177 > 05/17/2017   178 > 06/28/2017  173 > 10/09/2017   166      LUNGS:     Pops and squeaks on insp bilaterally   CV:  RRR  no s3 or murmur or increase in P2,  Trace      pitting lower ext sym edema/ wearing elastic hose    HEENT: nl dentition, turbinates bilaterally, and oropharynx. Nl external ear canals without cough reflex   NECK :  without JVD/Nodes/TM/ nl carotid upstrokes bilaterally   LUNGS: no acc muscle use,  Nl contour chest with insp pops/squeaks bilaterally    CV:  RRR  no s3 or murmur or increase in P2, and trace ptting lower ext edema wearing  elastic hose   ABD:  soft and nontender with nl inspiratory excursion in the supine position. No bruits or organomegaly appreciated, bowel sounds nl  MS:  Nl gait/ ext warm without deformities, calf tenderness, cyanosis or clubbing No obvious joint restrictions   SKIN: warm and dry without lesions    NEURO:  alert, approp, nl sensorium with  no motor or cerebellar deficits apparent.           Lab Results  Component Value Date   ESRSEDRATE 25 06/28/2017   ESRSEDRATE 30 07/27/2016   ESRSEDRATE 32 (H) 07/16/2014

## 2017-10-11 ENCOUNTER — Encounter: Payer: Self-pay | Admitting: Internal Medicine

## 2017-10-11 NOTE — Assessment & Plan Note (Signed)
-   hfa 75% 11/05/13 > rx dulera 100 2bid > changed to symbicort 160 2bid 11/25/13 > improved 09/29/2014 > ok to taper off as of 03/30/15 > flared off - Allergy profile 11/05/2013 >  Eos 6.1%  IgE 363 but RAST only pos to dust - NO 09/25/2015  = 31  - 09/25/2015    try off pm symbicort  - FENO 06/10/2016  =   75 on symb 80 2bid though hfa not optimal  - 07/27/2016  After extensive coaching HFA effectiveness =  75 % (Ti too short)  - Allergy profile 07/27/16  >  Eos 1.2 /  IgE  643 RAST pos dust > cockroach  - Daily pred rx 07/27/2016  -med calendar 08/30/2016   - 05/17/2017   continue symbicort 160 2bid - 10/09/2017  After extensive coaching inhaler device  effectiveness =    75% (short Ti) > try symb 80 2bid since may have component of uacs

## 2017-10-11 NOTE — Assessment & Plan Note (Signed)
See cxr 09/18/13 - rec avoid all macrodantin exp 12/23/2013  - recurrent macrodantin exposure last filled rx  [facility] [place] >cxr much worse 07/16/14 with ESR 32 > started short course prednisone  - med calendar 08/01/2014  -CT chest 08/06/14 >Pulmonary parenchymal pattern of fibrosis, as described above, appears progressive when compared with chest radiographs dating back to 07/27/2009. 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  - 08/29/2014  Walked RA x 3 laps @ 185 ft each stopped due to  End of study mod pace, sats 91% - 09/29/2014  Walked RA x 3 laps @ 185 ft each stopped due to end of study sat 93% and fast pace   - 09/29/2014 PFTs  VC 1.70 (64%) s obst and dlco 57 corrects to 98% - 03/30/2015   PFTs  VC 1.98 (75%) s obst and dlco 66 corrects to 107  - PFT's  01/26/2016   VC 1.71 (65%) s obst   with DLCO  65/63 % corrects to 104  % for alv volume  - Daily pred rx 07/27/2016 for refractory cough /sob (PF vs airways)   - PFT's  12/26/2016   VC 1.65 (64%)  and no obst p  symb 160 prior to study with DLCO  46 % corrects to 84 % for alv volume  On pred 5 mg daily  - 12/26/2016  Walked RA x 3 laps @ 185 ft each stopped due to  End of study, nl pace, no sob or desat  - 03/28/2017   Walked RA  2 laps @ 185 ft each stopped due to  Sob with desat to 85% at fast pace on pred 10 mg per day p flaring off it   - 05/17/2017   Walked RA  2 laps @ 185 ft each stopped due to  Sob/ sats 93% at end   - HRCT 06/22/2017 1. Significant progression of fibrotic interstitial lung disease with mild bibasilar honeycombing. Stable chronic patchy air trapping in the lungs. This combination of findings raises suspicion of chronic hypersensitivity pneumonitis, although the fibrotic changes are dominant compared to the relatively mild air trapping, and the significant progression with basilar honeycombing is more compatible with usual interstitial pneumonia  (UIP). 2. Newly dilated main pulmonary artery, suggesting pulmonary arterial hypertension. - 06/28/2017 collagen vasc profile neg/ esr 25, HSP serology  >>> neg    The goal with a chronic steroid dependent illness is always arriving at the lowest effective dose that controls the disease/symptoms and not accepting a set "formula" which is based on statistics or guidelines that don't always take into account patient  variability or the natural hx of the dz in every individual patient, which may well vary over time.  For now therefore I recommend the patient maintain  20 mg ceiling and 10 mg /day floor   I had an extended discussion with the patient reviewing all relevant studies completed to date and  lasting 15 to 20 minutes of a 25 minute visit    Each maintenance medication was reviewed in detail including most importantly the difference between maintenance and prns and under what circumstances the prns are to be triggered using an action plan format that is not reflected in the computer generated alphabetically organized AVS but trather by a customized med calendar that reflects the AVS meds with confirmed 100% correlation.   In addition, Please see AVS for unique instructions that I personally wrote and verbalized to the the pt in detail  and then reviewed with pt  by my nurse highlighting any  changes in therapy recommended at today's visit to their plan of care.

## 2017-11-06 ENCOUNTER — Telehealth: Payer: Self-pay | Admitting: Internal Medicine

## 2017-11-06 MED ORDER — BUDESONIDE-FORMOTEROL FUMARATE 160-4.5 MCG/ACT IN AERO: 2.0000 | INHALATION_SPRAY | Freq: Two times a day (BID) | RESPIRATORY_TRACT | 6 refills | Status: DC

## 2017-11-06 NOTE — Telephone Encounter (Signed)
Called pt and advised message from the provider. Pt understood and verbalized understanding. Nothing further is needed.   [facility]

## 2017-11-06 NOTE — Telephone Encounter (Signed)
Fine with me to increase to symb 160 2bid

## 2017-11-06 NOTE — Telephone Encounter (Signed)
Spoke with patient. She stated that during her 10/09/17 visit, she was prescribed a lower dose of Symbicort. Since being on the Symbicort 80, her SOB and wheezing symptoms have returned. She was advised after her visit to call back if the 80mcg wasn't working for her.   She wishes to have a RX of [facility]ort [facility] [address].   MW, please advise. Thanks!

## 2017-11-17 ENCOUNTER — Other Ambulatory Visit: Payer: Self-pay | Admitting: Family Medicine

## 2017-11-17 ENCOUNTER — Ambulatory Visit
Admission: RE | Admit: 2017-11-17 | Discharge: 2017-11-17 | Disposition: A | Discharge status: Home / Self Care | Payer: Medicare Other | Source: Ambulatory Visit | Attending: Family Medicine | Admitting: Family Medicine

## 2017-11-17 DIAGNOSIS — R1084 Generalized abdominal pain: Secondary | ICD-10-CM

## 2017-12-30 IMAGING — CT CT CHEST HIGH RESOLUTION W/O CM
2 of 5 series · 15 of 36 positions shown, 18 images · non-contrast
Comparison: 05/17/2017 chest radiograph. 08/06/2014 high-resolution
chest CT.

CLINICAL DATA: Worsening dyspnea. Follow-up pulmonary fibrosis.
History of Macrodantin therapy.

EXAM:
CT CHEST WITHOUT CONTRAST
TECHNIQUE: Multidetector CT imaging of the chest was performed following the
standard protocol without intravenous contrast. High resolution
imaging of the lungs, as well as inspiratory and expiratory imaging,
was performed.

[Series 2: high resolution · axial · 0.63mm/px · z∈[-263,-19]mm · 12 of 134 slices shown, 15 images]
[im 6/134  mediastinal]
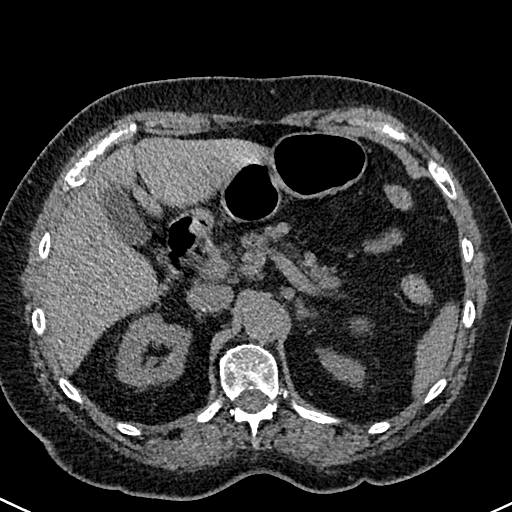
[im 6/134  lung]
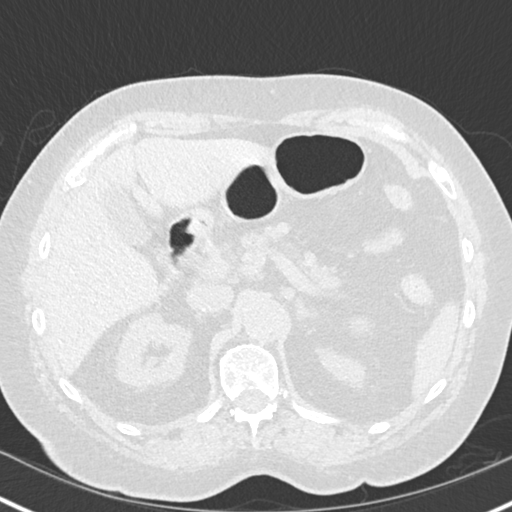
[im 18/134  lung]
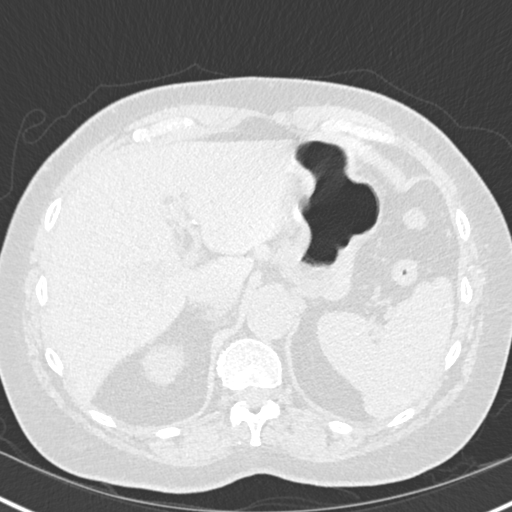
[im 29/134  lung]
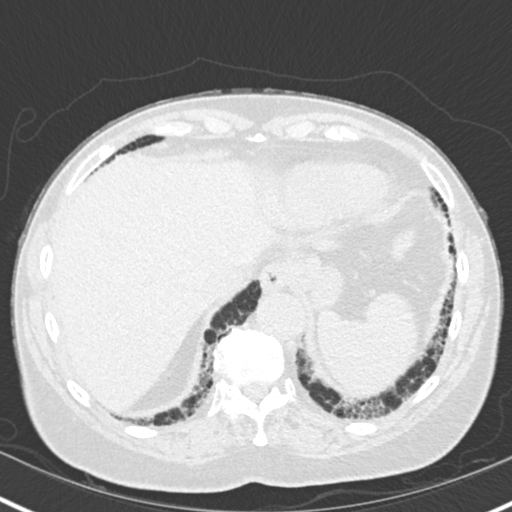
[im 41/134  lung]
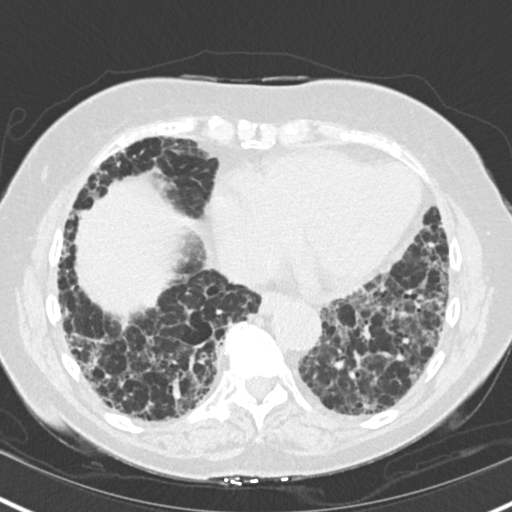
[im 53/134  mediastinal]
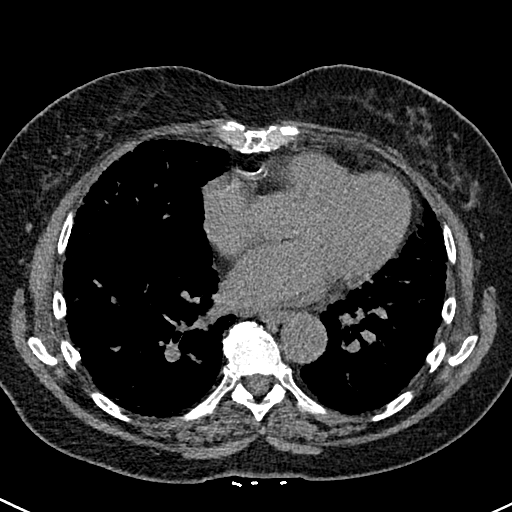
[im 53/134  lung]
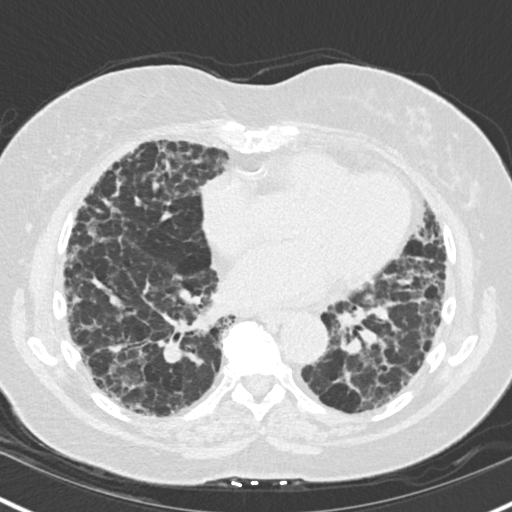
[im 64/134  lung]
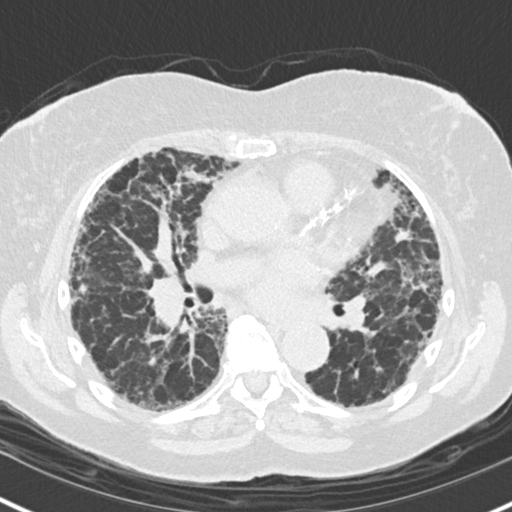
[im 70/134  lung]
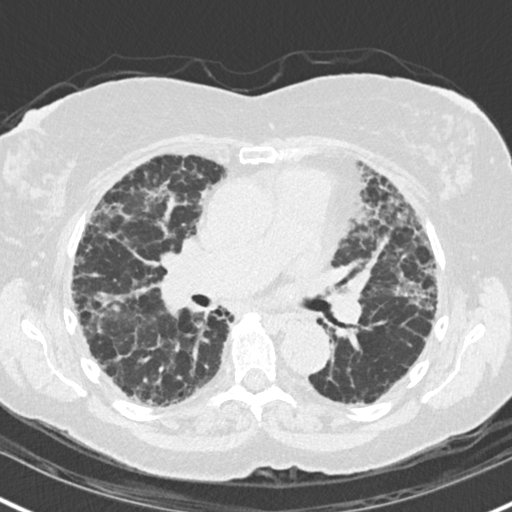
[im 81/134  lung]
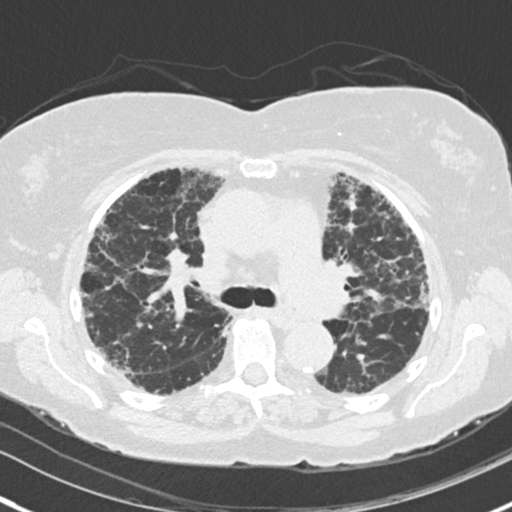
[im 93/134  mediastinal]
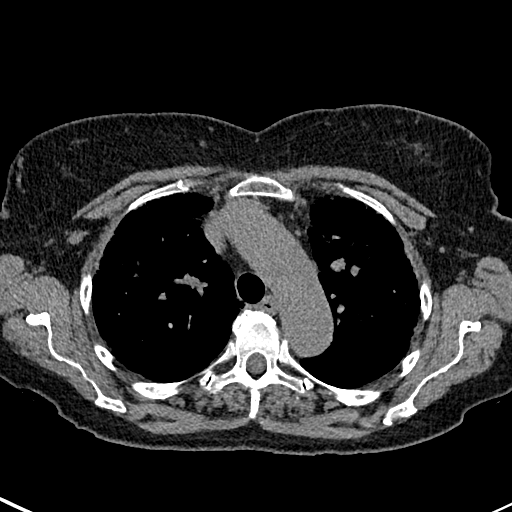
[im 93/134  lung]
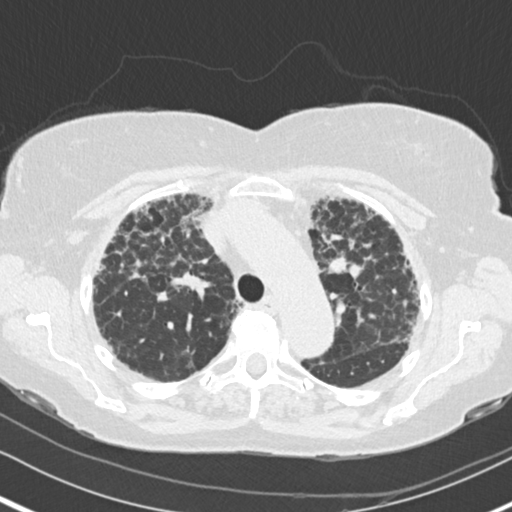
[im 105/134  lung]
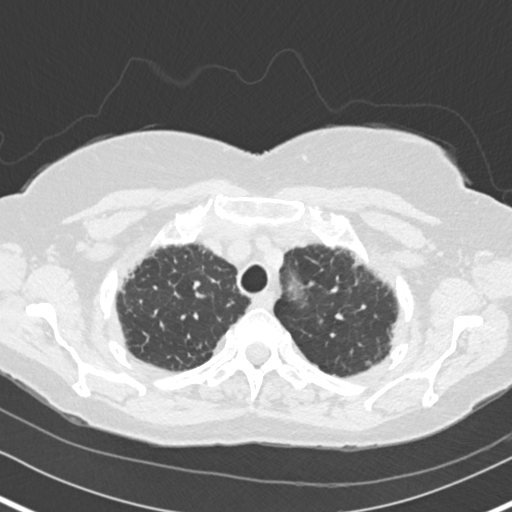
[im 116/134  lung]
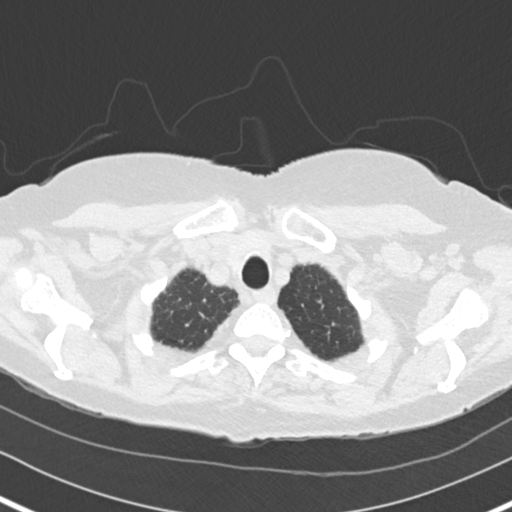
[im 128/134  lung]
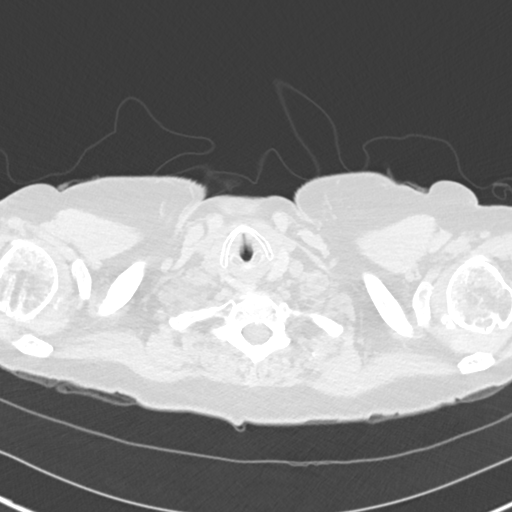

[Series 8: coronal · coronal · 0.57mm/px · 3 of 110 slices shown]
[im 22/110  lung]
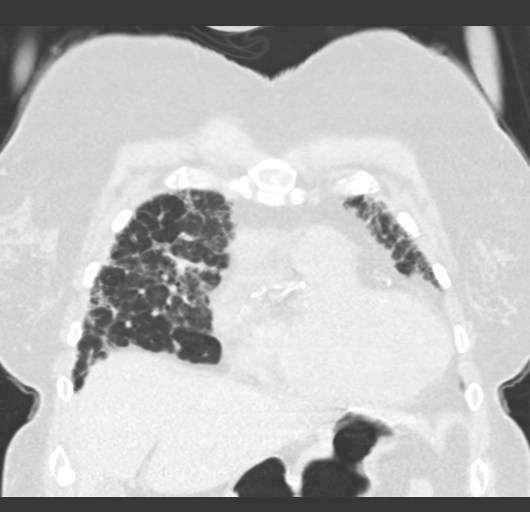
[im 44/110  lung]
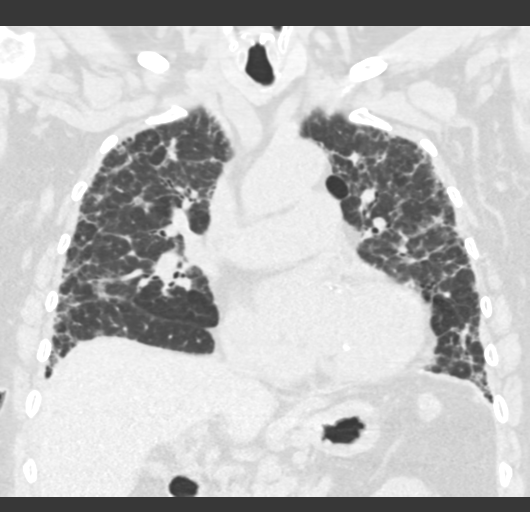
[im 66/110  lung]
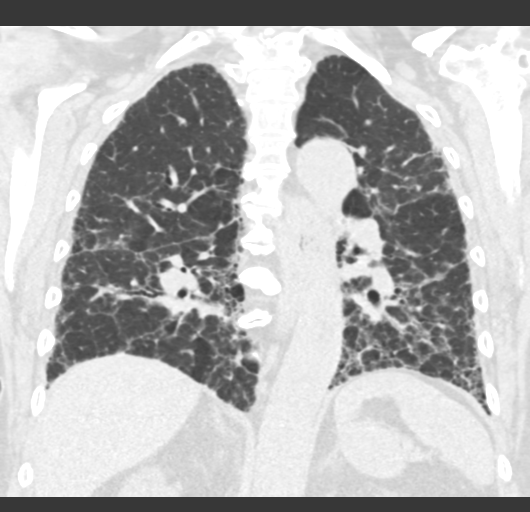

[15 of 36 positions shown; findings below may reference images not displayed]

FINDINGS: Cardiovascular: Top normal heart size. No significant pericardial
fluid/thickening. Left anterior descending, left circumflex and
right coronary atherosclerosis. Atherosclerotic nonaneurysmal
thoracic aorta. Dilated main pulmonary artery (3.5 cm diameter,
increased from 3.0 cm on 08/06/2014).

Mediastinum/Nodes: No discrete thyroid nodules. Unremarkable
esophagus. No pathologically enlarged axillary, mediastinal or gross
hilar lymph nodes, noting limited sensitivity for the detection of
hilar adenopathy on this noncontrast study.

Lungs/Pleura: No pneumothorax. No pleural effusion. No acute
consolidative airspace disease, lung masses or significant pulmonary
nodules. Mild patchy air trapping in both lungs, unchanged. There is
extensive patchy confluent peribronchovascular and subpleural
reticulation throughout both lungs with associated mild traction
bronchiolectasis and architectural distortion. There are scattered
small foci of honeycombing at both lung bases. No clear basilar
gradient. These findings have significantly worsened since
08/06/2014 chest CT.

Upper abdomen: Unremarkable.

Musculoskeletal: No aggressive appearing focal osseous lesions.
Moderate thoracic spondylosis.
IMPRESSION: 1. Significant progression of fibrotic interstitial lung disease
with mild bibasilar honeycombing. Stable chronic patchy air trapping
in the lungs. This combination of findings raises suspicion of
chronic hypersensitivity pneumonitis, although the fibrotic changes
are dominant compared to the relatively mild air trapping, and the
significant progression with basilar honeycombing is more compatible
with usual interstitial pneumonia (UIP).
2. Newly dilated main pulmonary artery, suggesting pulmonary
arterial hypertension.
3. Three-vessel coronary atherosclerosis.

Aortic Atherosclerosis ([id number]-C4F.F).

## 2018-01-02 ENCOUNTER — Ambulatory Visit (INDEPENDENT_AMBULATORY_CARE_PROVIDER_SITE_OTHER)
Admission: RE | Admit: 2018-01-02 | Discharge: 2018-01-02 | Disposition: A | Discharge status: Home / Self Care | Payer: Medicare Other | Source: Ambulatory Visit | Attending: Internal Medicine | Admitting: Internal Medicine

## 2018-01-02 ENCOUNTER — Ambulatory Visit: Payer: Medicare Other | Admitting: Internal Medicine

## 2018-01-02 ENCOUNTER — Encounter: Payer: Self-pay | Admitting: Internal Medicine

## 2018-01-02 VITALS — BP 130/84 | HR 89 | Temp 97.6°F | Ht 62.0 in | Wt 169.0 lb

## 2018-01-02 DIAGNOSIS — J9611 Chronic respiratory failure with hypoxia: Secondary | ICD-10-CM

## 2018-01-02 DIAGNOSIS — J704 Drug-induced interstitial lung disorders, unspecified: Secondary | ICD-10-CM

## 2018-01-02 DIAGNOSIS — R059 Cough, unspecified: Secondary | ICD-10-CM

## 2018-01-02 DIAGNOSIS — R05 Cough: Secondary | ICD-10-CM

## 2018-01-02 MED ORDER — AZITHROMYCIN 250 MG PO TABS: ORAL_TABLET | ORAL | 0 refills | Status: DC

## 2018-01-02 MED ORDER — PREDNISONE 10 MG PO TABS: ORAL_TABLET | ORAL | 1 refills | Status: DC

## 2018-01-02 MED ORDER — PANTOPRAZOLE SODIUM 40 MG PO TBEC: 40.0000 mg | DELAYED_RELEASE_TABLET | Freq: Every day | ORAL | 2 refills | Status: DC

## 2018-01-02 NOTE — Progress Notes (Signed)
Subjective:    Patient ID: Casey Freeman, female    DOB: Nov 24, 1935    MRN: [id number]   Brief patient profile:  35  yowf never smoker with h/o watery rhinitis/ cough x sev months typically in fall since around 2009 then new onset sob and cough intially dx as bronchitis late 2013  rx zpak and cough syrup and better but  Then newly  started on prn albuterol for wheezing rarely needed until Aug 2014  referred 10/04/2013 by Dr Tiburcio Pea for progressively worse  Wheeze/ cough  despite adding symbicort.  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    History of Present Illness  10/04/2013 1st [facility] office visit/ Casey Freeman cc daily sob/ cough x 4-5  m assoc with variable sob to point where has trouble sometimes with adls and even sometimes sob at rest if coughing assoc with hoarseness, dry cough and subwheeze day > night. rec Stop linsinopril  benicar 20/12.5 one daily  Try prilosec 20mg   Take 30-60 min before first meal of the day and Pepcid 20 mg one bedtime until cough is completely gone for at least a week without the need for cough suppression Best cough medication is delsym GERD diet  Prednisone 10 mg take  4 each am x 2 days,   2 each am x 2 days,  1 each am x 2 days and stop  Only use your albuterol as needed     07/27/2016  f/u ov/Casey Freeman re: persistent cough /sob in pt with PF s/p macrodantin and prob asthma on symb 59  Chief Complaint  Patient presents with  . Follow-up    Cough not improving at all. She is feeling more SOB. She was winded walking just lobby to exam room today.    sob across the room / cough is mostly clear day >> noct  Always better on prednisone but worse as tapers off >>pred 20mg  then hold 10mg  when better.      09/27/2016  f/u ov/Casey Freeman re: pf/ AB/pnds on maint rx with pred 10 mg daily and symb 160 2bid  Chief Complaint  Patient presents with  . Follow-up    4 weeks, still SOB and has a cough from drainage,  she does feel better   [facility] = can't walk a nl pace on a flat grade s sob but does fine slow and flat eg ok at [facility] Has med calendar and following  rec When doing better, reduce the prednisone to 10 mg one half daily - if doing worse, increase to 2 daily  Work on inhaler technique:  See calendar for specific medication instructions       03/28/2017  f/u ov/Casey Freeman re: PF / AB prednisone  10 mg daily  Chief Complaint  Patient presents with  . Follow-up    Pt states her breathing is unchanged, Pt states the heat is affecting her breathing, she has noticed she has needed her pro air more, she has been having increase sob with exertion, coughing with some thick mucus, She has a hard time producing mucus, she feels like it is stuck in her chest, Denies chest tightness,fever   much worse when stopped pred x 1 week  Better after 20 and then tapered to 10 mg daily for the last month prior to OV   Sleeps ok  Walks up to an hour tiw shopping s stopping albeit slow pace s 02 (though note walked very fast pace in office)  rec For cough try mucinex dm up to 1200 mg as needed See calendar for specific medication instructions and bring it back for each and every office visit for every healthcare provider you see.   Separating the top medications from the bottom group is fundamental to providing you adequate care going forward.    05/17/2017  f/u ov/Casey Freeman re:  PF /cough variant asthma  Chief Complaint  Patient presents with  . Acute Visit    pt reports of increased sob and chest tightness with exertion, occ prod cough with clear mucus & occ wheezing.  increased pred to 20 mg daily from 10 mg daily baseline x 2 days prior to OV  And "no better"  proair helps breathing some but only using  Few times a day at most, never noct , and only if over does it  Legs weak several times a week stays that way all day and thinks it correlates with finger sats but they were proven inaccurate today  Cough no worse than  usual  rec Walk at slower pace Work on inhaler technique: Stay on 20 mg prednisone / day until better then try to taper to 5 mg  Daily  Please remember to go to the  x-ray department downstairs in the basement  for your tests - we will call you with the results when they are available. Keep appt in September but call sooner if needed  Add schedule hrct for return > ? HSP   06/28/2017  f/u ov/Casey Freeman re: weaned pred to 5 mg per day  Chief Complaint  Patient presents with  . Follow-up    Breathing is about the same "worse at times"- having trouble with minimal exertion such as folding clothes. She states that she is pleased with how well her cough is doing. She is using proair 2 x daily on average.   proair seems to help despite using symb 160 bid as maint but hfa not optimal  New oximeter x 3 weeks dropping with activity on 5 m pred per day  No pet/ bird/hay exposure  rec Please remember to go to the lab department downstairs in the basement  for your tests - we will call you with the results when they are available.  Prednisone 20 mg until better then taper down to 5 mg if tolerated and go back to previous if worse   Thgiving 2018 worse on 5 mg so increased to 10 mg daily    10/09/2017  f/u ov/Casey Freeman re:  PF steroid dep at 10 mg daily  Chief Complaint  Patient presents with  . Follow-up    Breathing is unchanged. She is using her albuterol inhaler 2-3 x per wk on average.    doe = [facility]MR[facility] = can't walk 100 yards even at a slow pace at a flat grade s stopping due to sob  Even on 02    Still taking delsym 2 tsp hs but sleeping well rec Use 02 continuous just while sleeping or walking  - other activities like sitting watching tv just pulse as needed to keep it over 90% Try symbicort 80 Take 2 puffs first thing in am and then another 2 puffs about 12 hours later.  Work on inhaler technique: No mint products !   01/02/2018  Acute extenend  ov/Casey Freeman re: acute cough  Chief Complaint  Patient  presents with  . Acute Visit    Increased SOB x 1 wk. She states she started coughing up some yellow sputum  2 days ago  She has been using her albuterol 2-3 x per wk on average.  Dyspnea:  Worse since 12/29/17  Cough: more cough/ mucus turned yellow  2 d prior to OV   Sleep: still sleeping ok  SABA use: no change  Increased prednisone to 20 mg 01/01/18   No obvious day to day or daytime variability or assoc excess/ purulent sputum or mucus plugs or hemoptysis or cp or chest tightness, subjective wheeze or overt sinus or hb symptoms. No unusual exposure hx or h/o childhood pna/ asthma or knowledge of premature birth.  Sleeping ok flat without nocturnal  or early am exacerbation  of respiratory  c/o's or need for noct saba. Also denies any obvious fluctuation of symptoms with weather or environmental changes or other aggravating or alleviating factors except as outlined above   Current Allergies, Complete Past Medical History, Past Surgical History, Family History, and Social History were reviewed in [employer] record.  ROS  The following are not active complaints unless bolded Hoarseness, sore throat, dysphagia, dental problems, itching, sneezing,  nasal congestion or discharge of excess mucus or purulent secretions, ear ache,   fever, chills, sweats, unintended wt loss or wt gain, classically pleuritic or exertional cp,  orthopnea pnd or leg swelling, presyncope, palpitations, abdominal pain, anorexia, nausea, vomiting, diarrhea  or change in bowel habits or change in bladder habits, change in stools or change in urine, dysuria, hematuria,  rash, arthralgias, visual complaints, headache, numbness, weakness or ataxia or problems with walking or coordination,  change in mood/affect or memory.        Current Meds  Medication Sig  . acetaminophen (TYLENOL) 500 MG tablet Take per bottle as needed for pain  . albuterol (PROAIR HFA) 108 (90 Base) MCG/ACT inhaler Inhale 2 puffs  into the lungs every 4 (four) hours as needed for wheezing or shortness of breath.  . Ascorbic Acid (VITAMIN C PO) Take 1 tablet by mouth daily.  . Azelastine HCl 0.15 % SOLN Place 1 spray into both nostrils at bedtime as needed.   . budesonide-formoterol (SYMBICORT) 160-4.5 MCG/ACT inhaler Inhale 2 puffs into the lungs 2 (two) times daily.  . chlorpheniramine (CHLOR-TRIMETON) 4 MG tablet Take 1 tablet by mouth every 4 hours as needed for drippy nose, drainage, and throat clearing  . Cholecalciferol (VITAMIN D) 2000 UNITS CAPS Take 1 capsule by mouth daily.  Marland Kitchen dextromethorphan (DELSYM) 30 MG/5ML liquid 2 tsp every 12 hours as needed for cough  . dextromethorphan-guaiFENesin (MUCINEX DM) 30-600 MG 12hr tablet Take 2 tablets by mouth 2 (two) times daily.  . famotidine (PEPCID) 20 MG tablet Take 20 mg by mouth at bedtime.   . hydrochlorothiazide (HYDRODIURIL) 25 MG tablet Take 25 mg by mouth daily as needed (leg swelling).   . LORazepam (ATIVAN) 0.5 MG tablet Take 0.5 mg by mouth 2 (two) times daily.   . Magnesium 250 MG TABS Take 1 tablet by mouth daily. Reported on 12/11/2015  . OXYGEN 2 lpm 24/7  [employer]  . pantoprazole (PROTONIX) 40 MG tablet Take 1 tablet (40 mg total) by mouth daily before breakfast.  . predniSONE (DELTASONE) 10 MG tablet 2 daily until better then 1 daily  . telmisartan (MICARDIS) 40 MG tablet Take 40 mg by mouth daily.  . traMADol (ULTRAM) 50 MG tablet Take 50-100 mg by mouth every 4 (four) hours as needed (if still coughing).   . TRAVATAN Z 0.004 % SOLN ophthalmic solution Apply 1 drop to eye  at bedtime.  .     .                             Objective:   Physical Exam    amb slt tremulous wf nad    Vital signs reviewed - Note on arrival 02 sats  96% on 2lpm     11/05/2013  184 > 11/25/2013   190 > 12/23/2013  190 > 07/16/2014 174  >172 08/01/2014 > 08/18/2014  171 >  09/29/2014 174 >  03/30/2015 178 > 06/14/2015  178 >  179 06/26/2015 > 09/25/2015 178 > 179  12/11/2015 > 01/26/2016   187 > 03/24/2016   182 >  06/10/2016   171 >  07/05/2016 > 07/27/2016 165 > 09/27/2016  174 > 12/26/2016  176 > 03/28/2017  177 > 05/17/2017   178 > 06/28/2017  173 > 10/09/2017   166  > 01/02/2018  169      HEENT: nl dentition, turbinates bilaterally, and oropharynx. Nl external ear canals without cough reflex   NECK :  without JVD/Nodes/TM/ nl carotid upstrokes bilaterally   LUNGS: no acc muscle use,  Nl contour chest with insp pops squeaks both bases/ min exp rhonchi    CV:  RRR  no s3 or murmur or increase in P2, and trace bilaterally sym pedal edema  ABD:  soft and nontender with nl inspiratory excursion in the supine position. No bruits or organomegaly appreciated, bowel sounds nl  MS:  Nl gait/ ext warm without deformities, calf tenderness, cyanosis or clubbing No obvious joint restrictions   SKIN: warm and dry without lesions    NEURO:  alert, approp, nl sensorium with  no motor or cerebellar deficits apparent.        CXR PA and Lateral:   01/02/2018 :    I personally reviewed images and agree with radiology impression as follows:    Stable chest x-ray without evidence of acute cardiopulmonary process.

## 2018-01-02 NOTE — Patient Instructions (Addendum)
zpak   See Tammy NP w/in 4 weeks with all your medications, even over the counter meds, separated in two separate bags, the ones you take no matter what vs the ones you stop once you feel better and take only as needed when you feel you need them.   Casey Freeman  will generate for you a new user friendly medication calendar that will put us all on the same page re: your medication use.    Please remember to go to the  x-ray department downstairs in the basement  for your tests - we will call you with the results when they are available.     Follow the medication calendar as written today

## 2018-01-02 NOTE — Progress Notes (Signed)
Spoke with pt and notified of results per Dr. Wert. Pt verbalized understanding and denied any questions. 

## 2018-01-03 ENCOUNTER — Encounter: Payer: Self-pay | Admitting: Internal Medicine

## 2018-01-03 NOTE — Assessment & Plan Note (Signed)
See cxr 09/18/13 - rec avoid all macrodantin exp 12/23/2013  - recurrent macrodantin exposure last filled rx  06/2014 [facility] >cxr much worse 07/16/14 with ESR 32 > started short course prednisone  - med calendar 08/01/2014  -CT chest 08/06/14 >Pulmonary parenchymal pattern of fibrosis, as described above, appears progressive when compared with chest radiographs dating back to 07/27/2009. 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  - 08/29/2014  Walked RA x 3 laps @ 185 ft each stopped due to  End of study mod pace, sats 91% - 09/29/2014  Walked RA x 3 laps @ 185 ft each stopped due to end of study sat 93% and fast pace   - 09/29/2014 PFTs  VC 1.70 (64%) s obst and dlco 57 corrects to 98% - 03/30/2015   PFTs  VC 1.98 (75%) s obst and dlco 66 corrects to 107  - PFT's  01/26/2016   VC 1.71 (65%) s obst   with DLCO  65/63 % corrects to 104  % for alv volume  - Daily pred rx 07/27/2016 for refractory cough /sob (PF vs airways)   - PFT's  12/26/2016   VC 1.65 (64%)  and no obst p  symb 160 prior to study with DLCO  46 % corrects to 84 % for alv volume  On pred 5 mg daily  - 12/26/2016  Walked RA x 3 laps @ 185 ft each stopped due to  End of study, nl pace, no sob or desat  - 03/28/2017   Walked RA  2 laps @ 185 ft each stopped due to  Sob with desat to 85% at fast pace on pred 10 mg per day p flaring off it   - 05/17/2017   Walked RA  2 laps @ 185 ft each stopped due to  Sob/ sats 93% at end   - HRCT 06/22/2017 1. Significant progression of fibrotic interstitial lung disease with mild bibasilar honeycombing. Stable chronic patchy air trapping in the lungs. This combination of findings raises suspicion of chronic hypersensitivity pneumonitis, although the fibrotic changes are dominant compared to the relatively mild air trapping, and the significant progression with basilar honeycombing is more compatible with usual interstitial pneumonia  (UIP). 2. Newly dilated main pulmonary artery, suggesting pulmonary arterial hypertension. - 06/28/2017 collagen vasc profile neg/ esr 25, HSP serology  >>> neg       No evidence of a flare of PF but appears to still have steroid resp component  The goal with a chronic steroid dependent illness is always arriving at the lowest effective dose that controls the disease/symptoms and not accepting a set "formula" which is based on statistics or guidelines that don't always take into account patient  variability or the natural hx of the dz in every individual patient, which may well vary over time.  For now therefore I recommend the patient maintain  20 mg ceiling and 10 mg per day floor

## 2018-01-03 NOTE — Assessment & Plan Note (Signed)
Patient Saturations on Room Air at Rest = 97% Patient Saturations on [employer] Air while Ambulating = 87% Patient Saturations on 2 Liters of oxygen while Ambulating = 92% > rec 2lpm 24/7 esp since PA's are large on HRCT 06/22/17   As of 01/02/2018   2lpm continuous at bedtime and titrate daytime  To keep > 90% sats

## 2018-01-03 NOTE — Assessment & Plan Note (Signed)
-   ACEi d/c 10/04/2013  - Sinus CT 10/18/2013 > Clear paranasal sinuses - Allergy profile 11/05/2013 >  Eos 6.1%  IgE 363 but RAST only pos to dust - ENT eval 04/14/16 [place] c/w scarring from prev surgery only  - Allergy profile 07/27/16  >  Eos 1.2 /  IgE  643 RAST pos dust > cockroach   Acute flare in setting of uri/ rec zpak - all other symptoms have been addressed in the action plan of her med cal as reviewed below  I had an extended discussion with the patient reviewing all relevant studies completed to date and  lasting 15 to 20 minutes of a 25 minute visit    Each maintenance medication was reviewed in detail including most importantly the difference between maintenance and prns and under what circumstances the prns are to be triggered using an action plan format that is not reflected in the computer generated alphabetically organized AVS but trather by a customized med calendar that reflects the AVS meds with confirmed 100% correlation.   In addition, Please see AVS for unique instructions that I personally wrote and verbalized to the the pt in detail and then reviewed with pt  by my nurse highlighting any  changes in therapy recommended at today's visit to their plan of care.

## 2018-01-08 ENCOUNTER — Ambulatory Visit: Payer: Medicare Other | Admitting: Internal Medicine

## 2018-01-30 ENCOUNTER — Encounter: Payer: Self-pay | Admitting: Adult Health

## 2018-01-30 ENCOUNTER — Ambulatory Visit: Payer: Medicare Other | Admitting: Adult Health

## 2018-01-30 DIAGNOSIS — J9611 Chronic respiratory failure with hypoxia: Secondary | ICD-10-CM

## 2018-01-30 DIAGNOSIS — J45991 Cough variant asthma: Secondary | ICD-10-CM

## 2018-01-30 DIAGNOSIS — J704 Drug-induced interstitial lung disorders, unspecified: Secondary | ICD-10-CM

## 2018-01-30 NOTE — Assessment & Plan Note (Signed)
Controlled on rx   Plan  Patient Instructions  Continue on Oxygen 2l/m rest and 4/m with activity .  Follow med calendar closely and bring to each visit.  Follow up with Dr. Sherene Sires  In 2 months and As needed

## 2018-01-30 NOTE — Assessment & Plan Note (Signed)
Pt with notable clinical progression /CT progression over last year. She is to adjust oxygen to keep sats >88-90%. Continue on low dose steroids  Encouraged on activity to help with conditioning . Declines pulmonary rehab.   Patient's medications were reviewed today and patient education was given. Computerized medication calendar was adjusted/completed   Plan  Patient Instructions  Continue on Oxygen 2l/m rest and 4/m with activity .  Follow med calendar closely and bring to each visit.  Follow up with Dr. Sherene Sires  In 2 months and As needed

## 2018-01-30 NOTE — Patient Instructions (Addendum)
Continue on Oxygen 2l/m rest and 4/m with activity .  Follow med calendar closely and bring to each visit.  Follow up with Dr. Sherene Sires  In 2 months and As needed

## 2018-01-30 NOTE — Addendum Note (Signed)
Addended by: Boone Master E on: 01/30/2018 11:23 AM   Modules accepted: Orders

## 2018-01-30 NOTE — Progress Notes (Signed)
_0  ID: Casey Freeman, female    DOB: 1936-07-30, 82 y.o.   MRN: [id number]  Chief Complaint  Patient presents with  . Follow-up    IPF     Referring provider: Shirline Frees, MD  HPI: 82 yo female never smoker seen for initial pulmonary consult 10/2013 for dyspnea and cough found to have PF s/p macrodantin (stopped 12/2013)  And cough variant asthma   TEST  CT chest 08/06/14 >Pulmonary parenchymal pattern of fibrosis, as described above, appears progressive when compared with chest radiographs dating back to 07/27/2009. 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   08/29/2014 Walked RA x 3 laps @ 185 ft each stopped due to End of study mod pace, sats 91%ce   - 09/29/2014 PFTs VC 1.70 (64%) s obst and dlco 57 corrects to 98%  - 03/30/2015 PFTs VC 1.98 (75%) s obst and dlco 66 corrects to 107   - PFT's 01/26/2016 VC 1.71 (65%) s obst with DLCO 65/63 % corrects to 104 % for alv volume   - Daily pred rx 07/27/2016 for refractory cough /sob (PF vs airways)   -I0/2017 >IgE 643, Eosinophils 11%, 1200 .   - PFT's 12/26/2016 VC 1.65 (64%) and no obst p symb 160 prior to study with DLCO 46 % corrects to 84 % for alv volume On pred 5 mg daily   - 12/26/2016 Walked RA x 3 laps @ 185 ft each stopped due to End of study, nl pace, no sob or desat   - 03/28/2017 Walked RA 2 laps @ 185 ft each stopped due to Sob with desat to 85% at fast pace on pred 10 mg per day p flaring off it   - 05/17/2017 Walked RA 2 laps @ 185 ft each stopped due to Sob/ sats 93% at end   08/2017 IgE 643>133 , Eosinophils 1200>300 . (on prednisone 14m )   - HRCT 06/22/2017 1. Significant progression of fibrotic interstitial lung disease with mild bibasilar honeycombing. Stable chronic patchy air trapping in the lungs. This combination of findings raises suspicion of chronic hypersensitivity pneumonitis,  although the fibrotic changes are dominant compared to the relatively mild air trapping, and the significant progression with basilar honeycombing is more compatible with usual interstitial pneumonia (UIP). 2. Newly dilated main pulmonary artery, suggesting pulmonary arterial hypertension.  - 06/28/2017 collagen vasc profile neg/ esr 25, HSP serology >>>neg   08/2017 IgE 643>133 , Eosinophils 1200>300 . 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  Patient has drug-induced pulmonary fibrosis with previous Macrodantin use.  High-resolution CT chest September 2019 showed significant progression of her ILD.  Autoimmune and connective tissue work-up was unrevealing.  Patient has been on chronic steroids and has been unable to go below 10 mg prednisone daily. Last visit patient with increased cough.  She was given a Z-Pak and prednisone was increased to 20 mg daily.  She is feeling some better but still gets winded with minimal activity . Over last year has gotten progressively worse . She is very active at home but has to rest often due to dyspnea.   She remains on oxygen 2l/m . She now uses only continuous flow only.  Walk test in office pt needs 4l/m to keep sats >88% with activity .   She is under a lot of stress as her husband  has dementia.  We reviewed all her medications and organize them into a medication calendar with patient education.  It appears she is taking her medications correctly   Allergies  Allergen Reactions  . Doxycycline Other (See Comments)    Sore throat   . Hydrocodone     Makes her dizzy  . Macrodantin [Nitrofurantoin Macrocrystal]     Lung Dz  . Simvastatin Rash    Immunization History  Administered Date(s) Administered  . Influenza Split 07/03/2013, 07/03/2014, 07/04/2015  . Influenza, High Dose Seasonal PF 06/12/2017  .  Influenza,inj,Quad PF,6+ Mos 06/07/2016  . Pneumococcal Conjugate-13 05/01/2014  . Pneumococcal Polysaccharide-23 08/09/2017    Past Medical History:  Diagnosis Date  . Arthritis   . Hx: UTI (urinary tract infection)   . Hypertension   . PONV (postoperative nausea and vomiting)    >20 YRS AGO - NO PROBLEM SINCE    Tobacco History: Social History   Tobacco Use  Smoking Status Never Smoker  Smokeless Tobacco Never Used   Counseling given: Not Answered   Outpatient Encounter Medications as of 01/30/2018  Medication Sig  . acetaminophen (TYLENOL) 500 MG tablet Take per bottle as needed for pain  . albuterol (PROAIR HFA) 108 (90 Base) MCG/ACT inhaler Inhale 2 puffs into the lungs every 4 (four) hours as needed for wheezing or shortness of breath.  . Ascorbic Acid (VITAMIN C PO) Take 1 tablet by mouth daily.  . Azelastine HCl 0.15 % SOLN Place 1 spray into both nostrils at bedtime as needed.   Marland Kitchen azithromycin (ZITHROMAX) 250 MG tablet Take 2 on day one then 1 daily x 4 days  . budesonide-formoterol (SYMBICORT) 160-4.5 MCG/ACT inhaler Inhale 2 puffs into the lungs 2 (two) times daily.  . chlorpheniramine (CHLOR-TRIMETON) 4 MG tablet Take 1 tablet by mouth every 4 hours as needed for drippy nose, drainage, and throat clearing  . Cholecalciferol (VITAMIN D) 2000 UNITS CAPS Take 1 capsule by mouth daily.  Marland Kitchen dextromethorphan (DELSYM) 30 MG/5ML liquid 2 tsp every 12 hours as needed for cough  . dextromethorphan-guaiFENesin (MUCINEX DM) 30-600 MG 12hr tablet Take 2 tablets by mouth 2 (two) times daily.  . famotidine (PEPCID) 20 MG tablet Take 20 mg by mouth at bedtime.   . hydrochlorothiazide (HYDRODIURIL) 25 MG tablet Take 25 mg by mouth daily as needed (leg swelling).   . LORazepam (ATIVAN) 0.5 MG tablet Take 0.5 mg by mouth 2 (two) times daily.   . Magnesium 250 MG TABS Take 1 tablet by mouth daily. Reported on 12/11/2015  . OXYGEN 2 lpm 24/7  [facility]  . pantoprazole (PROTONIX) 40 MG  tablet Take 1 tablet (40 mg total) by mouth daily before breakfast.  . predniSONE (DELTASONE) 10 MG tablet 2 daily until better then 1 daily  . telmisartan (MICARDIS) 40 MG tablet Take 40 mg by mouth daily.  . traMADol (ULTRAM) 50 MG tablet Take 50-100 mg by mouth every 4 (four) hours as needed (if still coughing).   . TRAVATAN Z 0.004 % SOLN ophthalmic solution Apply 1 drop to eye at bedtime.   No facility-administered encounter medications on file as of 01/30/2018.      Review of Systems  Constitutional:   No  weight loss, night sweats,  Fevers, chills,  +fatigue, or  lassitude.  HEENT:   No headaches,  Difficulty swallowing,  Tooth/dental problems, or  Sore throat,                No sneezing, itching, ear  ache, nasal congestion, post nasal drip,   CV:  No chest pain,  Orthopnea, PND, swelling in lower extremities, anasarca, dizziness, palpitations, syncope.   GI  No heartburn, indigestion, abdominal pain, nausea, vomiting, diarrhea, change in bowel habits, loss of appetite, bloody stools.   Resp:   No chest wall deformity  Skin: no rash or lesions.  GU: no dysuria, change in color of urine, no urgency or frequency.  No flank pain, no hematuria   MS:  No joint pain or swelling.  No decreased range of motion.  No back pain.    Physical Exam  BP 122/64 (BP Location: Left Arm, Cuff Size: Normal)   Pulse 82   Ht _0  (1.575 m)   Wt 167 lb 6.4 oz (75.9 kg)   SpO2 98%   BMI 30.62 kg/m   GEN: A/Ox3; pleasant , NAD, elderly on O2    HEENT:  [place]/AT,  EACs-clear, TMs-wnl, NOSE-clear, THROAT-clear, no lesions, no postnasal drip or exudate noted.   NECK:  Supple w/ fair ROM; no JVD; normal carotid impulses w/o bruits; no thyromegaly or nodules palpated; no lymphadenopathy.    RESP  BB crackles  no accessory muscle use, no dullness to percussion  CARD:  RRR, no m/r/g, tr-1+  peripheral edema, pulses intact, no cyanosis or clubbing.  GI:   Soft & nt; nml bowel sounds; no  organomegaly or masses detected.   Musco: Warm bil, no deformities or joint swelling noted.   Neuro: alert, no focal deficits noted.    Skin: Warm, no lesions or rashes    Lab Results:  CBC  BMET  BNP No results found for: BNP  ProBNP   Imaging: Dg Chest 2 View  Result Date: 01/02/2018 CLINICAL DATA:  82 year old female with drug induced diffuse interstitial pulmonary fibrosis, chronic cough and dyspnea EXAM: CHEST - 2 VIEW COMPARISON:  Prior chest x-ray 11/17/2017 FINDINGS: Stable cardiac and mediastinal contours. No significant interval change in the pattern of a bilateral peripheral and basilar predominant subpleural reticulation, interstitial prominence and architectural distortion. No new focal airspace consolidation, pleural effusion or pneumothorax. No evidence of pulmonary edema. No acute osseous abnormality. IMPRESSION: Stable chest x-ray without evidence of acute cardiopulmonary process. Electronically Signed   By: Jacqulynn Cadet M.D.   On: 01/02/2018 11:15     Assessment & Plan:   No problem-specific Assessment & Plan notes found for this encounter.     Rexene Edison, NP 01/30/2018

## 2018-01-30 NOTE — Assessment & Plan Note (Signed)
Cont on o2 , adjust to keep sats >88-90%   Plan  Patient Instructions  Continue on Oxygen 2l/m rest and 4/m with activity .  Follow med calendar closely and bring to each visit.  Follow up with Dr. Sherene Sires  In 2 months and As needed

## 2018-01-31 NOTE — Addendum Note (Signed)
Addended by: Etheleen Mayhew C on: 01/31/2018 10:53 AM   Modules accepted: Orders

## 2018-03-30 ENCOUNTER — Other Ambulatory Visit: Payer: Self-pay | Admitting: Internal Medicine

## 2018-04-03 ENCOUNTER — Other Ambulatory Visit (INDEPENDENT_AMBULATORY_CARE_PROVIDER_SITE_OTHER): Payer: Medicare Other

## 2018-04-03 ENCOUNTER — Ambulatory Visit: Payer: Medicare Other | Admitting: Internal Medicine

## 2018-04-03 ENCOUNTER — Encounter: Payer: Self-pay | Admitting: Internal Medicine

## 2018-04-03 VITALS — BP 116/70 | HR 80 | Ht 62.0 in | Wt 168.6 lb

## 2018-04-03 DIAGNOSIS — J45991 Cough variant asthma: Secondary | ICD-10-CM

## 2018-04-03 DIAGNOSIS — R0609 Other forms of dyspnea: Secondary | ICD-10-CM

## 2018-04-03 DIAGNOSIS — J9611 Chronic respiratory failure with hypoxia: Secondary | ICD-10-CM | POA: Diagnosis not present

## 2018-04-03 DIAGNOSIS — J704 Drug-induced interstitial lung disorders, unspecified: Secondary | ICD-10-CM | POA: Diagnosis not present

## 2018-04-03 LAB — HEPATIC FUNCTION PANEL
ALBUMIN: 3.7 g/dL (ref 3.5–5.2)
ALT: 11 U/L (ref 0–35)
AST: 17 U/L (ref 0–37)
Alkaline Phosphatase: 52 U/L (ref 39–117)
Bilirubin, Direct: 0.1 mg/dL (ref 0.0–0.3)
Total Bilirubin: 0.6 mg/dL (ref 0.2–1.2)
Total Protein: 6.6 g/dL (ref 6.0–8.3)

## 2018-04-03 LAB — BASIC METABOLIC PANEL
BUN: 19 mg/dL (ref 6–23)
CHLORIDE: 99 meq/L (ref 96–112)
CO2: 32 mEq/L (ref 19–32)
Calcium: 9.3 mg/dL (ref 8.4–10.5)
Creatinine, Ser: 0.7 mg/dL (ref 0.40–1.20)
GFR: 85.63 mL/min (ref >60.00)
Glucose, Bld: 119 mg/dL — ABNORMAL HIGH (ref 70–99)
POTASSIUM: 3.5 meq/L (ref 3.5–5.1)
SODIUM: 140 meq/L (ref 135–145)

## 2018-04-03 LAB — CBC WITH DIFFERENTIAL/PLATELET
Basophils Absolute: 0.1 10*3/uL (ref 0.0–0.1)
Basophils Relative: 0.8 % (ref 0.0–3.0)
EOS PCT: 7 % — AB (ref 0.0–5.0)
Eosinophils Absolute: 0.7 10*3/uL (ref 0.0–0.7)
HCT: 40.1 % (ref 36.0–46.0)
HEMOGLOBIN: 13.5 g/dL (ref 12.0–15.0)
Lymphocytes Relative: 22.2 % (ref 12.0–46.0)
Lymphs Abs: 2.3 10*3/uL (ref 0.7–4.0)
MCHC: 33.7 g/dL (ref 30.0–36.0)
MCV: 94.5 fl (ref 78.0–100.0)
MONO ABS: 0.8 10*3/uL (ref 0.1–1.0)
MONOS PCT: 7.4 % (ref 3.0–12.0)
Neutro Abs: 6.5 10*3/uL (ref 1.4–7.7)
Neutrophils Relative %: 62.6 % (ref 43.0–77.0)
Platelets: 256 10*3/uL (ref 150.0–400.0)
RBC: 4.25 Mil/uL (ref 3.87–5.11)
RDW: 13.8 % (ref 11.5–15.5)
WBC: 10.3 10*3/uL (ref 4.0–10.5)

## 2018-04-03 LAB — BRAIN NATRIURETIC PEPTIDE: PRO B NATRI PEPTIDE: 120 pg/mL — AB (ref 0.0–100.0)

## 2018-04-03 LAB — SEDIMENTATION RATE: SED RATE: 34 mm/h — AB (ref 0–30)

## 2018-04-03 LAB — TSH: TSH: 1.11 u[IU]/mL (ref 0.35–4.50)

## 2018-04-03 MED ORDER — BUDESONIDE-FORMOTEROL FUMARATE 80-4.5 MCG/ACT IN AERO: 2.0000 | INHALATION_SPRAY | Freq: Two times a day (BID) | RESPIRATORY_TRACT | 11 refills | Status: DC

## 2018-04-03 NOTE — Patient Instructions (Addendum)
Change symbicort to 80 Take 2 puffs first thing in am and then another 2 puffs about 12 hours later.   Only use your albuterol as a rescue medication to be used if you can't catch your breath by resting or doing a relaxed purse lip breathing pattern.  - The less you use it, the better it will work when you need it. - Ok to use up to 2 puffs  every 4 hours if you must but call for immediate appointment if use goes up over your usual need - Don't leave home without it !!  (think of it like the spare tire for your car)   Be sure to avoid all mint  Ok to use 3lpm with activity with goal to keep your saturations over 90% when exerting and pace yourself   Please remember to go to the lab department downstairs in the basement  for your tests - we will call you with the results when they are available.  Follow med calendar as listed      Please schedule a follow up visit in 3 months but call sooner if needed

## 2018-04-03 NOTE — Progress Notes (Signed)
Subjective:    Patient ID: Casey Freeman, female    DOB: Nov 24, 1935    MRN: [id number]   Brief patient profile:  35  yowf never smoker with h/o watery rhinitis/ cough x sev months typically in fall since around 2009 then new onset sob and cough intially dx as bronchitis late 2013  rx zpak and cough syrup and better but  Then newly  started on prn albuterol for wheezing rarely needed until Aug 2014  referred 10/04/2013 by Dr Tiburcio Pea for progressively worse  Wheeze/ cough  despite adding symbicort.  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    History of Present Illness  10/04/2013 1st [facility] office visit/ Authur Cubit cc daily sob/ cough x 4-5  m assoc with variable sob to point where has trouble sometimes with adls and even sometimes sob at rest if coughing assoc with hoarseness, dry cough and subwheeze day > night. rec Stop linsinopril  benicar 20/12.5 one daily  Try prilosec 20mg   Take 30-60 min before first meal of the day and Pepcid 20 mg one bedtime until cough is completely gone for at least a week without the need for cough suppression Best cough medication is delsym GERD diet  Prednisone 10 mg take  4 each am x 2 days,   2 each am x 2 days,  1 each am x 2 days and stop  Only use your albuterol as needed     07/27/2016  f/u ov/Casey Freeman re: persistent cough /sob in pt with PF s/p macrodantin and prob asthma on symb 59  Chief Complaint  Patient presents with  . Follow-up    Cough not improving at all. She is feeling more SOB. She was winded walking just lobby to exam room today.    sob across the room / cough is mostly clear day >> noct  Always better on prednisone but worse as tapers off >>pred 20mg  then hold 10mg  when better.      09/27/2016  f/u ov/Casey Freeman re: pf/ AB/pnds on maint rx with pred 10 mg daily and symb 160 2bid  Chief Complaint  Patient presents with  . Follow-up    4 weeks, still SOB and has a cough from drainage,  she does feel better   [facility] = can't walk a nl pace on a flat grade s sob but does fine slow and flat eg ok at [facility] Has med calendar and following  rec When doing better, reduce the prednisone to 10 mg one half daily - if doing worse, increase to 2 daily  Work on inhaler technique:  See calendar for specific medication instructions       03/28/2017  f/u ov/Casey Freeman re: PF / AB prednisone  10 mg daily  Chief Complaint  Patient presents with  . Follow-up    Pt states her breathing is unchanged, Pt states the heat is affecting her breathing, she has noticed she has needed her pro air more, she has been having increase sob with exertion, coughing with some thick mucus, She has a hard time producing mucus, she feels like it is stuck in her chest, Denies chest tightness,fever   much worse when stopped pred x 1 week  Better after 20 and then tapered to 10 mg daily for the last month prior to OV   Sleeps ok  Walks up to an hour tiw shopping s stopping albeit slow pace s 02 (though note walked very fast pace in office)  rec For cough try mucinex dm up to 1200 mg as needed See calendar for specific medication instructions and bring it back for each and every office visit for every healthcare provider you see.   Separating the top medications from the bottom group is fundamental to providing you adequate care going forward.    05/17/2017  f/u ov/Casey Freeman re:  PF /cough variant asthma  Chief Complaint  Patient presents with  . Acute Visit    pt reports of increased sob and chest tightness with exertion, occ prod cough with clear mucus & occ wheezing.  increased pred to 20 mg daily from 10 mg daily baseline x 2 days prior to OV  And "no better"  proair helps breathing some but only using  Few times a day at most, never noct , and only if over does it  Legs weak several times a week stays that way all day and thinks it correlates with finger sats but they were proven inaccurate today  Cough no worse than  usual  rec Walk at slower pace Work on inhaler technique: Stay on 20 mg prednisone / day until better then try to taper to 5 mg  Daily  Please remember to go to the  x-ray department downstairs in the basement  for your tests - we will call you with the results when they are available. Keep appt in September but call sooner if needed  Add schedule hrct for return > ? HSP   06/28/2017  f/u ov/Casey Freeman re: weaned pred to 5 mg per day  Chief Complaint  Patient presents with  . Follow-up    Breathing is about the same "worse at times"- having trouble with minimal exertion such as folding clothes. She states that she is pleased with how well her cough is doing. She is using proair 2 x daily on average.   proair seems to help despite using symb 160 bid as maint but hfa not optimal  New oximeter x 3 weeks dropping with activity on 5 m pred per day  No pet/ bird/hay exposure  rec Please remember to go to the lab department downstairs in the basement  for your tests - we will call you with the results when they are available.  Prednisone 20 mg until better then taper down to 5 mg if tolerated and go back to previous if worse   Thgiving 2018 worse on 5 mg so increased to 10 mg daily    10/09/2017  f/u ov/Casey Freeman re:  PF steroid dep at 10 mg daily  Chief Complaint  Patient presents with  . Follow-up    Breathing is unchanged. She is using her albuterol inhaler 2-3 x per wk on average.    doe = MMRC3 = can't walk 100 yards even at a slow pace at a flat grade s stopping due to sob  Even on 02    Still taking delsym 2 tsp hs but sleeping well rec Use 02 continuous just while sleeping or walking  - other activities like sitting watching tv just pulse as needed to keep it over 90% Try symbicort 80 Take 2 puffs first thing in am and then another 2 puffs about 12 hours later.  Work on inhaler technique: No mint products !   01/02/2018  Acute extenend  ov/Casey Freeman re: acute cough  Chief Complaint  Patient  presents with  . Acute Visit    Increased SOB x 1 wk. She states she started coughing up some yellow sputum  2 days ago  She has been using her albuterol 2-3 x per wk on average.  Dyspnea:  Worse since 12/29/17  Cough: more cough/ mucus turned yellow  2 d prior to OV   Sleep: still sleeping ok  SABA use: no change  Increased prednisone to 20 mg 01/01/18  rec No change rx      04/03/2018  f/u ov/Casey Freeman re: pf / pred 10 mg daily  Chief Complaint  Patient presents with  . Follow-up    She states breathing has been worse over the past 2 wks.  She relates this to the humid weather. She is using her albuterol inhaler 1-2 x per day on average.   Dyspnea:  [employer] on 2lpm = MMRC3 = can't walk 100 yards even at a slow pace at a flat grade s stopping due to sob   Cough: worse one week prior to OV  Then better s pattern   Sleeping: about 10 degrees / 1 pillow and 2lpm - coughing spells going to bathroom but don't Freeman her up  SABA use: sev times a day  02:  2lpm but goes up to 3lpm when feels legs weak    No obvious day to day or daytime variability or assoc excess/ purulent sputum or mucus plugs or hemoptysis or cp or chest tightness, subjective wheeze or overt sinus or hb symptoms.     Also denies any obvious fluctuation of symptoms with weather or environmental changes or other aggravating or alleviating factors except as outlined above   No unusual exposure hx or h/o childhood pna/ asthma or knowledge of premature birth.  Current Allergies, Complete Past Medical History, Past Surgical History, Family History, and Social History were reviewed in [employer] record.  ROS  The following are not active complaints unless bolded Hoarseness, sore throat, dysphagia, dental problems, itching, sneezing,  nasal congestion or discharge of excess mucus or purulent secretions, ear ache,   fever, chills, sweats, unintended wt loss or wt gain, classically pleuritic or exertional cp,   orthopnea pnd or arm/hand swelling  or leg swelling, presyncope, palpitations, abdominal pain, anorexia, nausea, vomiting, diarrhea  or change in bowel habits or change in bladder habits, change in stools or change in urine, dysuria, hematuria,  rash, arthralgias, visual complaints, headache, numbness, weakness or ataxia or problems with walking or coordination,  change in mood or  memory.        Current Meds  Medication Sig  . acetaminophen (TYLENOL) 500 MG tablet Take per bottle as needed for pain  . albuterol (PROAIR HFA) 108 (90 Base) MCG/ACT inhaler Inhale 2 puffs into the lungs every 4 (four) hours as needed for wheezing or shortness of breath.  . Ascorbic Acid (VITAMIN C PO) Take 1 tablet by mouth daily.  . Azelastine HCl 0.15 % SOLN Place 1 spray into both nostrils at bedtime as needed.   . chlorpheniramine (CHLOR-TRIMETON) 4 MG tablet Take 1 tablet by mouth every 4 hours as needed for drippy nose, drainage, and throat clearing  . Cholecalciferol (VITAMIN D) 2000 UNITS CAPS Take 1 capsule by mouth daily.  . cyanocobalamin 1000 MCG tablet Take 1,000 mcg by mouth daily.  Marland Kitchen dextromethorphan (DELSYM) 30 MG/5ML liquid 2 tsp every 12 hours as needed for cough  . famotidine (PEPCID) 20 MG tablet Take 20 mg by mouth at bedtime.   . hydrochlorothiazide (HYDRODIURIL) 25 MG tablet Take 25 mg by mouth daily as needed (leg swelling).   . LORazepam (ATIVAN)  0.5 MG tablet Take 0.5 mg by mouth 2 (two) times daily.   . Magnesium 250 MG TABS Take 1 tablet by mouth daily. Reported on 12/11/2015  . OXYGEN 2 lpm 24/7  [facility]  . pantoprazole (PROTONIX) 40 MG tablet TAKE 1 TABLET (40 MG TOTAL) BY MOUTH DAILY BEFORE BREAKFAST.  Marland Kitchen predniSONE (DELTASONE) 10 MG tablet 2 daily until better then 1 daily  . telmisartan (MICARDIS) 40 MG tablet Take 40 mg by mouth daily.  . traMADol (ULTRAM) 50 MG tablet Take 50-100 mg by mouth every 4 (four) hours as needed (if still coughing).   . TRAVATAN Z 0.004 % SOLN ophthalmic  solution Apply 1 drop to eye at bedtime.  . [  budesonide-formoterol (SYMBICORT) 160-4.5 MCG/ACT inhaler Inhale 2 puffs into the lungs 2 (two) times daily.                            Objective:   Physical Exam    amb wf chewing polar ice gum   Vital signs reviewed - Note on arrival 02 sats  99% on 2lpm     11/05/2013  184 > 11/25/2013   190 > 12/23/2013  190 > 07/16/2014 174  >172 08/01/2014 > 08/18/2014  171 >  09/29/2014 174 >  03/30/2015 178 > 06/14/2015  178 >  179 06/26/2015 > 09/25/2015 178 > 179 12/11/2015 > 01/26/2016   187 > 03/24/2016   182 >  06/10/2016   171 >  07/05/2016 > 07/27/2016 165 > 09/27/2016  174 > 12/26/2016  176 > 03/28/2017  177 > 05/17/2017   178 > 06/28/2017  173 > 10/09/2017   166  > 01/02/2018  169 > 04/03/2018  168        HEENT: nl dentition, turbinates bilaterally, and oropharynx. Nl external ear canals without cough reflex   NECK :  without JVD/Nodes/TM/ nl carotid upstrokes bilaterally   LUNGS: no acc muscle use,  Nl contour chest with insp pops/ squeaks bilaterally and cough on insp   CV:  RRR  no s3 or murmur or increase in P2, and 1-2+ pitting both lower ext despite tight hose   ABD:  soft and nontender with nl inspiratory excursion in the supine position. No bruits or organomegaly appreciated, bowel sounds nl  MS:  Waddling  gait/ ext warm without deformities, calf tenderness, cyanosis or clubbing No obvious joint restrictions   SKIN: warm and dry without lesions    NEURO:  alert, approp, nl sensorium with  no motor or cerebellar deficits apparent.     Labs ordered/ reviewed:      Chemistry      Component Value Date/Time   NA 140 04/03/2018 0926   K 3.5 04/03/2018 0926   CL 99 04/03/2018 0926   CO2 32 04/03/2018 0926   BUN 19 04/03/2018 0926   CREATININE 0.70 04/03/2018 0926      Component Value Date/Time   CALCIUM 9.3 04/03/2018 0926   ALKPHOS 52 04/03/2018 0926   AST 17 04/03/2018 0926   ALT 11 04/03/2018 0926   BILITOT 0.6 04/03/2018  0926        Lab Results  Component Value Date   WBC 10.3 04/03/2018   HGB 13.5 04/03/2018   HCT 40.1 04/03/2018   MCV 94.5 04/03/2018   PLT 256.0 04/03/2018       Eos  0.7                                    04/03/2018     Lab Results  Component Value Date   TSH 1.11 04/03/2018     Lab Results  Component Value Date   PROBNP 120.0 (H) 04/03/2018       Lab Results  Component Value Date   ESRSEDRATE 34 (H) 04/03/2018   ESRSEDRATE 25 06/28/2017   ESRSEDRATE 30 07/27/2016

## 2018-04-04 ENCOUNTER — Other Ambulatory Visit: Payer: Self-pay | Admitting: *Deleted

## 2018-04-04 MED ORDER — MONTELUKAST SODIUM 10 MG PO TABS: 10.0000 mg | ORAL_TABLET | Freq: Every day | ORAL | 11 refills | Status: DC

## 2018-04-04 NOTE — Progress Notes (Signed)
Rx singulair 10mg  sent in for pt as recommendation per MW due to eosinophil level.

## 2018-04-06 ENCOUNTER — Encounter: Payer: Self-pay | Admitting: Internal Medicine

## 2018-04-06 NOTE — Assessment & Plan Note (Signed)
Patient Saturations on Room Air at Rest = 97% Patient Saturations on [employer] Air while Ambulating = 87% Patient Saturations on 2 Liters of oxygen while Ambulating = 92% > rec 2lpm 24/7 esp since PA's are large on HRCT 06/22/17 - 04/03/2018   Walked RA  2 laps @ 185 ft each stopped due to  [place] GroupSob /desats on 2lpm, corrected on 3lpm    As of 04/03/2018 rec 2lpm 24/7 but increase to 3lpm walking

## 2018-04-06 NOTE — Assessment & Plan Note (Addendum)
-   hfa 75% 11/05/13 > rx dulera 100 2bid > changed to symbicort 160 2bid 11/25/13 > improved 09/29/2014 > ok to taper off as of 03/30/15 > flared off - Allergy profile 11/05/2013 >  Eos 6.1%  IgE 363 but RAST only pos to dust - NO 09/25/2015  = 31  - 09/25/2015    try off pm symbicort  - FENO 06/10/2016  =   75 on symb 80 2bid though hfa not optimal  - 07/27/2016  After extensive coaching HFA effectiveness =  75 % (Ti too short)  - Allergy profile 07/27/16  >  Eos 1.2 /  IgE  643 RAST pos dust > cockroach  - Daily pred rx 07/27/2016  -med calendar 08/30/2016   - 05/17/2017   continue symbicort 160 2bid   - 04/03/2018  After extensive coaching inhaler device  effectiveness =    75% > change back to symbicort 80 2bid as much of her cough appears to be uacs > asthma and on systemic steroids anyway (note Eos elevated at lower doses)   I had an extended discussion with the patient reviewing all relevant studies completed to date and  lasting 15 to 20 minutes of a 25 minute visit    See device teaching which extended face to face time for this visit.    Each maintenance medication was reviewed in detail including most importantly the difference between maintenance and as needed and under what circumstances the prns are to be used. This was done in the context of a medication calendar review which provided the patient with a user-friendly unambiguous mechanism for medication administration and reconciliation and provides an action plan for all active problems. It is critical that this be shown to every doctor  for modification during the office visit if necessary so the patient can use it as a working document.

## 2018-04-06 NOTE — Assessment & Plan Note (Addendum)
No evidnence of significant chf/ thryoid disorder/ anemia by today's labs though may have incipient cor pulmonale > encouraged to use prn hctz as written per action plan on med calendar

## 2018-04-06 NOTE — Assessment & Plan Note (Signed)
See cxr 09/18/13 - rec avoid all macrodantin exp 12/23/2013  - recurrent macrodantin exposure last filled rx  06/2014 [facility] >cxr much worse 07/16/14 with ESR 32 > started short course prednisone  - med calendar 08/01/2014  -CT chest 08/06/14 >Pulmonary parenchymal pattern of fibrosis, as described above, appears progressive when compared with chest radiographs dating back to 07/27/2009. 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  - 08/29/2014  Walked RA x 3 laps @ 185 ft each stopped due to  End of study mod pace, sats 91% - 09/29/2014  Walked RA x 3 laps @ 185 ft each stopped due to end of study sat 93% and fast pace   - 09/29/2014 PFTs  VC 1.70 (64%) s obst and dlco 57 corrects to 98% - 03/30/2015   PFTs  VC 1.98 (75%) s obst and dlco 66 corrects to 107  - PFT's  01/26/2016   VC 1.71 (65%) s obst   with DLCO  65/63 % corrects to 104  % for alv volume  - Daily pred rx 07/27/2016 for refractory cough /sob (PF vs airways)   - PFT's  12/26/2016   VC 1.65 (64%)  and no obst p  symb 160 prior to study with DLCO  46 % corrects to 84 % for alv volume  On pred 5 mg daily  - 12/26/2016  Walked RA x 3 laps @ 185 ft each stopped due to  End of study, nl pace, no sob or desat  - 03/28/2017   Walked RA  2 laps @ 185 ft each stopped due to  Sob with desat to 85% at fast pace on pred 10 mg per day p flaring off it   - 05/17/2017   Walked RA  2 laps @ 185 ft each stopped due to  Sob/ sats 93% at end   - HRCT 06/22/2017 1. Significant progression of fibrotic interstitial lung disease with mild bibasilar honeycombing. Stable chronic patchy air trapping in the lungs. This combination of findings raises suspicion of chronic hypersensitivity pneumonitis, although the fibrotic changes are dominant compared to the relatively mild air trapping, and the significant progression with basilar honeycombing is more compatible with usual interstitial pneumonia  (UIP). 2. Newly dilated main pulmonary artery, suggesting pulmonary arterial hypertension. - 06/28/2017 collagen vasc profile neg/ esr 25, HSP serology  >>> neg   Appears to be very prednisone dependent both in terms of symptoms and control of esr which we've been using to follow this problem.  The goal with a chronic steroid dependent illness is always arriving at the lowest effective dose that controls the disease/symptoms and not accepting a set "formula" which is based on statistics or guidelines that don't always take into account patient  variability or the natural hx of the dz in every individual patient, which may well vary over time.  For now therefore I recommend the patient maintain  Ceiling of 20 mg daily and floor of 10 mg

## 2018-04-24 ENCOUNTER — Other Ambulatory Visit: Payer: Self-pay | Admitting: Internal Medicine

## 2018-05-22 ENCOUNTER — Other Ambulatory Visit: Payer: Self-pay | Admitting: Family Medicine

## 2018-05-22 ENCOUNTER — Ambulatory Visit
Admission: RE | Admit: 2018-05-22 | Discharge: 2018-05-22 | Disposition: A | Discharge status: Home / Self Care | Payer: Medicare Other | Source: Ambulatory Visit | Attending: Family Medicine | Admitting: Family Medicine

## 2018-05-22 DIAGNOSIS — R0789 Other chest pain: Secondary | ICD-10-CM

## 2018-06-16 ENCOUNTER — Other Ambulatory Visit: Payer: Self-pay | Admitting: Internal Medicine

## 2018-07-05 ENCOUNTER — Ambulatory Visit: Payer: Medicare Other | Admitting: Internal Medicine

## 2018-07-05 ENCOUNTER — Ambulatory Visit (INDEPENDENT_AMBULATORY_CARE_PROVIDER_SITE_OTHER)
Admission: RE | Admit: 2018-07-05 | Discharge: 2018-07-05 | Disposition: A | Discharge status: Home / Self Care | Payer: Medicare Other | Source: Ambulatory Visit | Attending: Internal Medicine | Admitting: Internal Medicine

## 2018-07-05 ENCOUNTER — Telehealth: Payer: Self-pay | Admitting: Internal Medicine

## 2018-07-05 ENCOUNTER — Encounter: Payer: Self-pay | Admitting: Internal Medicine

## 2018-07-05 VITALS — BP 106/60 | HR 68 | Ht 62.0 in | Wt 155.0 lb

## 2018-07-05 DIAGNOSIS — J45991 Cough variant asthma: Secondary | ICD-10-CM

## 2018-07-05 DIAGNOSIS — J9611 Chronic respiratory failure with hypoxia: Secondary | ICD-10-CM

## 2018-07-05 DIAGNOSIS — R0789 Other chest pain: Secondary | ICD-10-CM

## 2018-07-05 DIAGNOSIS — J704 Drug-induced interstitial lung disorders, unspecified: Secondary | ICD-10-CM | POA: Diagnosis not present

## 2018-07-05 MED ORDER — PREDNISONE 10 MG PO TABS: ORAL_TABLET | ORAL | 0 refills | Status: DC

## 2018-07-05 MED ORDER — AZITHROMYCIN 250 MG PO TABS: ORAL_TABLET | ORAL | 0 refills | Status: DC

## 2018-07-05 MED ORDER — FLUTTER DEVI: 0 refills | Status: AC

## 2018-07-05 MED ORDER — FUROSEMIDE 40 MG PO TABS: 40.0000 mg | ORAL_TABLET | Freq: Every day | ORAL | 3 refills | Status: AC | PRN

## 2018-07-05 MED ORDER — FLUTTER DEVI: 0 refills | Status: DC

## 2018-07-05 NOTE — Patient Instructions (Addendum)
Change the hctz to 25 mg one tablet daily   As needed for leg swelling > furosemide 40 mg daily   zpak   Take mucinex dm up to 1200 mg  12 hours and use flutter valve as much as possible and supplement if needed with  tramadol 50 mg up to 2 every 4 hours to suppress the urge to cough. Swallowing water and/or using ice chips/non mint and menthol containing candies (such as lifesavers or sugarless jolly ranchers) are also effective.  You should rest your voice and avoid activities that you know make you cough.  Once you have eliminated the cough for 3 straight days try reducing the tramadol first,  then the delsym as tolerated.     Please remember to go to the  x-ray department downstairs in the basement  for your tests - we will call you with the results when they are available.      See Tammy NP w/in4 weeks with all your medications, even over the counter meds, separated in two separate bags, the ones you take no matter what vs the ones you stop once you feel better and take only as needed when you feel you need them.   Tammy  will generate for you a new user friendly medication calendar that will put [place] all on the same page re: your medication use.     Without this process, it simply isn't possible to assure that we are providing  your outpatient care  with  the attention to detail we feel you deserve.   If we cannot assure that you're getting that kind of care,  then we cannot manage your problem effectively from this clinic.  Once you have seen Tammy and we are sure that we're all on the same page with your medication use she will arrange follow up with me.

## 2018-07-05 NOTE — Progress Notes (Signed)
Spoke with pt and notified of results per Dr. Wert. Pt verbalized understanding and denied any questions. 

## 2018-07-05 NOTE — Progress Notes (Signed)
Subjective:    Patient ID: Casey Freeman, female    DOB: [birth year]    MRN: [id number]   Brief patient profile:  21  yowf  never smoker with h/o watery rhinitis/ cough x sev months typically in fall since around 2009 then new onset sob and cough intially dx as bronchitis late 2013  rx zpak and cough syrup and better but  Then newly  started on prn albuterol for wheezing rarely needed until Aug 2014  referred 10/04/2013 by Dr Tiburcio Pea for progressively worse  Wheeze/ cough  despite adding symbicort.  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    History of Present Illness  10/04/2013 1st [facility] office visit/ Casey Freeman cc daily sob/ cough x 4-5  m assoc with variable sob to point where has trouble sometimes with adls and even sometimes sob at rest if coughing assoc with hoarseness, dry cough and subwheeze day > night. rec Stop linsinopril  benicar 20/12.5 one daily  Try prilosec 20mg   Take 30-60 min before first meal of the day and Pepcid 20 mg one bedtime until cough is completely gone for at least a week without the need for cough suppression Best cough medication is delsym GERD diet  Prednisone 10 mg take  4 each am x 2 days,   2 each am x 2 days,  1 each am x 2 days and stop  Only use your albuterol as needed     07/27/2016  f/u ov/Casey Freeman re: persistent cough /sob in pt with PF s/p macrodantin and prob asthma on symb 59  Chief Complaint  Patient presents with  . Follow-up    Cough not improving at all. She is feeling more SOB. She was winded walking just lobby to exam room today.    sob across the room / cough is mostly clear day >> noct  Always better on prednisone but worse as tapers off >>pred 20mg  then hold 10mg  when better.      09/27/2016  f/u ov/Casey Freeman re: pf/ AB/pnds on maint rx with pred 10 mg daily and symb 160 2bid  Chief Complaint  Patient presents with  . Follow-up    4 weeks, still SOB and has a cough from  drainage, she does feel better   [facility] = can't walk a nl pace on a flat grade s sob but does fine slow and flat eg ok at [facility] Has med calendar and following  rec When doing better, reduce the prednisone to 10 mg one half daily - if doing worse, increase to 2 daily  Work on inhaler technique:  See calendar for specific medication instructions       03/28/2017  f/u ov/Casey Freeman re: PF / AB prednisone  10 mg daily  Chief Complaint  Patient presents with  . Follow-up    Pt states her breathing is unchanged, Pt states the heat is affecting her breathing, she has noticed she has needed her pro air more, she has been having increase sob with exertion, coughing with some thick mucus, She has a hard time producing mucus, she feels like it is stuck in her chest, Denies chest tightness,fever   much worse when stopped pred x 1 week  Better after 20 and then tapered to 10 mg daily for the last month prior to OV   Sleeps ok  Walks up to an hour tiw shopping s stopping albeit slow pace s 02 (though note walked very fast pace in  office)  rec For cough try mucinex dm up to 1200 mg as needed See calendar for specific medication instructions and bring it back for each and every office visit for every healthcare provider you see.   Separating the top medications from the bottom group is fundamental to providing you adequate care going forward.    05/17/2017  f/u ov/Casey Freeman re:  PF /cough variant asthma  Chief Complaint  Patient presents with  . Acute Visit    pt reports of increased sob and chest tightness with exertion, occ prod cough with clear mucus & occ wheezing.  increased pred to 20 mg daily from 10 mg daily baseline x 2 days prior to OV  And "no better"  proair helps breathing some but only using  Few times a day at most, never noct , and only if over does it  Legs weak several times a week stays that way all day and thinks it correlates with finger sats but they were proven inaccurate today  Cough no worse  than usual  rec Walk at slower pace Work on inhaler technique: Stay on 20 mg prednisone / day until better then try to taper to 5 mg  Daily  Please remember to go to the  x-ray department downstairs in the basement  for your tests - we will call you with the results when they are available. Keep appt in September but call sooner if needed  Add schedule hrct for return > ? HSP   06/28/2017  f/u ov/Casey Freeman re: weaned pred to 5 mg per day  Chief Complaint  Patient presents with  . Follow-up    Breathing is about the same "worse at times"- having trouble with minimal exertion such as folding clothes. She states that she is pleased with how well her cough is doing. She is using proair 2 x daily on average.   proair seems to help despite using symb 160 bid as maint but hfa not optimal  New oximeter x 3 weeks dropping with activity on 5 m pred per day  No pet/ bird/hay exposure  rec Please remember to go to the lab department downstairs in the basement  for your tests - we will call you with the results when they are available.  Prednisone 20 mg until better then taper down to 5 mg if tolerated and go back to previous if worse   Thgiving 2018 worse on 5 mg so increased to 10 mg daily    10/09/2017  f/u ov/Casey Freeman re:  PF steroid dep at 10 mg daily  Chief Complaint  Patient presents with  . Follow-up    Breathing is unchanged. She is using her albuterol inhaler 2-3 x per wk on average.    doe = MMRC3 = can't walk 100 yards even at a slow pace at a flat grade s stopping due to sob  Even on 02    Still taking delsym 2 tsp hs but sleeping well rec Use 02 continuous just while sleeping or walking  - other activities like sitting watching tv just pulse as needed to keep it over 90% Try symbicort 80 Take 2 puffs first thing in am and then another 2 puffs about 12 hours later.  Work on inhaler technique: No mint products !   01/02/2018  Acute extenend  ov/Casey Freeman re: acute cough  Chief Complaint   Patient presents with  . Acute Visit    Increased SOB x 1 wk. She states she started coughing up some  yellow sputum 2 days ago  She has been using her albuterol 2-3 x per wk on average.  Dyspnea:  Worse since 12/29/17  Cough: more cough/ mucus turned yellow  2 d prior to OV   Sleep: still sleeping ok  SABA use: no change  Increased prednisone to 20 mg 01/01/18  rec No change rx      04/03/2018  f/u ov/Casey Freeman re: pf / pred 10 mg daily  Chief Complaint  Patient presents with  . Follow-up    She states breathing has been worse over the past 2 wks.  She relates this to the humid weather. She is using her albuterol inhaler 1-2 x per day on average.   Dyspnea:  [employer] on 2lpm = MMRC3 = can't walk 100 yards even at a slow pace at a flat grade s stopping due to sob   Cough: worse one week prior to OV  Then better s pattern   Sleeping: about 10 degrees / 1 pillow and 2lpm - coughing spells going to bathroom but don't wake her up  SABA use: sev times a day  02:  2lpm but goes up to 3lpm when feels legs weak  rec Change symbicort to 80 Take 2 puffs first thing in am and then another 2 puffs about 12 hours later.  Only use your albuterol as a rescue medication  Be sure to avoid all mint Ok to use 3lpm with activity with goal to keep your saturations over 90% when exerting and pace yourself  Please remember to go to the lab department downstairs in the basement  for your tests - we will call you with the results when they are available. Follow med calendar as listed     Please schedule a follow up visit in 3 months but call sooner if needed     07/05/2018  f/u ov/Casey Freeman re: pf/ new L cp with coughing on pred = 10 mg daily  Chief Complaint  Patient presents with  . Follow-up    pt c/o prod cough with yellow to clear sputum and slight increase in SOB.   Dyspnea:  Stopped doing [employer] due to back pain  Cough: variable not worse using avg  tramadol 50 x 2 every 12 hours Sleeping: 10  degrees hob/  one pillow  SABA use: not needing  02: 3lpm  24/7 does not titrate  L cp parasternal upper location worse with cough/ min mucus  X 1 week   No obvious day to day or daytime variability or assoc   mucus plugs or hemoptysis or chest tightness, subjective wheeze or overt sinus or hb symptoms.   Sleeping as above  without nocturnal  or early am exacerbation  of respiratory  c/o's or need for noct saba. Also denies any obvious fluctuation of symptoms with weather or environmental changes or other aggravating or alleviating factors except as outlined above   No unusual exposure hx or h/o childhood pna/ asthma or knowledge of premature birth.  Current Allergies, Complete Past Medical History, Past Surgical History, Family History, and Social History were reviewed in [employer] record.  ROS  The following are not active complaints unless bolded Hoarseness, sore throat, dysphagia, dental problems, itching, sneezing,  nasal congestion or discharge of excess mucus or purulent secretions, ear ache,   fever, chills, sweats, unintended wt loss or wt gain, classically pleuritic or exertional cp,  orthopnea pnd or arm/hand swelling  or leg swelling finding needs hctz most  days, presyncope, palpitations, abdominal pain, anorexia, nausea, vomiting, diarrhea  or change in bowel habits or change in bladder habits, change in stools or change in urine, dysuria, hematuria,  rash, arthralgias, visual complaints, headache, numbness, weakness or ataxia or problems with walking or coordination,  change in mood or  memory.        Current Meds  Medication Sig  . acetaminophen (TYLENOL) 500 MG tablet Take per bottle as needed for pain  . albuterol (PROAIR HFA) 108 (90 Base) MCG/ACT inhaler Inhale 2 puffs into the lungs every 4 (four) hours as needed for wheezing or shortness of breath.  . Ascorbic Acid (VITAMIN C PO) Take 1 tablet by mouth daily.  . Azelastine HCl 0.15 % SOLN Place 1  spray into both nostrils at bedtime as needed.   . budesonide-formoterol (SYMBICORT) 80-4.5 MCG/ACT inhaler Inhale 2 puffs into the lungs 2 (two) times daily.  . chlorpheniramine (CHLOR-TRIMETON) 4 MG tablet Take 1 tablet by mouth every 4 hours as needed for drippy nose, drainage, and throat clearing  . dextromethorphan (DELSYM) 30 MG/5ML liquid 2 tsp every 12 hours as needed for cough  . famotidine (PEPCID) 20 MG tablet Take 20 mg by mouth at bedtime.   . hydrochlorothiazide (HYDRODIURIL) 25 MG tablet Take 25 mg by mouth daily as needed (leg swelling).   . LORazepam (ATIVAN) 0.5 MG tablet Take 0.5 mg by mouth 2 (two) times daily.   . Magnesium 250 MG TABS Take 1 tablet by mouth daily. Reported on 12/11/2015  . montelukast (SINGULAIR) 10 MG tablet Take 1 tablet (10 mg total) by mouth at bedtime.  . OXYGEN 3 lpm 24/7  [employer]  . pantoprazole (PROTONIX) 40 MG tablet TAKE 1 TABLET (40 MG TOTAL) BY MOUTH DAILY BEFORE BREAKFAST.  Marland Kitchen telmisartan (MICARDIS) 40 MG tablet Take 40 mg by mouth daily.  . traMADol (ULTRAM) 50 MG tablet Take 50-100 mg by mouth every 4 (four) hours as needed (if still coughing).   . TRAVATAN Z 0.004 % SOLN ophthalmic solution Apply 1 drop to eye at bedtime.  Marland Kitchen  ] predniSONE (DELTASONE) 10 MG tablet 2 daily until better then 1 daily                                Objective:   Physical Exam     hoarse amb wf congested cough  Vital signs reviewed - Note on arrival 02 sats  92% on 3lpm continuous     11/05/2013  184 > 11/25/2013   190 > 12/23/2013  190 > 07/16/2014 174  >172 08/01/2014 > 08/18/2014  171 >  09/29/2014 174 >  03/30/2015 178 > 06/14/2015  178 >  179 06/26/2015 > 09/25/2015 178 > 179 12/11/2015 > 01/26/2016   187 > 03/24/2016   182 >  06/10/2016   171 >  07/05/2016 > 07/27/2016 165 > 09/27/2016  174 > 12/26/2016  176 > 03/28/2017  177 > 05/17/2017   178 > 06/28/2017  173 > 10/09/2017   166  > 01/02/2018  169 > 04/03/2018  168 > 07/05/2018  155    HEENT: nl dentition,  turbinates bilaterally, and oropharynx. Nl external ear canals without cough reflex   NECK :  without JVD/Nodes/TM/ nl carotid upstrokes bilaterally   LUNGS: no acc muscle use,  Nl contour chest insp pops/ crackles  bilaterally without cough on insp or exp maneuvers   CV:  RRR  no s3 or  murmur or increase in P2, and trace to 1+ pitting despite elastic hose both LE's sym  ABD:  soft and nontender with nl inspiratory excursion in the supine position. No bruits or organomegaly appreciated, bowel sounds nl  MS:  Waddling gait/ ext warm without deformities, calf tenderness, cyanosis or clubbing No obvious joint restrictions   SKIN: warm and dry without lesions    NEURO:  alert, approp, nl sensorium with  no motor or cerebellar deficits apparent.          CXR PA and Lateral:   07/05/2018 :    I personally reviewed images and agree with radiology impression as follows:   No acute disease in patient with extensive pulmonary fibrosis.

## 2018-07-05 NOTE — Telephone Encounter (Signed)
Instructions from OV with MW: Change the hctz to 25 mg one tablet daily   As needed for leg swelling > furosemide 40 mg daily   zpak       Called and spoke with pt who stated Rx of furosemide 40mg  was not sent in after OV with MW. I verified pt's pharmacy and sent script in for pt. Nothing further needed.

## 2018-07-08 ENCOUNTER — Encounter: Payer: Self-pay | Admitting: Internal Medicine

## 2018-07-08 DIAGNOSIS — R0789 Other chest pain: Secondary | ICD-10-CM | POA: Insufficient documentation

## 2018-07-08 NOTE — Assessment & Plan Note (Signed)
Patient Saturations on Room Air at Rest = 97% Patient Saturations on [employer] while Ambulating = 87% Patient Saturations on 2 Liters of oxygen while Ambulating = 92% > rec 2lpm 24/7 esp since PA's are large on HRCT 06/22/17 - 04/03/2018   Walked RA  2 laps @ 185 ft each stopped due to  [employer] /desats on 2lpm, corrected on 3lpm    As of 07/05/2018 rec 3lpm 24/7 but increase if needed with ex to keep sats < 90%

## 2018-07-08 NOTE — Assessment & Plan Note (Signed)
See cxr 09/18/13 - rec avoid all macrodantin exp 12/23/2013  - recurrent macrodantin exposure last filled rx  [facility] >cxr much worse 07/16/14 with ESR 32 > started short course prednisone  - med calendar 08/01/2014  -CT chest 08/06/14 >Pulmonary parenchymal pattern of fibrosis, as described above, appears progressive when compared with chest radiographs dating back to 07/27/2009. 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  - 08/29/2014  Walked RA x 3 laps @ 185 ft each stopped due to  End of study mod pace, sats 91% - 09/29/2014  Walked RA x 3 laps @ 185 ft each stopped due to end of study sat 93% and fast pace   - 09/29/2014 PFTs  VC 1.70 (64%) s obst and dlco 57 corrects to 98% - 03/30/2015   PFTs  VC 1.98 (75%) s obst and dlco 66 corrects to 107  - PFT's  01/26/2016   VC 1.71 (65%) s obst   with DLCO  65/63 % corrects to 104  % for alv volume  - Daily pred rx 07/27/2016 for refractory cough /sob (PF vs airways)   - PFT's  12/26/2016   VC 1.65 (64%)  and no obst p  symb 160 prior to study with DLCO  46 % corrects to 84 % for alv volume  On pred 5 mg daily  - 12/26/2016  Walked RA x 3 laps @ 185 ft each stopped due to  End of study, nl pace, no sob or desat  - 03/28/2017   Walked RA  2 laps @ 185 ft each stopped due to  Sob with desat to 85% at fast pace on pred 10 mg per day p flaring off it   - 05/17/2017   Walked RA  2 laps @ 185 ft each stopped due to  Sob/ sats 93% at end   - HRCT 06/22/2017 1. Significant progression of fibrotic interstitial lung disease with mild bibasilar honeycombing. Stable chronic patchy air trapping in the lungs. This combination of findings raises suspicion of chronic hypersensitivity pneumonitis, although the fibrotic changes are dominant compared to the relatively mild air trapping, and the significant progression with basilar honeycombing is more compatible with usual interstitial pneumonia  (UIP). 2. Newly dilated main pulmonary artery, suggesting pulmonary arterial hypertension. - 06/28/2017 collagen vasc profile neg/ esr 25, HSP serology  >>> neg     The goal with a chronic steroid dependent illness is always arriving at the lowest effective dose that controls the disease/symptoms and not accepting a set "formula" which is based on statistics or guidelines that don't always take into account patient  variability or the natural hx of the dz in every individual patient, which may well vary over time.  For now therefore I recommend the patient maintain  20 mg until better then 10 mg floor

## 2018-07-08 NOTE — Assessment & Plan Note (Addendum)
Likely from severe cough   See cough suppression recs    I had an extended discussion with the patient reviewing all relevant studies completed to date and  lasting 15 to 20 minutes of a 25 minute visit    See device teaching which extended face to face time for this visit (hfa/ flutter)  Each maintenance medication was reviewed in detail including most importantly the difference between maintenance and prns and under what circumstances the prns are to be triggered using an action plan format that is not reflected in the computer generated alphabetically organized AVS.     Please see AVS for specific instructions unique to this visit that I personally wrote and verbalized to the the pt in detail and then reviewed with pt  by my nurse highlighting any  changes in therapy recommended at today's visit to their plan of care.

## 2018-07-08 NOTE — Assessment & Plan Note (Signed)
-   hfa 75% 11/05/13 > rx dulera 100 2bid > changed to symbicort 160 2bid 11/25/13 > improved 09/29/2014 > ok to taper off as of 03/30/15 > flared off - Allergy profile 11/05/2013 >  Eos 6.1%  IgE 363 but RAST only pos to dust - NO 09/25/2015  = 31  - 09/25/2015    try off pm symbicort  - FENO 06/10/2016  =   75 on symb 80 2bid though hfa not optimal  - 07/27/2016  After extensive coaching HFA effectiveness =  75 % (Ti too short)  - Allergy profile 07/27/16  >  Eos 1.2 /  IgE  643 RAST pos dust > cockroach  - Daily pred rx 07/27/2016  -med calendar 08/30/2016  - 05/17/2017   continue symbicort 160 2bid - 04/03/2018  After extensive coaching inhaler device  effectiveness =    75% > change back to symbicort 80 2bid as much of her cough appears to be uacs > asthma and on systemic steroids anyway (note Eos elevated at lower doses)   - 07/05/2018  After extensive coaching inhaler device,  effectiveness =  75% (short Ti    - flutter added 07/05/2018

## 2018-07-28 ENCOUNTER — Other Ambulatory Visit: Payer: Self-pay | Admitting: Internal Medicine

## 2018-07-28 DIAGNOSIS — J45991 Cough variant asthma: Secondary | ICD-10-CM

## 2018-08-02 ENCOUNTER — Ambulatory Visit (INDEPENDENT_AMBULATORY_CARE_PROVIDER_SITE_OTHER): Payer: Medicare Other | Admitting: Adult Health

## 2018-08-02 ENCOUNTER — Encounter: Payer: Self-pay | Admitting: Adult Health

## 2018-08-02 DIAGNOSIS — J9611 Chronic respiratory failure with hypoxia: Secondary | ICD-10-CM | POA: Diagnosis not present

## 2018-08-02 DIAGNOSIS — J45991 Cough variant asthma: Secondary | ICD-10-CM

## 2018-08-02 DIAGNOSIS — I10 Essential (primary) hypertension: Secondary | ICD-10-CM

## 2018-08-02 DIAGNOSIS — J704 Drug-induced interstitial lung disorders, unspecified: Secondary | ICD-10-CM

## 2018-08-02 NOTE — Patient Instructions (Addendum)
Continue on Oxygen 3l/m rest and 4/m with activity .  Follow med calendar closely and bring to each visit.  May stop Singulair  Follow up with Dr. Tiburcio Pea for blood pressure .  Follow up with Dr. Sherene Sires or Lama Narayanan NP  In 2 months and As needed   Please contact office for sooner follow up if symptoms do not improve or worsen or seek emergency care

## 2018-08-02 NOTE — Assessment & Plan Note (Signed)
B/p running low per pt , has held benicar Advised to discuss with PCP  Cont on HCTZ .

## 2018-08-02 NOTE — Progress Notes (Signed)
_0  ID: Casey Freeman, female    DOB: 1936/03/07, 82 y.o.   MRN: [id number]  Chief Complaint  Patient presents with  . Follow-up    Pulmonary Fibrosis     Referring provider: Shirline Frees, MD  HPI: 82 yo female never smoker seen for initial pulmonary consult 10/2013 for dyspnea and cough found to have PF s/p macrodantin (stopped 12/2013) And cough variant asthma   TEST CT chest 08/06/14 >Pulmonary parenchymal pattern of fibrosis, as described above, appears progressive when compared with chest radiographs dating back to 07/27/2009. 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   08/29/2014 Walked RA x 3 laps @ 185 ft each stopped due to End of study mod pace, sats 91%ce  - 09/29/2014 PFTs VC 1.70 (64%) s obst and dlco 57 corrects to 98%  - 03/30/2015 PFTs VC 1.98 (75%) s obst and dlco 66 corrects to 107   - PFT's 01/26/2016 VC 1.71 (65%) s obst with DLCO 65/63 % corrects to 104 % for alv volume   - Daily pred rx 07/27/2016 for refractory cough /sob (PF vs airways)  -I0/2017 >IgE 643, Eosinophils 11%, 1200 .  - PFT's 12/26/2016 VC 1.65 (64%) and no obst p symb 160 prior to study with DLCO 46 % corrects to 84 % for alv volume On pred 5 mg daily   - 12/26/2016 Walked RA x 3 laps @ 185 ft each stopped due to End of study, nl pace, no sob or desat   - 03/28/2017 Walked RA 2 laps @ 185 ft each stopped due to Sob with desat to 85% at fast pace on pred 10 mg per day p flaring off it   - 05/17/2017 Walked RA 2 laps @ 185 ft each stopped due to Sob/ sats 93% at end   08/2017 IgE 643>133 , Eosinophils 1200>300 . (on prednisone 88m )   - HRCT 06/22/2017 1. Significant progression of fibrotic interstitial lung disease with mild bibasilar honeycombing. Stable chronic patchy air trapping in the lungs. This combination of findings raises suspicion of chronic  hypersensitivity pneumonitis, although the fibrotic changes are dominant compared to the relatively mild air trapping, and the significant progression with basilar honeycombing is more compatible with usual interstitial pneumonia (UIP). 2. Newly dilated main pulmonary artery, suggesting pulmonary arterial hypertension.  - 06/28/2017 collagen vasc profile neg/ esr 25, HSP serology >>>neg  08/2017 IgE 643>133 , Eosinophils 1200>300 . 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  Patient was seen last visit with a asthmatic bronchitic flare.  She was given a Z-Pak.  Patient says she is had some improvement.  Over the last few days has been having some good days.  She continues to get very short of breath with minimum activity.  Has a daily chronic cough.  She remains on prednisone 10 mg daily.  She denies any fever, discolored mucus chest pain orthopnea.  Patient did have increased leg swelling last visit.  She was recommended to take Hydrocort thiazide 25 mg.  And to use Lasix as needed.  Patient says she did not need to take any extra Lasix.  Leg swelling has been improved. Remains  on oxygen 3 L with rest and 4 L with activity She is under a lot of stress.  Her husband has dementia she is the caregiver..  We had a long discussion regarding end-of-life.  She  has a living well.  She says she is a DNR.  Has papers at home.  Family is aware of her wishes.  We reviewed all her medications organize them into a medication calendar.  Appears to be taking her medications correctly.  She had several changes.  She says she is still taking Singulair but not sure that she needs to take this.  She does not have much allergy symptoms.  Also has not been taking her Benicar.  Her blood pressure has been running low.  When she takes it it makes it too low.  Her weight does slowly  continue to trend down.  She is down 20 pounds over the last year.  She says she is eating and is started using Ensure shakes between meals.  Chest x-ray last visit showed chronic fibrosis without acute process.  Allergies  Allergen Reactions  . Doxycycline Other (See Comments)    Sore throat   . Hydrocodone     Makes her dizzy  . Macrodantin [Nitrofurantoin Macrocrystal]     Lung Dz  . Simvastatin Rash    Immunization History  Administered Date(s) Administered  . Influenza Split 07/03/2013, 07/03/2014, 07/04/2015  . Influenza, High Dose Seasonal PF 06/12/2017, 06/18/2018  . Influenza,inj,Quad PF,6+ Mos 06/07/2016  . Pneumococcal Conjugate-13 05/01/2014  . Pneumococcal Polysaccharide-23 08/09/2017    Past Medical History:  Diagnosis Date  . Arthritis   . Hx: UTI (urinary tract infection)   . Hypertension   . PONV (postoperative nausea and vomiting)    >20 YRS AGO - NO PROBLEM SINCE    Tobacco History: Social History   Tobacco Use  Smoking Status Never Smoker  Smokeless Tobacco Never Used   Counseling given: Not Answered   Outpatient Medications Prior to Visit  Medication Sig Dispense Refill  . acetaminophen (TYLENOL) 500 MG tablet Take per bottle as needed for pain    . albuterol (PROAIR HFA) 108 (90 Base) MCG/ACT inhaler Inhale 2 puffs into the lungs every 4 (four) hours as needed for wheezing or shortness of breath. 1 Inhaler 5  . Ascorbic Acid (VITAMIN C PO) Take 1 tablet by mouth daily.    . Azelastine HCl 0.15 % SOLN Place 1 spray into both nostrils at bedtime as needed.     Marland Kitchen azithromycin (ZITHROMAX) 250 MG tablet Take 2 on day one then 1 daily x 4 days 6 tablet 0  . budesonide-formoterol (SYMBICORT) 80-4.5 MCG/ACT inhaler Inhale 2 puffs into the lungs 2 (two) times daily. 1 Inhaler 11  . chlorpheniramine (CHLOR-TRIMETON) 4 MG tablet Take 1 tablet by mouth every 4 hours as needed for drippy nose, drainage, and throat clearing    . dextromethorphan (DELSYM) 30  MG/5ML liquid 2 tsp every 12 hours as needed for cough    . famotidine (PEPCID) 20 MG tablet Take 20 mg by mouth at bedtime.     . furosemide (LASIX) 40 MG tablet Take 1 tablet (40 mg total) by mouth daily as needed for edema. 30 tablet 3  . hydrochlorothiazide (HYDRODIURIL) 25 MG tablet Take 25 mg by mouth daily as needed (leg swelling).     . LORazepam (ATIVAN) 0.5 MG tablet Take 0.5 mg by mouth 2 (two) times daily.     . Magnesium 250 MG TABS Take 1 tablet by mouth daily. Reported on 12/11/2015    . montelukast (SINGULAIR) 10 MG tablet Take 1 tablet (10 mg total) by mouth at bedtime. 30 tablet 11  . OXYGEN 3 lpm 24/7  [facility]    .  pantoprazole (PROTONIX) 40 MG tablet TAKE 1 TABLET (40 MG TOTAL) BY MOUTH DAILY BEFORE BREAKFAST. 30 tablet 2  . predniSONE (DELTASONE) 10 MG tablet TAKE AS DIRECTED 100 tablet 1  . Respiratory Therapy Supplies (FLUTTER) DEVI Use as directed 1 each 0  . telmisartan (MICARDIS) 40 MG tablet Take 40 mg by mouth daily.    . traMADol (ULTRAM) 50 MG tablet Take 50-100 mg by mouth every 4 (four) hours as needed (if still coughing).     . TRAVATAN Z 0.004 % SOLN ophthalmic solution Apply 1 drop to eye at bedtime.  4   No facility-administered medications prior to visit.      Review of Systems  Constitutional:   No  weight loss, night sweats,  Fevers, chills,  +fatigue, or  lassitude.  HEENT:   No headaches,  Difficulty swallowing,  Tooth/dental problems, or  Sore throat,                No sneezing, itching, ear ache, nasal congestion, post nasal drip,   CV:  No chest pain,  Orthopnea, PND, swelling in lower extremities, anasarca, dizziness, palpitations, syncope.   GI  No heartburn, indigestion, abdominal pain, nausea, vomiting, diarrhea, change in bowel habits, loss of appetite, bloody stools.   Resp:  .  No chest wall deformity  Skin: no rash or lesions.  GU: no dysuria, change in color of urine, no urgency or frequency.  No flank pain, no hematuria   MS:   No joint pain or swelling.  No decreased range of motion.  No back pain.    Physical Exam  BP 118/70 (BP Location: Left Arm, Cuff Size: Normal)   Pulse 89   Ht _0  (1.575 m)   Wt 149 lb 3.2 oz (67.7 kg)   SpO2 93%   BMI 27.29 kg/m   GEN: A/Ox3; pleasant , NAD, chronically ill-appearing on oxygen   HEENT:  [place] M. [place]/AT,  EACs-clear, TMs-wnl, NOSE-clear, THROAT-clear, no lesions, no postnasal drip or exudate noted.   NECK:  Supple w/ fair ROM; no JVD; normal carotid impulses w/o bruits; no thyromegaly or nodules palpated; no lymphadenopathy.    RESP bibasilar crackles  no accessory muscle use, no dullness to percussion  CARD:  RRR, no m/r/g, trace peripheral edema, pulses intact, no cyanosis or clubbing.  GI:   Soft & nt; nml bowel sounds; no organomegaly or masses detected.   Musco: Warm bil, no deformities or joint swelling noted.   Neuro: alert, no focal deficits noted.    Skin: Warm, no lesions or rashes    Lab Results:  CBC    Component Value Date/Time   WBC 10.3 04/03/2018 0926   RBC 4.25 04/03/2018 0926   HGB 13.5 04/03/2018 0926   HCT 40.1 04/03/2018 0926   PLT 256.0 04/03/2018 0926   MCV 94.5 04/03/2018 0926   MCH 30.5 05/04/2012 0402   MCHC 33.7 04/03/2018 0926   RDW 13.8 04/03/2018 0926   LYMPHSABS 2.3 04/03/2018 0926   MONOABS 0.8 04/03/2018 0926   EOSABS 0.7 04/03/2018 0926   BASOSABS 0.1 04/03/2018 0926    BMET    Component Value Date/Time   NA 140 04/03/2018 0926   K 3.5 04/03/2018 0926   CL 99 04/03/2018 0926   CO2 32 04/03/2018 0926   GLUCOSE 119 (H) 04/03/2018 0926   BUN 19 04/03/2018 0926   CREATININE 0.70 04/03/2018 0926   CALCIUM 9.3 04/03/2018 0926   GFRNONAA 89 (L) 05/03/2012 0355   GFRAA >90  05/03/2012 0355    BNP No results found for: BNP  ProBNP    Component Value Date/Time   PROBNP 120.0 (H) 04/03/2018 0926    Imaging: Dg Chest 2 View  Result Date: 07/05/2018 CLINICAL DATA:  Cough and left upper chest pain for 1-2  weeks. EXAM: CHEST - 2 VIEW COMPARISON:  PA and lateral chest 01/02/2018 and 05/17/2017. CT chest 06/22/2017. FINDINGS: Extensive bilateral pulmonary fibrosis is unchanged in appearance. No new airspace disease. Heart size is normal. No pneumothorax or pleural fluid. No acute or focal bony abnormality. IMPRESSION: No acute disease in patient with extensive pulmonary fibrosis. Electronically Signed   By: Inge Rise M.D.   On: 07/05/2018 10:10      PFT Results Latest Ref Rng & Units 12/26/2016 01/26/2016 03/30/2015 09/29/2014 04/28/2014  FVC-Pre L 1.45 1.67 1.72 1.64 1.65  FVC-Predicted Pre % 56 64 65 61 62  FVC-Post L 1.51 1.61 1.70 1.64 1.55  FVC-Predicted Post % 59 62 64 62 58  Pre FEV1/FVC % % 89 88 93 92 85  Post FEV1/FCV % % 90 89 90 83 91  FEV1-Pre L 1.29 1.47 1.61 1.51 1.41  FEV1-Predicted Pre % 67 76 81 76 70  DLCO UNC% % 46 65 66 57 47  DLCO COR %Predicted % 84 104 107 98 91  TLC L 2.74 3.27 3.08 3.05 4.00  TLC % Predicted % 56 66 62 62 81  RV % Predicted % 47 68 48 59 97    Lab Results  Component Value Date   NITRICOXIDE 36 08/30/2016        Assessment & Plan:   Cough variant asthma Recent flare with bronchitis now improved. Patient says she does not notice that Singulair is helping.  To simplify her medication regimen we will stop this for now.  If symptoms flare.  She does have elevated eosinophils.  It seems to be responsive to steroids and she is on these chronically.  Plan  Patient Instructions  Continue on Oxygen 3l/m rest and 4/m with activity .  Follow med calendar closely and bring to each visit.  May stop Singulair  Follow up with Dr. Kenton Kingfisher for blood pressure .  Follow up with Dr. Melvyn Novas or Lautaro Koral NP  In 2 months and As needed   Please contact office for sooner follow up if symptoms do not improve or worsen or seek emergency care        Drug-induced diffuse interstitial pulmonary fibrosis ([place]) Appears stable.  Patient has significant symptom  burden with activity.  At rest she does not have significant symptoms.  Have advised her to use activity as tolerated.  Try to keep her muscle tone is much as possible.  For now continue on her current regimen with prednisone 10 mg daily.  Patient's medications were reviewed today and patient education was given. Computerized medication calendar was adjusted/completed   Plan  . Patient Instructions  Continue on Oxygen 3l/m rest and 4/m with activity .  Follow med calendar closely and bring to each visit.  May stop Singulair  Follow up with Dr. Kenton Kingfisher for blood pressure .  Follow up with Dr. Melvyn Novas or Keonta Monceaux NP  In 2 months and As needed   Please contact office for sooner follow up if symptoms do not improve or worsen or seek emergency care       Chronic respiratory failure with hypoxia ([place]) Continue on Oxygen   Essential hypertension B/p running low per pt , has held benicar  Advised to discuss with PCP  Cont on HCTZ .      Rexene Edison, NP 08/02/2018

## 2018-08-02 NOTE — Assessment & Plan Note (Signed)
Recent flare with bronchitis now improved. Patient says she does not notice that Singulair is helping.  To simplify her medication regimen we will stop this for now.  If symptoms flare.  She does have elevated eosinophils.  It seems to be responsive to steroids and she is on these chronically.  Plan  Patient Instructions  Continue on Oxygen 3l/m rest and 4/m with activity .  Follow med calendar closely and bring to each visit.  May stop Singulair  Follow up with Dr. Tiburcio Pea for blood pressure .  Follow up with Dr. Sherene Sires or Joshua Zeringue NP  In 2 months and As needed   Please contact office for sooner follow up if symptoms do not improve or worsen or seek emergency care

## 2018-08-02 NOTE — Progress Notes (Signed)
Chart and office note reviewed in detail  > agree with a/p as outlined    

## 2018-08-02 NOTE — Assessment & Plan Note (Signed)
Appears stable.  Patient has significant symptom burden with activity.  At rest she does not have significant symptoms.  Have advised her to use activity as tolerated.  Try to keep her muscle tone is much as possible.  For now continue on her current regimen with prednisone 10 mg daily.  Patient's medications were reviewed today and patient education was given. Computerized medication calendar was adjusted/completed   Plan  . Patient Instructions  Continue on Oxygen 3l/m rest and 4/m with activity .  Follow med calendar closely and bring to each visit.  May stop Singulair  Follow up with Dr. Tiburcio Pea for blood pressure .  Follow up with Dr. Sherene Sires or [place] NP  In 2 months and As needed   Please contact office for sooner follow up if symptoms do not improve or worsen or seek emergency care

## 2018-08-02 NOTE — Assessment & Plan Note (Signed)
Continue on Oxygen   

## 2018-08-08 ENCOUNTER — Telehealth: Payer: Self-pay | Admitting: Internal Medicine

## 2018-08-08 NOTE — Telephone Encounter (Signed)
OV notes have been faxed to [facility] with [employer]. Nothing further was needed.

## 2018-08-10 NOTE — Addendum Note (Signed)
Addended by: Boone Master E on: 08/10/2018 10:54 AM   Modules accepted: Orders

## 2018-08-31 ENCOUNTER — Other Ambulatory Visit: Payer: Self-pay | Admitting: Internal Medicine

## 2018-08-31 MED ORDER — PANTOPRAZOLE SODIUM 40 MG PO TBEC: 40.0000 mg | DELAYED_RELEASE_TABLET | Freq: Every day | ORAL | 5 refills | Status: AC

## 2018-09-13 ENCOUNTER — Telehealth: Payer: Self-pay | Admitting: Internal Medicine

## 2018-09-13 NOTE — Telephone Encounter (Signed)
Called Darlene, unable to reach. Left message to give us a call back.

## 2018-09-13 NOTE — Telephone Encounter (Signed)
Agustin CreeDarlene is calling back requesting orders. CB# [phone] fax [phone]

## 2018-09-13 NOTE — Telephone Encounter (Signed)
Spoke with Casey Freeman, she requested orders for pt medication. I sent a letter with a list of her medications to the facility and attn: Darlene. Nothing further is needed.

## 2018-09-14 ENCOUNTER — Telehealth: Payer: Self-pay | Admitting: Internal Medicine

## 2018-09-14 NOTE — Telephone Encounter (Signed)
[facility] [phone]: pt medication (chlorpheniramine ) needs a dispense # and refill #..Marland Kitchen

## 2018-09-14 NOTE — Telephone Encounter (Signed)
Called to speak with Burna MortimerWanda. Spoke with Dottie who is the resident Restaurant manager, fast foodcare director. When I stated to her the FL-2 form, I stated to her that I did not know what that form was and per Dottie, the FL-2 form is the form that is required for people to be able to stay in assisted living.  Dottie stated to me they did receive a form from our office yesterday, 12/12 that had meds on it as well as diagnosis on the form. Dottie stated to me if anything else was needed, they would call our office back. Nothing further needed.

## 2018-09-14 NOTE — Telephone Encounter (Signed)
[place]ed [place] [employer] [facility]	[place] [facility].  He needed clarification for lasix and chlorpheniramine, because the fax didn't show after dosage.  Read off dosage and instructions from current med list.  Nothing further at this time.

## 2018-09-14 NOTE — Telephone Encounter (Signed)
Per Tamara's message 12/12: Spoke with Agustin Creearlene, she requested orders for pt medication. I sent a letter with a list of her medications to the facility and attn: Darlene. Nothing further is needed.   Per the chlorpheniramine 4mg  tablet, instructions is for pt to take 1-2 tablets by mouth every 4 hours as needed for drippy nose, drainage, and throat clearing.  Called [facility] and spoke with Burna MortimerWanda to clarify if they were still needing orders to be faxed. Per Burna MortimerWanda, they did receive the fax of the orders.nothing further needed.

## 2018-09-22 ENCOUNTER — Emergency Department (HOSPITAL_COMMUNITY): Payer: Medicare Other

## 2018-09-22 ENCOUNTER — Encounter (HOSPITAL_COMMUNITY): Payer: Self-pay | Admitting: Nurse Practitioner

## 2018-09-22 ENCOUNTER — Inpatient Hospital Stay (HOSPITAL_COMMUNITY)
Admission: EM | Admit: 2018-09-22 | Discharge: 2018-10-01 | DRG: 205 | Disposition: A | Discharge status: Hospice | Payer: Medicare Other | Attending: Family Medicine | Admitting: Family Medicine

## 2018-09-22 DIAGNOSIS — Z79899 Other long term (current) drug therapy: Secondary | ICD-10-CM | POA: Diagnosis not present

## 2018-09-22 DIAGNOSIS — J704 Drug-induced interstitial lung disorders, unspecified: Secondary | ICD-10-CM | POA: Diagnosis not present

## 2018-09-22 DIAGNOSIS — R0789 Other chest pain: Secondary | ICD-10-CM | POA: Diagnosis present

## 2018-09-22 DIAGNOSIS — J703 Chronic drug-induced interstitial lung disorders: Principal | ICD-10-CM | POA: Diagnosis present

## 2018-09-22 DIAGNOSIS — Z66 Do not resuscitate: Secondary | ICD-10-CM | POA: Diagnosis present

## 2018-09-22 DIAGNOSIS — Z515 Encounter for palliative care: Secondary | ICD-10-CM | POA: Diagnosis present

## 2018-09-22 DIAGNOSIS — J9611 Chronic respiratory failure with hypoxia: Secondary | ICD-10-CM | POA: Diagnosis not present

## 2018-09-22 DIAGNOSIS — R059 Cough, unspecified: Secondary | ICD-10-CM | POA: Diagnosis present

## 2018-09-22 DIAGNOSIS — Z90710 Acquired absence of both cervix and uterus: Secondary | ICD-10-CM

## 2018-09-22 DIAGNOSIS — Z9981 Dependence on supplemental oxygen: Secondary | ICD-10-CM

## 2018-09-22 DIAGNOSIS — I10 Essential (primary) hypertension: Secondary | ICD-10-CM | POA: Diagnosis present

## 2018-09-22 DIAGNOSIS — Z96651 Presence of right artificial knee joint: Secondary | ICD-10-CM | POA: Diagnosis present

## 2018-09-22 DIAGNOSIS — I480 Paroxysmal atrial fibrillation: Secondary | ICD-10-CM | POA: Diagnosis present

## 2018-09-22 DIAGNOSIS — Z7189 Other specified counseling: Secondary | ICD-10-CM | POA: Diagnosis not present

## 2018-09-22 DIAGNOSIS — I361 Nonrheumatic tricuspid (valve) insufficiency: Secondary | ICD-10-CM | POA: Diagnosis not present

## 2018-09-22 DIAGNOSIS — J9621 Acute and chronic respiratory failure with hypoxia: Secondary | ICD-10-CM | POA: Diagnosis present

## 2018-09-22 DIAGNOSIS — I4891 Unspecified atrial fibrillation: Secondary | ICD-10-CM

## 2018-09-22 DIAGNOSIS — I340 Nonrheumatic mitral (valve) insufficiency: Secondary | ICD-10-CM | POA: Diagnosis not present

## 2018-09-22 DIAGNOSIS — J8410 Pulmonary fibrosis, unspecified: Secondary | ICD-10-CM

## 2018-09-22 DIAGNOSIS — J309 Allergic rhinitis, unspecified: Secondary | ICD-10-CM | POA: Diagnosis present

## 2018-09-22 DIAGNOSIS — H409 Unspecified glaucoma: Secondary | ICD-10-CM | POA: Diagnosis present

## 2018-09-22 DIAGNOSIS — R0602 Shortness of breath: Secondary | ICD-10-CM

## 2018-09-22 DIAGNOSIS — R0603 Acute respiratory distress: Secondary | ICD-10-CM | POA: Diagnosis not present

## 2018-09-22 DIAGNOSIS — F419 Anxiety disorder, unspecified: Secondary | ICD-10-CM | POA: Diagnosis present

## 2018-09-22 DIAGNOSIS — M1710 Unilateral primary osteoarthritis, unspecified knee: Secondary | ICD-10-CM | POA: Diagnosis present

## 2018-09-22 DIAGNOSIS — Z87440 Personal history of urinary (tract) infections: Secondary | ICD-10-CM

## 2018-09-22 DIAGNOSIS — R05 Cough: Secondary | ICD-10-CM | POA: Diagnosis present

## 2018-09-22 DIAGNOSIS — J9601 Acute respiratory failure with hypoxia: Secondary | ICD-10-CM

## 2018-09-22 DIAGNOSIS — I370 Nonrheumatic pulmonary valve stenosis: Secondary | ICD-10-CM | POA: Diagnosis not present

## 2018-09-22 DIAGNOSIS — M179 Osteoarthritis of knee, unspecified: Secondary | ICD-10-CM | POA: Diagnosis present

## 2018-09-22 DIAGNOSIS — J84112 Idiopathic pulmonary fibrosis: Secondary | ICD-10-CM | POA: Diagnosis not present

## 2018-09-22 DIAGNOSIS — R0600 Dyspnea, unspecified: Secondary | ICD-10-CM | POA: Diagnosis present

## 2018-09-22 DIAGNOSIS — G8929 Other chronic pain: Secondary | ICD-10-CM | POA: Diagnosis present

## 2018-09-22 DIAGNOSIS — J189 Pneumonia, unspecified organism: Secondary | ICD-10-CM

## 2018-09-22 HISTORY — DX: Drug-induced interstitial lung disorders, unspecified: J70.4

## 2018-09-22 LAB — CBC WITH DIFFERENTIAL/PLATELET
ABS IMMATURE GRANULOCYTES: 0.07 10*3/uL (ref 0.00–0.07)
Basophils Absolute: 0.1 10*3/uL (ref 0.0–0.1)
Basophils Relative: 1 %
Eosinophils Absolute: 0.5 10*3/uL (ref 0.0–0.5)
Eosinophils Relative: 4 %
HCT: 42.6 % (ref 36.0–46.0)
Hemoglobin: 13.3 g/dL (ref 12.0–15.0)
IMMATURE GRANULOCYTES: 1 %
Lymphocytes Relative: 11 %
Lymphs Abs: 1.5 10*3/uL (ref 0.7–4.0)
MCH: 30.9 pg (ref 26.0–34.0)
MCHC: 31.2 g/dL (ref 30.0–36.0)
MCV: 98.8 fL (ref 80.0–100.0)
Monocytes Absolute: 0.8 10*3/uL (ref 0.1–1.0)
Monocytes Relative: 6 %
NEUTROS PCT: 77 %
NRBC: 0 % (ref 0.0–0.2)
Neutro Abs: 10.8 10*3/uL — ABNORMAL HIGH (ref 1.7–7.7)
Platelets: 245 10*3/uL (ref 150–400)
RBC: 4.31 MIL/uL (ref 3.87–5.11)
RDW: 12.8 % (ref 11.5–15.5)
WBC: 13.8 10*3/uL — ABNORMAL HIGH (ref 4.0–10.5)

## 2018-09-22 LAB — BASIC METABOLIC PANEL
Anion gap: 11 (ref 5–15)
BUN: 23 mg/dL (ref 8–23)
CO2: 24 mmol/L (ref 22–32)
Calcium: 8.9 mg/dL (ref 8.9–10.3)
Chloride: 103 mmol/L (ref 98–111)
Creatinine, Ser: 0.74 mg/dL (ref 0.44–1.00)
GFR calc non Af Amer: >60 mL/min (ref >60)
Glucose, Bld: 133 mg/dL — ABNORMAL HIGH (ref 70–99)
Potassium: 4.1 mmol/L (ref 3.5–5.1)
Sodium: 138 mmol/L (ref 135–145)

## 2018-09-22 LAB — I-STAT CHEM 8, ED
BUN: 22 mg/dL (ref 8–23)
Calcium, Ion: 1.14 mmol/L — ABNORMAL LOW (ref 1.15–1.40)
Chloride: 103 mmol/L (ref 98–111)
Creatinine, Ser: 0.7 mg/dL (ref 0.44–1.00)
Glucose, Bld: 134 mg/dL — ABNORMAL HIGH (ref 70–99)
HCT: 41 % (ref 36.0–46.0)
Hemoglobin: 13.9 g/dL (ref 12.0–15.0)
Potassium: 4 mmol/L (ref 3.5–5.1)
Sodium: 137 mmol/L (ref 135–145)
TCO2: 27 mmol/L (ref 22–32)

## 2018-09-22 LAB — I-STAT TROPONIN, ED: Troponin i, poc: 0.05 ng/mL (ref 0.00–0.08)

## 2018-09-22 MED ORDER — SODIUM CHLORIDE 0.9 % IV SOLN
1.0000 g | INTRAVENOUS | Status: DC
  Administered 2018-09-23 – 2018-09-28 (×6): 1 g via INTRAVENOUS
  Filled 2018-09-22: qty 10
  Filled 2018-09-22 (×6): qty 1

## 2018-09-22 MED ORDER — METHYLPREDNISOLONE SODIUM SUCC 125 MG IJ SOLR
125.0000 mg | Freq: Once | INTRAMUSCULAR | Status: AC
  Administered 2018-09-22: 125 mg via INTRAVENOUS
  Filled 2018-09-22: qty 2

## 2018-09-22 MED ORDER — SODIUM CHLORIDE 0.9 % IV SOLN
500.0000 mg | Freq: Once | INTRAVENOUS | Status: AC
  Administered 2018-09-22: 500 mg via INTRAVENOUS
  Filled 2018-09-22 (×2): qty 500

## 2018-09-22 MED ORDER — ORAL CARE MOUTH RINSE
15.0000 mL | Freq: Two times a day (BID) | OROMUCOSAL | Status: DC
  Administered 2018-09-23 – 2018-09-30 (×12): 15 mL via OROMUCOSAL

## 2018-09-22 MED ORDER — DIPHENHYDRAMINE HCL 25 MG PO CAPS
25.0000 mg | ORAL_CAPSULE | Freq: Four times a day (QID) | ORAL | Status: DC
  Administered 2018-09-22 – 2018-09-29 (×23): 25 mg via ORAL
  Filled 2018-09-22 (×24): qty 1

## 2018-09-22 MED ORDER — AZELASTINE HCL 0.1 % NA SOLN
2.0000 | Freq: Two times a day (BID) | NASAL | Status: DC
  Administered 2018-09-22 – 2018-09-30 (×15): 2 via NASAL
  Filled 2018-09-22: qty 30

## 2018-09-22 MED ORDER — MONTELUKAST SODIUM 10 MG PO TABS
10.0000 mg | ORAL_TABLET | Freq: Every day | ORAL | Status: DC
  Administered 2018-09-22 – 2018-09-30 (×9): 10 mg via ORAL
  Filled 2018-09-22 (×10): qty 1

## 2018-09-22 MED ORDER — TRAMADOL HCL 50 MG PO TABS
50.0000 mg | ORAL_TABLET | Freq: Four times a day (QID) | ORAL | Status: DC | PRN
  Administered 2018-09-23 – 2018-09-25 (×5): 50 mg via ORAL
  Filled 2018-09-22 (×7): qty 1

## 2018-09-22 MED ORDER — DILTIAZEM HCL ER 60 MG PO CP12
60.0000 mg | ORAL_CAPSULE | Freq: Two times a day (BID) | ORAL | Status: DC
  Administered 2018-09-22 – 2018-09-26 (×8): 60 mg via ORAL
  Filled 2018-09-22 (×9): qty 1

## 2018-09-22 MED ORDER — LATANOPROST 0.005 % OP SOLN
1.0000 [drp] | Freq: Every day | OPHTHALMIC | Status: DC
  Administered 2018-09-22 – 2018-09-30 (×9): 1 [drp] via OPHTHALMIC
  Filled 2018-09-22: qty 2.5

## 2018-09-22 MED ORDER — IPRATROPIUM-ALBUTEROL 0.5-2.5 (3) MG/3ML IN SOLN
3.0000 mL | Freq: Four times a day (QID) | RESPIRATORY_TRACT | Status: DC
  Administered 2018-09-23: 3 mL via RESPIRATORY_TRACT
  Filled 2018-09-22: qty 3

## 2018-09-22 MED ORDER — SODIUM CHLORIDE 0.9 % IV SOLN
1.0000 g | Freq: Once | INTRAVENOUS | Status: AC
  Administered 2018-09-22: 1 g via INTRAVENOUS
  Filled 2018-09-22: qty 10

## 2018-09-22 MED ORDER — ENOXAPARIN SODIUM 80 MG/0.8ML ~~LOC~~ SOLN
68.0000 mg | Freq: Two times a day (BID) | SUBCUTANEOUS | Status: DC
  Administered 2018-09-22 – 2018-09-23 (×2): 70 mg via SUBCUTANEOUS
  Filled 2018-09-22: qty 0.7
  Filled 2018-09-22 (×2): qty 0.8

## 2018-09-22 MED ORDER — METHYLPREDNISOLONE SODIUM SUCC 125 MG IJ SOLR
80.0000 mg | Freq: Two times a day (BID) | INTRAMUSCULAR | Status: DC
  Administered 2018-09-23: 80 mg via INTRAVENOUS
  Filled 2018-09-22: qty 2

## 2018-09-22 MED ORDER — LORAZEPAM 0.5 MG PO TABS
0.5000 mg | ORAL_TABLET | Freq: Two times a day (BID) | ORAL | Status: DC | PRN
  Administered 2018-09-23 – 2018-09-25 (×5): 0.5 mg via ORAL
  Filled 2018-09-22 (×5): qty 1

## 2018-09-22 MED ORDER — AZITHROMYCIN 250 MG PO TABS
250.0000 mg | ORAL_TABLET | Freq: Every day | ORAL | Status: AC
  Administered 2018-09-23 – 2018-09-25 (×3): 250 mg via ORAL
  Filled 2018-09-22 (×3): qty 1

## 2018-09-22 NOTE — ED Provider Notes (Signed)
[facility]-EMERGENCY DEPT Provider Note   CSN: [id number] Arrival date & time: 09/22/18  1824     History   Chief Complaint Chief Complaint  Patient presents with  . Shortness of Breath    HPI Casey Freeman is a 82 y.o. female.  Patient is a 82 year old female with a history of pulmonary fibrosis who presents with shortness of breath.  She states her shortness of breath started about 2 to 3 weeks ago but got worse over the last couple days.  She has a cough which is mostly dry but occasionally she produces some white or yellow sputum.  She has some intermittent pain to the epigastrium but no other chest pain.  No pain now.  No known fevers.  She has some chronic mild swelling of her lower extremities bilaterally.  She states it is at baseline currently.  She has no calf pain.  She has been using her albuterol inhaler with some improvement in symptoms.  She is chronically on oxygen at 3 to 4 L/min.  She received nebulizer treatment by EMS and feels a little bit better after this.     Past Medical History:  Diagnosis Date  . Arthritis   . Hx: UTI (urinary tract infection)   . Hypertension   . PONV (postoperative nausea and vomiting)    >20 YRS AGO - NO PROBLEM SINCE    Patient Active Problem List   Diagnosis Date Noted  . Chest pain, musculoskeletal 07/08/2018  . DOE (dyspnea on exertion) 04/03/2018  . Chronic respiratory failure with hypoxia (HCC) 06/28/2017  . Morbid obesity due to excess calories (HCC) 03/29/2017  . Drug-induced diffuse interstitial pulmonary fibrosis (HCC) 12/23/2013  . Cough variant asthma 11/06/2013  . Essential hypertension 10/06/2013  . Cough 12/07/2013  . Postop Hypokalemia 05/03/2012  . OA (osteoarthritis) of knee 05/01/2012    Past Surgical History:  Procedure Laterality Date  . ABDOMINAL HYSTERECTOMY    . cataracts    . POLYPECTOMY  30 YRS AGO   VOCAL CORDS  . ROTATOR CUFF REPAIR     RT  . SHOULDER SURGERY     LEFT  . TOTAL KNEE ARTHROPLASTY  05/01/2012   Procedure: TOTAL KNEE ARTHROPLASTY;  Surgeon: Loanne Drilling, MD;  Location: [facility];  Service: Orthopedics;  Laterality: Right;     OB History   No obstetric history on file.      Home Medications    Prior to Admission medications   Medication Sig Start Date End Date Taking? Authorizing Provider  acetaminophen (TYLENOL) 500 MG tablet Take per bottle as needed for pain    [provider]  albuterol (PROAIR HFA) 108 (90 Base) MCG/ACT inhaler Inhale 2 puffs into the lungs every 4 (four) hours as needed for wheezing or shortness of breath. 12/11/15   Parrett, Virgel Bouquet, NP  Azelastine HCl 0.15 % SOLN Place 2 sprays into both nostrils at bedtime as needed (drippy nose).  11/10/16   [provider]  budesonide-formoterol (SYMBICORT) 80-4.5 MCG/ACT inhaler Inhale 2 puffs into the lungs 2 (two) times daily. 04/03/18   Nyoka Cowden, MD  chlorpheniramine (CHLOR-TRIMETON) 4 MG tablet Take 1-2 tablets by mouth every 4 hours as needed for drippy nose, drainage, and throat clearing    [provider]  dextromethorphan (DELSYM) 30 MG/5ML liquid 2 tsp every 12 hours as needed for cough    [provider]  famotidine (PEPCID) 20 MG tablet Take 20 mg by mouth at bedtime.  [provider]  furosemide (LASIX) 40 MG tablet Take 1 tablet (40 mg total) by mouth daily as needed for edema. 07/05/18   Nyoka CowdenWert, Michael B, MD  glucosamine-chondroitin 500-400 MG tablet Take 1 tablet by mouth daily.    [provider]  hydrochlorothiazide (HYDRODIURIL) 25 MG tablet Take 25 mg by mouth daily.     [provider]  LORazepam (ATIVAN) 0.5 MG tablet Take 0.5 mg by mouth 2 (two) times daily as needed.     [provider]  Magnesium 250 MG TABS Take 1 tablet by mouth daily. Reported on 12/11/2015    [provider]  OXYGEN 3 lpm 24/7  [employer]    [provider]  pantoprazole (PROTONIX) 40 MG  tablet Take 1 tablet (40 mg total) by mouth daily before breakfast. 08/31/18   Nyoka CowdenWert, Michael B, MD  predniSONE (DELTASONE) 10 MG tablet Take 10 mg by mouth daily with breakfast. For flare of breathing take 2 tabs daily till better, then resume 1 daily.    [provider]  Respiratory Therapy Supplies (FLUTTER) DEVI Use as directed 07/05/18   Nyoka CowdenWert, Michael B, MD  traMADol (ULTRAM) 50 MG tablet 1-2 every 4 hours as needed    [provider]  TRAVATAN Z 0.004 % SOLN ophthalmic solution Apply 1 drop to eye at bedtime. 02/27/15   [provider]    Family History Family History  Problem Relation Age of Onset  . Breast cancer Neg Hx     Social History Social History   Tobacco Use  . Smoking status: Never Smoker  . Smokeless tobacco: Never Used  Substance Use Topics  . Alcohol use: No  . Drug use: No     Allergies   Doxycycline; Hydrocodone; Macrodantin [nitrofurantoin macrocrystal]; and Simvastatin   Review of Systems Review of Systems  Constitutional: Positive for fatigue. Negative for chills, diaphoresis and fever.  HENT: Negative for congestion, rhinorrhea and sneezing.   Eyes: Negative.   Respiratory: Positive for cough and shortness of breath. Negative for chest tightness.   Cardiovascular: Negative for chest pain and leg swelling.  Gastrointestinal: Negative for abdominal pain, blood in stool, diarrhea, nausea and vomiting.  Genitourinary: Negative for difficulty urinating, flank pain, frequency and hematuria.  Musculoskeletal: Negative for arthralgias and back pain.  Skin: Negative for rash.  Neurological: Positive for weakness (generalized). Negative for dizziness, speech difficulty, numbness and headaches.     Physical Exam Updated Vital Signs BP 122/82 (BP Location: Right Arm)   Pulse 82   Temp 98.6 F (37 C) (Oral)   Resp (!) 22   SpO2 96%   Physical Exam Constitutional:      Appearance: She is well-developed.  HENT:     Head:  Normocephalic and atraumatic.  Eyes:     Pupils: Pupils are equal, round, and reactive to light.  Neck:     Musculoskeletal: Normal range of motion and neck supple.  Cardiovascular:     Rate and Rhythm: Normal rate and regular rhythm.     Heart sounds: Normal heart sounds.  Pulmonary:     Effort: Tachypnea and accessory muscle usage present. No respiratory distress.     Breath sounds: Decreased breath sounds, wheezing and rhonchi present. No rales.  Chest:     Chest wall: No tenderness.  Abdominal:     General: Bowel sounds are normal.     Palpations: Abdomen is soft.     Tenderness: There is no abdominal tenderness. There is no guarding  or rebound.  Musculoskeletal: Normal range of motion.     Right lower leg: Edema present.     Left lower leg: Edema present.     Comments: 1+ pain edema bilaterally, no calf tenderness  Lymphadenopathy:     Cervical: No cervical adenopathy.  Skin:    General: Skin is warm and dry.     Findings: No rash.  Neurological:     Mental Status: She is alert and oriented to person, place, and time.      ED Treatments / Results  Labs (all labs ordered are listed, but only abnormal results are displayed) Labs Reviewed  BASIC METABOLIC PANEL - Abnormal; Notable for the following components:      Result Value   Glucose, Bld 133 (*)    All other components within normal limits  CBC WITH DIFFERENTIAL/PLATELET - Abnormal; Notable for the following components:   WBC 13.8 (*)    Neutro Abs 10.8 (*)    All other components within normal limits  I-STAT CHEM 8, ED - Abnormal; Notable for the following components:   Glucose, Bld 134 (*)    Calcium, Ion 1.14 (*)    All other components within normal limits  I-STAT TROPONIN, ED    EKG EKG Interpretation  Date/Time:  Saturday September 22 2018 20:10:05 EST Ventricular Rate:  118 PR Interval:    QRS Duration: 88 QT Interval:  349 QTC Calculation: 489 R Axis:   80 Text Interpretation:  Undetermined  rhythm Borderline prolonged QT interval Confirmed by Rolan Bucco [phone]) on 09/22/2018 8:24:53 PM   Radiology Dg Chest 2 View  Result Date: 09/22/2018 CLINICAL DATA:  Shortness of breath, history pulmonary fibrosis, hypertension EXAM: CHEST - 2 VIEW COMPARISON:  07/05/2018 FINDINGS: Enlargement of cardiac silhouette. Mediastinal contour stable. Severe diffuse BILATERAL chronic lung opacities which appear more confluent than on the previous exam. The suggests a combination of chronic interstitial disease/fibrosis and superimposed acute infiltrates. No pleural effusion or pneumothorax. Bones demineralized. IMPRESSION: Increased pulmonary opacities bilaterally question superimposed infiltrate upon a background of pulmonary fibrosis. Electronically Signed   By: Ulyses Southward M.D.   On: 09/22/2018 19:55    Procedures Procedures (including critical care time)  Medications Ordered in ED Medications  cefTRIAXone (ROCEPHIN) 1 g in sodium chloride 0.9 % 100 mL IVPB (has no administration in time range)  azithromycin (ZITHROMAX) 500 mg in sodium chloride 0.9 % 250 mL IVPB (has no administration in time range)  methylPREDNISolone sodium succinate (SOLU-MEDROL) 125 mg/2 mL injection 125 mg (125 mg Intravenous Given 09/22/18 2010)     Initial Impression / Assessment and Plan / ED Course  I have reviewed the triage vital signs and the nursing notes.  Pertinent labs & imaging results that were available during my care of the patient were reviewed by me and considered in my medical decision making (see chart for details).     Patient is a 82 year old female with a history of pulmonary fibrosis on home oxygen who presents with worsening shortness of breath.  She states she walked to the bathroom and when she was trying to walk back she got severely short of breath.  She has better after nebulizer treatments.  She also got Solu-Medrol in the ED.  She has no suggestions of ACS.  Chest x-ray does show some  concerning findings for pneumonia.  She was given antibiotics for community-acquired pneumonia.  She also has new onset A. fib.  Her rate is relatively controlled in the low 100s.  She does not have a sense that her heart is racing so it is unclear when this started. I spoke to Dr. Willaim BanePark who will admit the pt for further treatment.  This patients CHA2DS2-VASc Score and unadjusted Ischemic Stroke Rate (% per year) is equal to 4.8 % stroke rate/year from a score of 4  Above score calculated as 1 point each if present [CHF, HTN, DM, Vascular=MI/PAD/Aortic Plaque, Age if 65-74, or Female] Above score calculated as 2 points each if present [Age > 75, or Stroke/TIA/TE]    Final Clinical Impressions(s) / ED Diagnoses   Final diagnoses:  Shortness of breath  Pulmonary fibrosis (HCC)  Community acquired pneumonia, unspecified laterality  New onset atrial fibrillation [facility](HCC)    ED Discharge Orders    None       Rolan BuccoBelfi, Ceasia Elwell, MD 09/22/18 2211

## 2018-09-22 NOTE — H&P (Signed)
History and Physical  Casey Freeman:[id number] DOB: Aug 08, 1936 DOA: 09/22/2018 1825  Referring physician: Ardeen Jourdain [facility]) PCP: Johny Blamer, MD  HISTORY   Chief Complaint: dyspnea  HPI: PURA Freeman is a 82 y.o. female with hx of interstitial pulmonary fibrosis (on chronic home O2 3-4 lpm), recurrent UTIs, HTN who presented from [facility] assisted living facility with acute dyspnea and increased hypoxia from baseline. Patient reports that for the past 2-3 weeks, she has had progressive worsening dyspnea on exertion and that she would have concurrent episodes of chest heaviness, lower sternal tightness lasting several minutes to hours with associated dyspnea. At baseline has chronic rhinorrhea and chronic (mostly) dry cough, which has not significantly changed from her baseline. Is  Normally on 3 LPM but has been requiring 4-5 in the past few days. Earlier today, patient was unable to ambulate (with walker) from the bathroom without having to sit down due to dyspnea.  Denies sick contacts. Was recently prescribed bactrim for presumed UTI by outpatient provider (has longstanding recurrent UTIs) in the past week. Denies fevers/chills. Is on chronic 10mg  prednisone daily for pulm fibrosis.   Of note, patient reports that several years ago when she was in her 8s, she had an episode of heart palpitations (heart racing with dizziness lasting several minutes) while ambulating at the mall. She recalls that subsequent cardiac workup was unremarkable and was told that she had occasional "skipped beats." Otherwise, she has never been formally diagnosed with afib or other arrhythmia since then.   Review of Systems:  + increasing dyspnea, intermittent associated lower sternal tightness and heaviness - no fevers/chills + chronic cough and mild rhinorrhea (unchanged from baseline) - no edema, PND, orthopnea - no nausea/vomiting; no tarry, melanotic or bloody stools - no dysuria, increased  urinary frequency in the past few days - no weight changes Rest of systems reviewed are negative, except as per above history.   ED course:  Vitals Blood pressure 109/90, pulse (!) 101, temperature 98.6 F (37 C), temperature source Oral, resp. rate (!) 37, SpO2 97 %. Received solumedrol 125mg  IV x 1; ceftriaxone 1gm x 1; albuterol nebs in EMS  Past Medical History:  Diagnosis Date  . Arthritis   . Hx: UTI (urinary tract infection)   . Hypertension   . PONV (postoperative nausea and vomiting)    >20 YRS AGO - NO PROBLEM SINCE   Past Surgical History:  Procedure Laterality Date  . ABDOMINAL HYSTERECTOMY    . cataracts    . POLYPECTOMY  30 YRS AGO   VOCAL CORDS  . ROTATOR CUFF REPAIR     RT  . SHOULDER SURGERY     LEFT  . TOTAL KNEE ARTHROPLASTY  05/01/2012   Procedure: TOTAL KNEE ARTHROPLASTY;  Surgeon: Loanne Drilling, MD;  Location: [facility];  Service: Orthopedics;  Laterality: Right;    Social History:  reports that she has never smoked. She has never used smokeless tobacco. She reports that she does not drink alcohol or use drugs.  Allergies  Allergen Reactions  . Doxycycline Other (See Comments)    Sore throat   . Hydrocodone     Makes her dizzy  . Macrodantin [Nitrofurantoin Macrocrystal]     Lung Dz  . Simvastatin Rash    Family History  Problem Relation Age of Onset  . Breast cancer Neg Hx       Prior to Admission medications   Medication Sig Start Date End Date Taking?  Authorizing Provider  acetaminophen (TYLENOL) 325 MG tablet Take 650 mg by mouth every 12 (twelve) hours as needed for mild pain, moderate pain or headache.   Yes [provider]  albuterol (PROAIR HFA) 108 (90 Base) MCG/ACT inhaler Inhale 2 puffs into the lungs every 4 (four) hours as needed for wheezing or shortness of breath. 12/11/15  Yes Parrett, Tammy S, NP  aspirin EC 81 MG tablet Take 81 mg by mouth daily.   Yes [provider]  Azelastine HCl 0.15 % SOLN Place 2  sprays into both nostrils 2 (two) times daily.  11/10/16  Yes [provider]  budesonide-formoterol (SYMBICORT) 80-4.5 MCG/ACT inhaler Inhale 2 puffs into the lungs 2 (two) times daily. 04/03/18  Yes Nyoka Cowden, MD  chlorpheniramine (CHLOR-TRIMETON) 4 MG tablet Take 1-2 tablets by mouth every 4 hours as needed for drippy nose, drainage, and throat clearing   Yes [provider]  famotidine (PEPCID) 20 MG tablet Take 20 mg by mouth at bedtime.    Yes [provider]  furosemide (LASIX) 40 MG tablet Take 1 tablet (40 mg total) by mouth daily as needed for edema. 07/05/18  Yes Nyoka Cowden, MD  hydrochlorothiazide (HYDRODIURIL) 25 MG tablet Take 25 mg by mouth daily as needed (swelling).    Yes [provider]  LORazepam (ATIVAN) 0.5 MG tablet Take 0.5 mg by mouth every 12 (twelve) hours as needed for anxiety.    Yes [provider]  montelukast (SINGULAIR) 10 MG tablet Take 10 mg by mouth at bedtime.   Yes [provider]  OXYGEN 2 L/min continuous.    Yes [provider]  pantoprazole (PROTONIX) 40 MG tablet Take 1 tablet (40 mg total) by mouth daily before breakfast. 08/31/18  Yes Nyoka Cowden, MD  predniSONE (DELTASONE) 10 MG tablet Take 10 mg by mouth daily with breakfast.    Yes [provider]  Respiratory Therapy Supplies (FLUTTER) DEVI Use as directed 07/05/18  Yes Nyoka Cowden, MD  sulfamethoxazole-trimethoprim (BACTRIM DS,SEPTRA DS) 800-160 MG tablet Take 1 tablet by mouth 2 (two) times daily.   Yes [provider]  traMADol (ULTRAM) 50 MG tablet Take 50 mg by mouth every 6 (six) hours as needed for moderate pain or severe pain.    Yes [provider]  TRAVATAN Z 0.004 % SOLN ophthalmic solution Apply 1 drop to eye at bedtime. 02/27/15  Yes [provider]    PHYSICAL EXAM   Temp:  [98.6 F (37 C)] 98.6 F (37 C) (12/21 1835) Pulse Rate:  [82-101] 101 (12/21 2249) Resp:  [22-37]  37 (12/21 2249) BP: (109-122)/(82-90) 109/90 (12/21 2249) SpO2:  [96 %-97 %] 97 % (12/21 2249)  BP 109/90   Pulse (!) 101   Temp 98.6 F (37 C) (Oral)   Resp (!) 37   SpO2 97%    GEN thin elderly caucasian female; mildly short of breath while talking; able to speak 5-10 words at a time  HEENT NCAT EOM intact PERRL; clear oropharynx, no cervical LAD; moist mucus membranes  JVP estimated 7 cm H2O above RA; no HJR ; no carotid bruits b/l ;  CV irregular tachycardic; normal S1 and S2; no m/r/g or S3/S4; PMI non displaced; no parasternal heave  RESP crackles throughout diffusely; bronchial breath sounds over R lung; + accessory mm use; expiratory wheezing throughout ABD soft NT ND +normoactive BS  EXT warm throughout b/l; no peripheral edema b/l  PULSES  DP and  radials 2+ intact b/l  SKIN/MSK no rashes or lesions  NEURO/PSYCH AAOx4; no focal deficits   DATA   LABS ON ADMISSION:  Basic Metabolic Panel: Recent Labs  Lab 09/22/18 1956 09/22/18 2006  NA 138 137  K 4.1 4.0  CL 103 103  CO2 24  --   GLUCOSE 133* 134*  BUN 23 22  CREATININE 0.74 0.70  CALCIUM 8.9  --    CBC: Recent Labs  Lab 09/22/18 1956 09/22/18 2006  WBC 13.8*  --   NEUTROABS 10.8*  --   HGB 13.3 13.9  HCT 42.6 41.0  MCV 98.8  --   PLT 245  --    Liver Function Tests: No results for input(s): AST, ALT, ALKPHOS, BILITOT, PROT, ALBUMIN in the last 168 hours. No results for input(s): LIPASE, AMYLASE in the last 168 hours. No results for input(s): AMMONIA in the last 168 hours. Coagulation:  Lab Results  Component Value Date   INR 1.01 04/23/2012   No results found for: PTT Lactic Acid, Venous:  No results found for: LATICACIDVEN Cardiac Enzymes: No results for input(s): CKTOTAL, CKMB, CKMBINDEX, TROPONINI in the last 168 hours. Urinalysis:    Component Value Date/Time   COLORURINE YELLOW 04/23/2012 0924   APPEARANCEUR CLEAR 04/23/2012 0924   LABSPEC 1.014 04/23/2012 0924   PHURINE 7.0  04/23/2012 0924   GLUCOSEU NEGATIVE 04/23/2012 0924   HGBUR NEGATIVE 04/23/2012 0924   BILIRUBINUR NEGATIVE 04/23/2012 0924   KETONESUR NEGATIVE 04/23/2012 0924   PROTEINUR NEGATIVE 04/23/2012 0924   UROBILINOGEN 0.2 04/23/2012 0924   NITRITE NEGATIVE 04/23/2012 0924   LEUKOCYTESUR NEGATIVE 04/23/2012 0924    BNP (last 3 results) Recent Labs    04/03/18 0926  PROBNP 120.0*   CBG: No results for input(s): GLUCAP in the last 168 hours.  Radiological Exams on Admission: Dg Chest 2 View  Result Date: 09/22/2018 CLINICAL DATA:  Shortness of breath, history pulmonary fibrosis, hypertension EXAM: CHEST - 2 VIEW COMPARISON:  07/05/2018 FINDINGS: Enlargement of cardiac silhouette. Mediastinal contour stable. Severe diffuse BILATERAL chronic lung opacities which appear more confluent than on the previous exam. The suggests a combination of chronic interstitial disease/fibrosis and superimposed acute infiltrates. No pleural effusion or pneumothorax. Bones demineralized. IMPRESSION: Increased pulmonary opacities bilaterally question superimposed infiltrate upon a background of pulmonary fibrosis. Electronically Signed   By: Ulyses SouthwardMark  Boles M.D.   On: 09/22/2018 19:55    EKG: Independently reviewed. afib at 118 bpm vs ectopic atrial tachycardia   ASSESSMENT AND PLAN   Assessment: Casey PoBronna B Freeman is a 82 y.o. female with hx of interstitial pulmonary fibrosis (on chronic home O2 3-4 lpm), recurrent UTIs, HTN who presents from assisted living facility ([facility]) with subacute worsening dyspnea on exertion + hypoxia and associated chest heaviness. Reports improvement in breathing with IV steroids and nebs but still has +significant wheezing. CXR showing worsening lung opacities and exam also notable for new onset afib with ventricular rates in 80-130s. Otherwise afebrile; no clear URI sx or worsening cough history. History and exam is not entirely consistent with true pneumonia, but given clinical  improvement with breathing treatments and steroids, will continue tx for pulm fibrosis bronchospastic flare type and continue coverage for CAP. No sx of viral resp illness or influenza on exam. Unclear if paroxysmal afib episode is triggering dyspnea, especially given somewhat paroxysmal nature of her dyspnea over the past week vs afib being an incidental finding in setting of longstanding lung disease that is only now being diagnosed.  Will do basic cardiac workup with echo/telemetry and continue discussion re: anticoagulation.    Active Problems:   Dyspnea  Plan:   # Acute dyspnea in setting of pulmonary fibrosis: fibrosis flare vs CAP > CXR showing increased confluence of b/l opacities - continue azithro + ceftriaxone (D1 12/21) - solumedrol 80mg  IV BID tomorrow (s/p 125mg  IV solumedrol 12/21) - will need extended steroid taper - on chronic prednisone 10mg  daily at home - consider pulm consult while inpatient re: steroid duration - echo and afib workup as below - standing q6h nebs - incentive spirometry - cont pulse ox; sat goal > 88%   # Paroxysmal afib, unclear chronicity. CHADS2VASc at least 4 > likely in setting of interstitial lung disease. euvolemic on exam. Initial trop neg - telemetry and daily EKG - TTE ordered - trial diltiazem SR 60mg  BID starting tonight - would avoid b-blocker given lung disease - anticoag: lovenox 80mg  BID while inpatient - discuss with patient and family tomorrow re anticoagulation - will need outpatient cardiology  # Anxiety, chronic back pain - resume home bid ativan 0.5mg  prn anxiety - prn tramadol 50mg  q6h pain  # Chronic mild rhinorrhea and allergies - resume home azelastine - resume home singulair    # Glaucoma - resume home eye drops (latanoprost while inpatient)  # HTN history - currently near normotension   - will observe BP response to ditiliazem trial  DVT Prophylaxis: lovenox therapuetic BID given pAF  Code Status:  DNR  (confirmed code status with patient at bedside) Family Communication: patient and family at bedside  Disposition Plan: admit to telemetry floor; dispo pending clinical improvement and afib workup   Patient contact: Extended Emergency Contact Information Primary Emergency Contact: Dorma RussellHuber,Kathy  [place] Mobile Phone: [phone] Relation: Friend  Time spent: > 35 mins  Ike Benearolyn J Duwan Adrian, MD [facility] Pager [phone]  If 7PM-7AM, please contact night-coverage [email or web address] Password [email or web address] 09/22/2018, 11:16 PM

## 2018-09-22 NOTE — ED Notes (Signed)
Pt vitals taken, and pt placed on monitor, RN notified.

## 2018-09-22 NOTE — ED Notes (Signed)
ED TO INPATIENT HANDOFF REPORT  Name/Age/Gender Casey Freeman 82 y.o. female  Code Status    Code Status Orders  (From admission, onward)         Start     Ordered   09/22/18 2234  Do not attempt resuscitation (DNR)  Continuous    Question Answer Comment  In the event of cardiac or respiratory ARREST Do not call a "code blue"   In the event of cardiac or respiratory ARREST Do not perform Intubation, CPR, defibrillation or ACLS   In the event of cardiac or respiratory ARREST Use medication by any route, position, wound care, and other measures to relive pain and suffering. May use oxygen, suction and manual treatment of airway obstruction as needed for comfort.   Comments bipap okay      09/22/18 2234        Code Status History    Date Active Date Inactive Code Status Order ID Comments User Context   05/01/2012 1813 05/04/2012 1453 Full Code [id number]  Lynelle DoctorFranklin, Danna Ione, RN Inpatient      Home/SNF/Other Home  Chief Complaint Respiratory distress  Level of Care/Admitting Diagnosis ED Disposition    ED Disposition Condition Comment   Admit  [facility] [[id number]]  Level of Care: Telemetry [5]  Admit to tele based on following criteria: Complex arrhythmia (Bradycardia/Tachycardia)  Diagnosis: Dyspnea [086578][[id number]]  Admitting Physician: Ike BenePARK, CAROLYN J [[id number]]  Attending Physician: Ike BenePARK, CAROLYN J [[id number]]  Estimated length of stay: past midnight tomorrow  Certification:: I certify this patient will need inpatient services for at least 2 midnights  PT Class (Do Not Modify): Inpatient [101]  PT Acc Code (Do Not Modify): Private [1]       Medical History Past Medical History:  Diagnosis Date  . Arthritis   . Hx: UTI (urinary tract infection)   . Hypertension   . PONV (postoperative nausea and vomiting)    >20 YRS AGO - NO PROBLEM SINCE    Allergies Allergies  Allergen Reactions  . Doxycycline Other (See Comments)    Sore  throat   . Hydrocodone     Makes her dizzy  . Macrodantin [Nitrofurantoin Macrocrystal]     Lung Dz  . Simvastatin Rash    IV Location/Drains/Wounds Patient Lines/Drains/Airways Status   Active Line/Drains/Airways    Name:   Placement date:   Placement time:   Site:   Days:   Peripheral IV 09/22/18 Right Forearm   09/22/18    1908    Forearm   less than 1   AIRWAYS   05/01/12    1523     2335   Incision 05/01/12 Knee Right   05/01/12    1607     2335          Labs/Imaging Results for orders placed or performed during the hospital encounter of 09/22/18 (from the past 48 hour(s))  Basic metabolic panel     Status: Abnormal   Collection Time: 09/22/18  7:56 PM  Result Value Ref Range   Sodium 138 135 - 145 mmol/L   Potassium 4.1 3.5 - 5.1 mmol/L   Chloride 103 98 - 111 mmol/L   CO2 24 22 - 32 mmol/L   Glucose, Bld 133 (H) 70 - 99 mg/dL   BUN 23 8 - 23 mg/dL   Creatinine, Ser 4.010.74 0.44 - 1.00 mg/dL   Calcium 8.9 8.9 - 02.710.3 mg/dL   GFR calc non Af Amer >60 >60 mL/min  GFR calc Af Amer >60 >60 mL/min   Anion gap 11 5 - 15    Comment: Performed at [facility], [address] [place], [place] [address]  CBC with Differential     Status: Abnormal   Collection Time: 09/22/18  7:56 PM  Result Value Ref Range   WBC 13.8 (H) 4.0 - 10.5 K/uL   RBC 4.31 3.87 - 5.11 MIL/uL   Hemoglobin 13.3 12.0 - 15.0 g/dL   HCT 60.442.6 54.036.0 - 98.146.0 %   MCV 98.8 80.0 - 100.0 fL   MCH 30.9 26.0 - 34.0 pg   MCHC 31.2 30.0 - 36.0 g/dL   RDW 19.112.8 47.811.5 - 29.515.5 %   Platelets 245 150 - 400 K/uL   nRBC 0.0 0.0 - 0.2 %   Neutrophils Relative % 77 %   Neutro Abs 10.8 (H) 1.7 - 7.7 K/uL   Lymphocytes Relative 11 %   Lymphs Abs 1.5 0.7 - 4.0 K/uL   Monocytes Relative 6 %   Monocytes Absolute 0.8 0.1 - 1.0 K/uL   Eosinophils Relative 4 %   Eosinophils Absolute 0.5 0.0 - 0.5 K/uL   Basophils Relative 1 %   Basophils Absolute 0.1 0.0 - 0.1 K/uL   Immature Granulocytes 1 %   Abs  Immature Granulocytes 0.07 0.00 - 0.07 K/uL    Comment: Performed at [facility], [address] [place], [place] [address]  I-stat troponin, ED     Status: None   Collection Time: 09/22/18  8:05 PM  Result Value Ref Range   Troponin i, poc 0.05 0.00 - 0.08 ng/mL   Comment 3            Comment: Due to the release kinetics of cTnI, a negative result within the first hours of the onset of symptoms does not rule out myocardial infarction with certainty. If myocardial infarction is still suspected, repeat the test at appropriate intervals.   I-stat Chem 8, ED     Status: Abnormal   Collection Time: 09/22/18  8:06 PM  Result Value Ref Range   Sodium 137 135 - 145 mmol/L   Potassium 4.0 3.5 - 5.1 mmol/L   Chloride 103 98 - 111 mmol/L   BUN 22 8 - 23 mg/dL   Creatinine, Ser 8.650.70 0.44 - 1.00 mg/dL   Glucose, Bld 784134 (H) 70 - 99 mg/dL   Calcium, Ion 6.961.14 (L) 1.15 - 1.40 mmol/L   TCO2 27 22 - 32 mmol/L   Hemoglobin 13.9 12.0 - 15.0 g/dL   HCT 29.541.0 28.436.0 - 13.246.0 %   Dg Chest 2 View  Result Date: 09/22/2018 CLINICAL DATA:  Shortness of breath, history pulmonary fibrosis, hypertension EXAM: CHEST - 2 VIEW COMPARISON:  07/05/2018 FINDINGS: Enlargement of cardiac silhouette. Mediastinal contour stable. Severe diffuse BILATERAL chronic lung opacities which appear more confluent than on the previous exam. The suggests a combination of chronic interstitial disease/fibrosis and superimposed acute infiltrates. No pleural effusion or pneumothorax. Bones demineralized. IMPRESSION: Increased pulmonary opacities bilaterally question superimposed infiltrate upon a background of pulmonary fibrosis. Electronically Signed   By: Ulyses SouthwardMark  Boles M.D.   On: 09/22/2018 19:55   EKG Interpretation  Date/Time:  Saturday September 22 2018 20:10:05 EST Ventricular Rate:  118 PR Interval:    QRS Duration: 88 QT Interval:  349 QTC Calculation: 489 R Axis:   80 Text Interpretation:  Undetermined  rhythm Borderline prolonged QT interval Confirmed by Rolan BuccoBelfi, Melanie [phone]([id number]) on 09/22/2018 8:24:53 PM  Pending Labs Unresulted Labs (From admission, onward)    Start     Ordered   09/22/18 2239  TSH  Add-on,   R     09/22/18 2242          Vitals/Pain Today's Vitals   09/22/18 1835 09/22/18 1906 09/22/18 2249  BP: 122/82  109/90  Pulse: 82  (!) 101  Resp: (!) 22  (!) 37  Temp: 98.6 F (37 C)    TempSrc: Oral    SpO2: 96%  97%  PainSc:  0-No pain     Isolation Precautions No active isolations  Medications Medications  cefTRIAXone (ROCEPHIN) 1 g in sodium chloride 0.9 % 100 mL IVPB (1 g Intravenous New Bag/Given 09/22/18 2246)  azithromycin (ZITHROMAX) 500 mg in sodium chloride 0.9 % 250 mL IVPB (has no administration in time range)  cefTRIAXone (ROCEPHIN) 1 g in sodium chloride 0.9 % 100 mL IVPB (has no administration in time range)  azithromycin (ZITHROMAX) tablet 250 mg (has no administration in time range)  ipratropium-albuterol (DUONEB) 0.5-2.5 (3) MG/3ML nebulizer solution 3 mL (has no administration in time range)  methylPREDNISolone sodium succinate (SOLU-MEDROL) 125 mg/2 mL injection 80 mg (has no administration in time range)  enoxaparin (LOVENOX) injection 70 mg (has no administration in time range)  diltiazem (CARDIZEM SR) 12 hr capsule 60 mg (has no administration in time range)  azelastine (ASTELIN) 0.1 % nasal spray 2 spray (has no administration in time range)  diphenhydrAMINE (BENADRYL) capsule 25 mg (has no administration in time range)  LORazepam (ATIVAN) tablet 0.5 mg (has no administration in time range)  montelukast (SINGULAIR) tablet 10 mg (has no administration in time range)  traMADol (ULTRAM) tablet 50 mg (has no administration in time range)  latanoprost (XALATAN) 0.005 % ophthalmic solution 1 drop (has no administration in time range)  methylPREDNISolone sodium succinate (SOLU-MEDROL) 125 mg/2 mL injection 125 mg (125 mg Intravenous Given 09/22/18  2010)    Mobility walks with person assist

## 2018-09-22 NOTE — ED Triage Notes (Signed)
Pt is presented by EMS from [facility] with c/o shortness of breath. She received 5mg  of Albuterol and 0.5mg  Atrovent from EMS who reported bilateral wheezing on auscultation.  Pt has hx of pulmonary fibrosis and that in particular this week she has experienced respiratory problems more than usual and that it worsened more today.

## 2018-09-23 ENCOUNTER — Other Ambulatory Visit: Payer: Self-pay

## 2018-09-23 ENCOUNTER — Inpatient Hospital Stay (HOSPITAL_COMMUNITY): Payer: Medicare Other

## 2018-09-23 DIAGNOSIS — I340 Nonrheumatic mitral (valve) insufficiency: Secondary | ICD-10-CM

## 2018-09-23 DIAGNOSIS — M1710 Unilateral primary osteoarthritis, unspecified knee: Secondary | ICD-10-CM

## 2018-09-23 DIAGNOSIS — I10 Essential (primary) hypertension: Secondary | ICD-10-CM

## 2018-09-23 DIAGNOSIS — I361 Nonrheumatic tricuspid (valve) insufficiency: Secondary | ICD-10-CM

## 2018-09-23 DIAGNOSIS — R05 Cough: Secondary | ICD-10-CM

## 2018-09-23 DIAGNOSIS — I370 Nonrheumatic pulmonary valve stenosis: Secondary | ICD-10-CM

## 2018-09-23 LAB — TSH: TSH: 0.141 u[IU]/mL — ABNORMAL LOW (ref 0.350–4.500)

## 2018-09-23 LAB — ECHOCARDIOGRAM COMPLETE
Height: 62 in
Weight: 2229.29 oz

## 2018-09-23 LAB — BRAIN NATRIURETIC PEPTIDE: B Natriuretic Peptide: 162.4 pg/mL — ABNORMAL HIGH (ref 0.0–100.0)

## 2018-09-23 LAB — T4, FREE: Free T4: 1.22 ng/dL (ref 0.82–1.77)

## 2018-09-23 LAB — PROCALCITONIN: Procalcitonin: 0.34 ng/mL

## 2018-09-23 MED ORDER — IPRATROPIUM-ALBUTEROL 0.5-2.5 (3) MG/3ML IN SOLN
3.0000 mL | Freq: Four times a day (QID) | RESPIRATORY_TRACT | Status: DC
  Administered 2018-09-23 – 2018-09-26 (×16): 3 mL via RESPIRATORY_TRACT
  Filled 2018-09-23 (×16): qty 3

## 2018-09-23 MED ORDER — BUDESONIDE 0.5 MG/2ML IN SUSP
0.5000 mg | Freq: Two times a day (BID) | RESPIRATORY_TRACT | Status: DC
  Administered 2018-09-23 – 2018-10-01 (×16): 0.5 mg via RESPIRATORY_TRACT
  Filled 2018-09-23 (×16): qty 2

## 2018-09-23 MED ORDER — BENZONATATE 100 MG PO CAPS
100.0000 mg | ORAL_CAPSULE | Freq: Three times a day (TID) | ORAL | Status: DC | PRN
  Administered 2018-09-23 – 2018-09-29 (×7): 100 mg via ORAL
  Filled 2018-09-23 (×8): qty 1

## 2018-09-23 MED ORDER — ENOXAPARIN SODIUM 60 MG/0.6ML ~~LOC~~ SOLN
60.0000 mg | Freq: Two times a day (BID) | SUBCUTANEOUS | Status: DC
  Administered 2018-09-23 – 2018-09-29 (×12): 60 mg via SUBCUTANEOUS
  Filled 2018-09-23 (×12): qty 0.6

## 2018-09-23 MED ORDER — METHYLPREDNISOLONE SODIUM SUCC 40 MG IJ SOLR
40.0000 mg | Freq: Three times a day (TID) | INTRAMUSCULAR | Status: DC
  Administered 2018-09-23 – 2018-09-24 (×2): 40 mg via INTRAVENOUS
  Filled 2018-09-23 (×3): qty 1

## 2018-09-23 NOTE — Progress Notes (Signed)
PT prefers to have family member (that is present and agrees) bring Flutter from home. RN aware.

## 2018-09-23 NOTE — Progress Notes (Signed)
PROGRESS NOTE    Mauricio PoBronna B Mabile  ZOX:[id number] DOB: 11-Mar-1936 DOA: 09/22/2018 PCP: Johny BlamerHarris, William, MD   Brief Narrative:  82 year old with history of interstitial pulmonary fibrosis, chronic hypoxia on 3-4 L nasal cannula, essential hypertension came from assisted living [facility] with complains of acute shortness of breath and chest heaviness.  She was diagnosed with likely acute flare of her pulmonary fibrosis and started on IV azithromycin and Rocephin.   Assessment & Plan:   Principal Problem:   Dyspnea Active Problems:   OA (osteoarthritis) of knee   Cough   Essential hypertension   Drug-induced diffuse interstitial pulmonary fibrosis (HCC)   Chronic respiratory failure with hypoxia (HCC)   Chest pain, musculoskeletal   Acute respiratory distress secondary to pulmonary fibrosis flare -Supplemental oxygen, this morning requiring 5 L nasal cannula - On Rocephin and azithromycin -Procalcitonin 0.34 -Nebulizer treatments -We will add Pulmicort - We will change Solu-Medrol to 40 mg every 8 hours  Paroxysmal atrial fibrillation, CHADsVASc 4 - Suspect secondary to underlying pulmonary pathology -Echocardiogram-pending - Cardizem sustained release 60 mg twice daily - On Lovenox 60 mg twice daily, will plan on switching this to Eliquis twice daily as patient is interested in anticoagulation and no other relative contraindication -Low TSH, check T3-T4  Essential hypertension - On Cardizem 60 mg twice daily  Chronic back pain and anxiety -Pain control as needed  Glaucoma -Continue home meds  DVT prophylaxis: 60 mg twice daily with plans to transition to Eliquis Code Status: DNR Family Communication: None at bedside Disposition Plan: Pending clinical improvement from respiratory standpoint  Consultants:   None  Procedures:   None  Antimicrobials:   Azithromycin  Rocephin   Subjective: Her breathing is slightly better but still significantly short  of breath and far away from her baseline.  She is still unable to speak in long sentences without getting short of breath.  Review of Systems Otherwise negative except as per HPI, including: General: Denies fever, chills, night sweats or unintended weight loss. Resp: Denies wheezing Cardiac: Denies chest pain, palpitations, orthopnea, paroxysmal nocturnal dyspnea. GI: Denies abdominal pain, nausea, vomiting, diarrhea or constipation GU: Denies dysuria, frequency, hesitancy or incontinence MS: Denies muscle aches, joint pain or swelling Neuro: Denies headache, neurologic deficits (focal weakness, numbness, tingling), abnormal gait Psych: Denies anxiety, depression, SI/HI/AVH Skin: Denies new rashes or lesions ID: Denies sick contacts, exotic exposures, travel  Objective: Vitals:   09/23/18 0003 09/23/18 0514 09/23/18 0808 09/23/18 1431  BP:  114/74  110/68  Pulse:  70  (!) 109  Resp:  20  16  Temp:  (!) 97.4 F (36.3 C)  98.1 F (36.7 C)  TempSrc:  Oral  Oral  SpO2: 95% 98% (!) 86%   Weight:      Height:        Intake/Output Summary (Last 24 hours) at 09/23/2018 1444 Last data filed at 09/23/2018 0600 Gross per 24 hour  Intake 347.47 ml  Output -  Net 347.47 ml   Filed Weights   09/22/18 2323  Weight: 63.2 kg    Examination:  General exam: Slight distress due to shortness of breath, on 5-6 L nasal cannula Respiratory system: Diffuse diminished breath sounds Cardiovascular system: S1 & S2 heard, RRR. No JVD, murmurs, rubs, gallops or clicks. No pedal edema. Gastrointestinal system: Abdomen is nondistended, soft and nontender. No organomegaly or masses felt. Normal bowel sounds heard. Central nervous system: Alert and oriented. No focal neurological deficits. Extremities: Symmetric 5 x 5 power.  Skin: No rashes, lesions or ulcers Psychiatry: Judgement and insight appear normal. Mood & affect appropriate.     Data Reviewed:   CBC: Recent Labs  Lab 09/22/18 1956  09/22/18 2006  WBC 13.8*  --   NEUTROABS 10.8*  --   HGB 13.3 13.9  HCT 42.6 41.0  MCV 98.8  --   PLT 245  --    Basic Metabolic Panel: Recent Labs  Lab 09/22/18 1956 09/22/18 2006  NA 138 137  K 4.1 4.0  CL 103 103  CO2 24  --   GLUCOSE 133* 134*  BUN 23 22  CREATININE 0.74 0.70  CALCIUM 8.9  --    GFR: Estimated Creatinine Clearance: 49 mL/min (by C-G formula based on SCr of 0.7 mg/dL). Liver Function Tests: No results for input(s): AST, ALT, ALKPHOS, BILITOT, PROT, ALBUMIN in the last 168 hours. No results for input(s): LIPASE, AMYLASE in the last 168 hours. No results for input(s): AMMONIA in the last 168 hours. Coagulation Profile: No results for input(s): INR, PROTIME in the last 168 hours. Cardiac Enzymes: No results for input(s): CKTOTAL, CKMB, CKMBINDEX, TROPONINI in the last 168 hours. BNP (last 3 results) Recent Labs    04/03/18 0926  PROBNP 120.0*   HbA1C: No results for input(s): HGBA1C in the last 72 hours. CBG: No results for input(s): GLUCAP in the last 168 hours. Lipid Profile: No results for input(s): CHOL, HDL, LDLCALC, TRIG, CHOLHDL, LDLDIRECT in the last 72 hours. Thyroid Function Tests: Recent Labs    09/23/18 0541  TSH 0.141*   Anemia Panel: No results for input(s): VITAMINB12, FOLATE, FERRITIN, TIBC, IRON, RETICCTPCT in the last 72 hours. Sepsis Labs: Recent Labs  Lab 09/23/18 0839  PROCALCITON 0.34    No results found for this or any previous visit (from the past 240 hour(s)).       Radiology Studies: Dg Chest 2 View  Result Date: 09/22/2018 CLINICAL DATA:  Shortness of breath, history pulmonary fibrosis, hypertension EXAM: CHEST - 2 VIEW COMPARISON:  07/05/2018 FINDINGS: Enlargement of cardiac silhouette. Mediastinal contour stable. Severe diffuse BILATERAL chronic lung opacities which appear more confluent than on the previous exam. The suggests a combination of chronic interstitial disease/fibrosis and superimposed  acute infiltrates. No pleural effusion or pneumothorax. Bones demineralized. IMPRESSION: Increased pulmonary opacities bilaterally question superimposed infiltrate upon a background of pulmonary fibrosis. Electronically Signed   By: Ulyses SouthwardMark  Boles M.D.   On: 09/22/2018 19:55        Scheduled Meds: . azelastine  2 spray Each Nare BID  . azithromycin  250 mg Oral Daily  . diltiazem  60 mg Oral Q12H  . diphenhydrAMINE  25 mg Oral Q6H  . enoxaparin (LOVENOX) injection  60 mg Subcutaneous BID  . ipratropium-albuterol  3 mL Nebulization QID  . latanoprost  1 drop Both Eyes QHS  . mouth rinse  15 mL Mouth Rinse BID  . methylPREDNISolone (SOLU-MEDROL) injection  80 mg Intravenous Q12H  . montelukast  10 mg Oral QHS   Continuous Infusions: . cefTRIAXone (ROCEPHIN)  IV       LOS: 1 day   Time spent= 40 mins    Gerod Caligiuri Joline Maxcyhirag Channie Bostick, MD [facility] Pager [phone]   If 7PM-7AM, please contact night-coverage [email or web address] Password [email or web address] 09/23/2018, 2:44 PM

## 2018-09-23 NOTE — Progress Notes (Signed)
  Echocardiogram 2D Echocardiogram has been performed.  Casey Freeman L Androw 09/23/2018, 10:24 AM

## 2018-09-23 NOTE — Plan of Care (Signed)

## 2018-09-24 ENCOUNTER — Encounter (HOSPITAL_COMMUNITY): Payer: Self-pay | Admitting: *Deleted

## 2018-09-24 DIAGNOSIS — J84112 Idiopathic pulmonary fibrosis: Secondary | ICD-10-CM

## 2018-09-24 DIAGNOSIS — J9621 Acute and chronic respiratory failure with hypoxia: Secondary | ICD-10-CM

## 2018-09-24 DIAGNOSIS — J704 Drug-induced interstitial lung disorders, unspecified: Secondary | ICD-10-CM

## 2018-09-24 DIAGNOSIS — R0603 Acute respiratory distress: Secondary | ICD-10-CM

## 2018-09-24 LAB — T3: T3, Total: 73 ng/dL (ref 71–180)

## 2018-09-24 LAB — BASIC METABOLIC PANEL
Anion gap: 10 (ref 5–15)
BUN: 30 mg/dL — AB (ref 8–23)
CO2: 26 mmol/L (ref 22–32)
Calcium: 9.1 mg/dL (ref 8.9–10.3)
Chloride: 103 mmol/L (ref 98–111)
Creatinine, Ser: 0.85 mg/dL (ref 0.44–1.00)
GFR calc Af Amer: >60 mL/min (ref >60)
GFR calc non Af Amer: >60 mL/min (ref >60)
GLUCOSE: 176 mg/dL — AB (ref 70–99)
Potassium: 5 mmol/L (ref 3.5–5.1)
Sodium: 139 mmol/L (ref 135–145)

## 2018-09-24 LAB — MAGNESIUM: Magnesium: 2.5 mg/dL — ABNORMAL HIGH (ref 1.7–2.4)

## 2018-09-24 MED ORDER — METHYLPREDNISOLONE SODIUM SUCC 40 MG IJ SOLR
40.0000 mg | Freq: Four times a day (QID) | INTRAMUSCULAR | Status: DC
  Administered 2018-09-24 – 2018-09-27 (×12): 40 mg via INTRAVENOUS
  Filled 2018-09-24 (×13): qty 1

## 2018-09-24 MED ORDER — ACETAMINOPHEN 325 MG PO TABS
650.0000 mg | ORAL_TABLET | Freq: Four times a day (QID) | ORAL | Status: DC | PRN
  Administered 2018-09-24 – 2018-09-25 (×3): 650 mg via ORAL
  Filled 2018-09-24 (×3): qty 2

## 2018-09-24 MED ORDER — ENSURE MAX PROTEIN PO LIQD
11.0000 [oz_av] | Freq: Every day | ORAL | Status: DC
  Administered 2018-09-24 – 2018-09-27 (×2): 11 [oz_av] via ORAL
  Filled 2018-09-24 (×8): qty 330

## 2018-09-24 MED ORDER — ALUM & MAG HYDROXIDE-SIMETH 200-200-20 MG/5ML PO SUSP
30.0000 mL | ORAL | Status: DC | PRN
  Administered 2018-09-24: 30 mL via ORAL
  Filled 2018-09-24: qty 30

## 2018-09-24 NOTE — Care Management Note (Signed)
Case Management Note  Patient Details  Name: Casey Freeman MRN: [id number] Date of Birth: Mar 12, 1936  Subjective/Objective:  Dyspnea. From ALF-[facility].On chronic 02. PT cons-await recc.                  Action/Plan:d/c back to ALF   Expected Discharge Date:                  Expected Discharge Plan:  Assisted Living / Rest Home  In-House Referral:  Clinical Social Work  Discharge planning Services  CM Consult  Post Acute Care Choice:  Durable Medical Equipment(chronic 02 3lnc) Choice offered to:     DME Arranged:    DME Agency:     HH Arranged:    HH Agency:     Status of Service:  In process, will continue to follow  If discussed at Long Length of Stay Meetings, dates discussed:    Additional Comments:  Casey Freeman, Casey Hatcher, RN 09/24/2018, 1:34 PM

## 2018-09-24 NOTE — Progress Notes (Signed)
PROGRESS NOTE    Casey Freeman  EXB:[id number] DOB: [birth year] DOA: 09/22/2018 PCP: Johny BlamerHarris, William, MD   Brief Narrative:  82 year old with history of interstitial pulmonary fibrosis, chronic hypoxia on 3-4 L nasal cannula, essential hypertension came from assisted living [facility] with complains of acute shortness of breath and chest heaviness.  She was diagnosed with likely acute flare of her pulmonary fibrosis and started on IV azithromycin and Rocephin.  Despite a steroid aggressive nebulizer use, she has not been improving much.  Pulmonary consulted.   Assessment & Plan:   Principal Problem:   Dyspnea Active Problems:   OA (osteoarthritis) of knee   Cough   Essential hypertension   Drug-induced diffuse interstitial pulmonary fibrosis (HCC)   Chronic respiratory failure with hypoxia (HCC)   Chest pain, musculoskeletal   Acute respiratory distress secondary to pulmonary fibrosis flare -Minimal improvement, still requiring 4-5 L.  Significantly gets short of breath even moving around in the bed. - On Rocephin and azithromycin -Procalcitonin 0.34 -Nebulizer treatments -Continue Pulmicort - Solu-Medrol 40 mg change to every 6 hours. -We will consult pulmonary for their assistance. - Consider palliative care consult as well.  Paroxysmal atrial fibrillation, CHADsVASc 4 Atypical chest pain - Suspect secondary to underlying pulmonary pathology -Echocardiogram- ejection fraction 40-50%, mild aortic stenosis.  PA pressures 69. - Cardizem sustained release 60 mg twice daily - On Lovenox 60 mg twice daily, will eventually switch this to Eliquis. -Check EKG.  Patient would not want any aggressive measures at this time including stress test or Cath  Essential hypertension - On Cardizem 60 mg twice daily  Chronic back pain and anxiety -Pain control as needed  Glaucoma -Continue home meds  DVT prophylaxis: 60 mg twice daily with plans to transition to Eliquis Code  Status: DNR Family Communication: Daughter at bedside Disposition Plan: Patient is still significantly short of breath will require at least another 3 to 4 days of hospital stay  Consultants:   Pulmonary  Procedures:   None  Antimicrobials:   Azithromycin  Rocephin   Subjective: Significant shortness of breath even with minimal ambulation.  Minimal improvement from yesterday.  She tells me she is not even able to go to the bedside commode without getting short of breath.  Review of Systems Otherwise negative except as per HPI, including: General = no fevers, chills, dizziness, malaise, fatigue HEENT/EYES = negative for pain, redness, loss of vision, double vision, blurred vision, loss of hearing, sore throat, hoarseness, dysphagia Cardiovascular= negative for chest pain, palpitation, murmurs, lower extremity swelling Respiratory/lungs= negative forcough, hemoptysis, wheezing, mucus production Gastrointestinal= negative for nausea, vomiting,, abdominal pain, melena, hematemesis Genitourinary= negative for Dysuria, Hematuria, Change in Urinary Frequency MSK = Negative for arthralgia, myalgias, Back Pain, Joint swelling  Neurology= Negative for headache, seizures, numbness, tingling  Psychiatry= Negative for anxiety, depression, suicidal and homocidal ideation Allergy/Immunology= Medication/Food allergy as listed  Skin= Negative for Rash, lesions, ulcers, itching   Objective: Vitals:   09/23/18 1945 09/23/18 2040 09/24/18 0536 09/24/18 1022  BP:  114/64 110/66   Pulse:  (!) 102 88   Resp:  20 20   Temp:  97.9 F (36.6 C) 98 F (36.7 C)   TempSrc:  Oral Oral   SpO2: 93% 93% 94% 93%  Weight:      Height:        Intake/Output Summary (Last 24 hours) at 09/24/2018 1042 Last data filed at 09/24/2018 0841 Gross per 24 hour  Intake 460 ml  Output -  Net 460 ml   Filed Weights   09/22/18 2323  Weight: 63.2 kg    Examination:  Constitutional: NAD, calm,  comfortable, on 3-4 L nasal cannula. Eyes: PERRL, lids and conjunctivae normal ENMT: Mucous membranes are moist. Posterior pharynx clear of any exudate or lesions.Normal dentition.  Neck: normal, supple, no masses, no thyromegaly Respiratory: Diffuse expiratory wheezing.  Unable to speak with me in full sentences without getting short of breath. Cardiovascular: Regular rate and rhythm, no murmurs / rubs / gallops. No extremity edema. 2+ pedal pulses. No carotid bruits.  Abdomen: no tenderness, no masses palpated. No hepatosplenomegaly. Bowel sounds positive.  Musculoskeletal: no clubbing / cyanosis. No joint deformity upper and lower extremities. Good ROM, no contractures. Normal muscle tone.  Skin: no rashes, lesions, ulcers. No induration Neurologic: CN 2-12 grossly intact. Sensation intact, DTR normal. Strength 5/5 in all 4.  Psychiatric: Normal judgment and insight. Alert and oriented x 3. Normal mood.     Data Reviewed:   CBC: Recent Labs  Lab 09/22/18 1956 09/22/18 2006  WBC 13.8*  --   NEUTROABS 10.8*  --   HGB 13.3 13.9  HCT 42.6 41.0  MCV 98.8  --   PLT 245  --    Basic Metabolic Panel: Recent Labs  Lab 09/22/18 1956 09/22/18 2006 09/24/18 0437  NA 138 137 139  K 4.1 4.0 5.0  CL 103 103 103  CO2 24  --  26  GLUCOSE 133* 134* 176*  BUN 23 22 30*  CREATININE 0.74 0.70 0.85  CALCIUM 8.9  --  9.1  MG  --   --  2.5*   GFR: Estimated Creatinine Clearance: 46.1 mL/min (by C-G formula based on SCr of 0.85 mg/dL). Liver Function Tests: No results for input(s): AST, ALT, ALKPHOS, BILITOT, PROT, ALBUMIN in the last 168 hours. No results for input(s): LIPASE, AMYLASE in the last 168 hours. No results for input(s): AMMONIA in the last 168 hours. Coagulation Profile: No results for input(s): INR, PROTIME in the last 168 hours. Cardiac Enzymes: No results for input(s): CKTOTAL, CKMB, CKMBINDEX, TROPONINI in the last 168 hours. BNP (last 3 results) Recent Labs     04/03/18 0926  PROBNP 120.0*   HbA1C: No results for input(s): HGBA1C in the last 72 hours. CBG: No results for input(s): GLUCAP in the last 168 hours. Lipid Profile: No results for input(s): CHOL, HDL, LDLCALC, TRIG, CHOLHDL, LDLDIRECT in the last 72 hours. Thyroid Function Tests: Recent Labs    09/23/18 0541 09/23/18 0839  TSH 0.141*  --   FREET4  --  1.22   Anemia Panel: No results for input(s): VITAMINB12, FOLATE, FERRITIN, TIBC, IRON, RETICCTPCT in the last 72 hours. Sepsis Labs: Recent Labs  Lab 09/23/18 0839  PROCALCITON 0.34    No results found for this or any previous visit (from the past 240 hour(s)).       Radiology Studies: Dg Chest 2 View  Result Date: 09/22/2018 CLINICAL DATA:  Shortness of breath, history pulmonary fibrosis, hypertension EXAM: CHEST - 2 VIEW COMPARISON:  07/05/2018 FINDINGS: Enlargement of cardiac silhouette. Mediastinal contour stable. Severe diffuse BILATERAL chronic lung opacities which appear more confluent than on the previous exam. The suggests a combination of chronic interstitial disease/fibrosis and superimposed acute infiltrates. No pleural effusion or pneumothorax. Bones demineralized. IMPRESSION: Increased pulmonary opacities bilaterally question superimposed infiltrate upon a background of pulmonary fibrosis. Electronically Signed   By: Ulyses SouthwardMark  Boles M.D.   On: 09/22/2018 19:55  Scheduled Meds: . azelastine  2 spray Each Nare BID  . azithromycin  250 mg Oral Daily  . budesonide (PULMICORT) nebulizer solution  0.5 mg Nebulization BID  . diltiazem  60 mg Oral Q12H  . diphenhydrAMINE  25 mg Oral Q6H  . enoxaparin (LOVENOX) injection  60 mg Subcutaneous BID  . ipratropium-albuterol  3 mL Nebulization QID  . latanoprost  1 drop Both Eyes QHS  . mouth rinse  15 mL Mouth Rinse BID  . methylPREDNISolone (SOLU-MEDROL) injection  40 mg Intravenous Q8H  . montelukast  10 mg Oral QHS   Continuous Infusions: . cefTRIAXone  (ROCEPHIN)  IV Stopped (09/23/18 1806)     LOS: 2 days   Time spent= 45 mins    Jamia Hoban Joline Maxcy, MD [facility] Pager [phone]   If 7PM-7AM, please contact night-coverage [email or web address] Password [facility] 09/24/2018, 10:42 AM

## 2018-09-24 NOTE — Plan of Care (Signed)

## 2018-09-24 NOTE — Evaluation (Signed)
Physical Therapy Evaluation Patient Details Name: Casey Freeman MRN: [id number] DOB: Oct 27, 1935 Today's Date: 09/24/2018   History of Present Illness  82 yo female admitted with dyspnea, hypoxia, pulmonary fibrosis exacerbation, A fib. Hx of pulm fibrosis, chronic back pain, R TKA 2013  Clinical Impression  On eval, pt required Min assist for mobility. Pt became hypoxic and dyspneic with minimal activity. Multiple rest breaks of at least 3-4 minutes, or longer, required between tasks. O2 sat reading: at rest-90% 4L [place]; with activity-72% on 4L, 82% on 6L; end of session-88-89% 4L. Discussed d/c plan with pt and family-plan is for return to ALF. Will recommend HHPT f/u. Will follow and progress activity as tolerated.     Follow Up Recommendations Home health PT;Supervision/Assistance - 24 hour    Equipment Recommendations  None recommended by PT    Recommendations for Other Services       Precautions / Restrictions Precautions Precautions: Fall Precaution Comments: O2 dep at baseline. Monitor O2 Restrictions Weight Bearing Restrictions: No      Mobility  Bed Mobility Overal bed mobility: Needs Assistance Bed Mobility: Supine to Sit     Supine to sit: Supervision     General bed mobility comments: for safety, lines, monitoring O2 levels.   Transfers Overall transfer level: Needs assistance Equipment used: Rolling walker (2 wheeled) Transfers: Sit to/from [employer]GI CorporationStand;Stand Pivot Transfers Sit to Stand: Min assist Stand pivot transfers: Min assist       General transfer comment: Assist to rise, stabilize, control descent. VCs safety, hand placement. Extended rest break of at least 3-4 mintues required before pt could stand and pivot. Stand pivot, bed to recliner, with RW  Ambulation/Gait             General Gait Details: NT-pt not able to attempt due to cardiopulmonary issues  Stairs            Wheelchair Mobility    Modified Rankin (Stroke Patients Only)       Balance Overall balance assessment: Needs assistance         Standing balance support: Bilateral upper extremity supported Standing balance-Leahy Scale: Poor                               Pertinent Vitals/Pain Pain Assessment: No/denies pain    Home Living Family/patient expects to be discharged to:: Assisted living               Home Equipment: Walker - 2 wheels;Walker - 4 wheels;Cane - single point;Shower seat      Prior Function Level of Independence: Independent with assistive device(s)         Comments: walking short distances using a walker. Requires WC for longer distances.      Hand Dominance        Extremity/Trunk Assessment   Upper Extremity Assessment Upper Extremity Assessment: Generalized weakness    Lower Extremity Assessment Lower Extremity Assessment: Generalized weakness    Cervical / Trunk Assessment Cervical / Trunk Assessment: Kyphotic  Communication   Communication: No difficulties  Cognition Arousal/Alertness: Awake/alert Behavior During Therapy: WFL for tasks assessed/performed Overall Cognitive Status: Within Functional Limits for tasks assessed                                        General Comments      Exercises  Assessment/Plan    PT Assessment Patient needs continued PT services  PT Problem List Decreased strength;Decreased balance;Decreased mobility;Decreased activity tolerance;Cardiopulmonary status limiting activity       PT Treatment Interventions DME instruction;Gait training;Functional mobility training;Therapeutic activities;Balance training;Patient/family education;Therapeutic exercise    PT Goals (Current goals can be found in the Care Plan section)  Acute Rehab PT Goals Patient Stated Goal: to breathe better  PT Goal Formulation: With patient/family Time For Goal Achievement: 10/08/18 Potential to Achieve Goals: Fair    Frequency Min 3X/week   Barriers to  discharge        Co-evaluation               AM-PAC PT "6 Clicks" Mobility  Outcome Measure Help needed turning from your back to your side while in a flat bed without using bedrails?: None Help needed moving from lying on your back to sitting on the side of a flat bed without using bedrails?: None Help needed moving to and from a bed to a chair (including a wheelchair)?: A Little Help needed standing up from a chair using your arms (e.g., wheelchair or bedside chair)?: A Little Help needed to walk in hospital room?: A Little Help needed climbing 3-5 steps with a railing? : A Little 6 Click Score: 20    End of Session Equipment Utilized During Treatment: Oxygen Activity Tolerance: Patient limited by fatigue(limited by hypoxia, dyspnea) Patient left: in chair;with call bell/phone within reach;with family/visitor present   PT Visit Diagnosis: Muscle weakness (generalized) (M62.81);Unsteadiness on feet (R26.81);Difficulty in walking, not elsewhere classified (R26.2)    Time: 1532-1600 PT Time Calculation (min) (ACUTE ONLY): 28 min   Charges:   PT Evaluation $PT Eval Moderate Complexity: 1 Mod PT Treatments $Therapeutic Activity: 8-22 mins         Rebeca AlertJannie Sulo Janczak, PT Acute Rehabilitation Services Pager: [phone] Office: [phone]

## 2018-09-24 NOTE — Progress Notes (Signed)
Initial Nutrition Assessment  INTERVENTION:   Provide Ensure Max daily, each provides 150 kcal and 30g protein.  NUTRITION DIAGNOSIS:   Increased nutrient needs related to (pulmonary fibrosis) as evidenced by estimated needs.  GOAL:   Patient will meet greater than or equal to 90% of their needs  MONITOR:   PO intake, Supplement acceptance, Labs, Weight trends, I & O's  REASON FOR ASSESSMENT:   Malnutrition Screening Tool    ASSESSMENT:   82 year old with history of interstitial pulmonary fibrosis, chronic hypoxia on 3-4 L nasal cannula, essential hypertension came from assisted living [facility] with complains of acute shortness of breath and chest heaviness.  Patient with good appetite and eating 75-100% of all meals. Pt continues to have SOB. Given respiratory status and significant weight loss below, will order Ensure Max supplements to provide additional protein in diet.  Per weight history, pt has lost 30 lb since 4/2 (18% wt loss x 7.5 months, significant for time frame).   Medications reviewed. Labs reviewed: Elevated Mg  NUTRITION - FOCUSED PHYSICAL EXAM:    Most Recent Value  Orbital Region  No depletion  Upper Arm Region  No depletion  Thoracic and Lumbar Region  Unable to assess  Buccal Region  Mild depletion  Temple Region  Mild depletion  Clavicle Bone Region  Mild depletion  Clavicle and Acromion Bone Region  Mild depletion  Scapular Bone Region  Unable to assess  Dorsal Hand  No depletion  Patellar Region  Unable to assess  Anterior Thigh Region  Unable to assess  Posterior Calf Region  Unable to assess  Edema (RD Assessment)  None       Diet Order:   Diet Order            Diet regular Room service appropriate? Yes; Fluid consistency: Thin  Diet effective now              EDUCATION NEEDS:   No education needs have been identified at this time  Skin:  Skin Assessment: Reviewed RN Assessment  Last BM:  12/22  Height:   Ht  Readings from Last 1 Encounters:  09/22/18 5\' 2"  (1.575 m)    Weight:   Wt Readings from Last 1 Encounters:  09/22/18 63.2 kg    Ideal Body Weight:  50 kg  BMI:  Body mass index is 25.48 kg/m.  Estimated Nutritional Needs:   Kcal:  1700-1900  Protein:  75-85g  Fluid:  1.8L/day   Tilda FrancoLindsey Miriam Kestler, MS, RD, LDN [facility] Clinical Dietitian Pager: [phone] After Hours Pager: [phone]

## 2018-09-24 NOTE — Consult Note (Signed)
NAME:  Casey Freeman, MRN:  [id number], DOB:  07-17-1936, LOS: 2 ADMISSION DATE:  09/22/2018, CONSULTATION DATE:  09/24/2018 REFERRING MD:  TRH-Amin, CHIEF COMPLAINT:  IPF   Brief History   82 year old female with PMH of IPF that has been stable for years on 4L [place] and no need for steroids who follows with Dr. Sherene SiresWert as outpatient.  For 2 wks, the patient has noticed worsening SOB but never measured her O2 sat.  She finally called EMS on 12/21 when she increased her O2 to 5L (max on her concentrator) but had no improvement and felt that she can not move from her bathroom to her chair.  Denied fever, cough or sputum production that is different from her normal.  She is becoming more hypoxemic while speaking and was providing limited history.  Has has been on abx (bactrim for a UTI) in the last 3 months and no steroid taper for 3 years.  History of present illness   82 year old female with PMH of IPF that has been stable for years on 4L [place] and no need for steroids who follows with Dr. Sherene SiresWert as outpatient.  For 2 wks, the patient has noticed worsening SOB but never measured her O2 sat.  She finally called EMS on 12/21 when she increased her O2 to 5L (max on her concentrator) but had no improvement and felt that she can not move from her bathroom to her chair.  Denied fever, cough or sputum production that is different from her normal.  She is becoming more hypoxemic while speaking and was providing limited history.  Has not has been on abx (bactrim for a UTI) in the last 3 months and no steroid taper for 3 years.  Past Medical History  IPF on 4L [place] at home  Significant Hospital Events   12/23 PCCM called on consultation for failure to improve  Consults:  PCCM  Procedures:  None  Significant Diagnostic Tests:  CXR that I reviewed myself with significant fibrotic changes on both lungs  Micro Data:  Cultures None  Antimicrobials:  Rocephin 12/21>>> Zithromax 12/21>>>  Interim  history/subjective:  SOB with cough and wheezing  Objective   Blood pressure 110/66, pulse 88, temperature 98 F (36.7 C), temperature source Oral, resp. rate 20, height 5\' 2"  (1.575 m), weight 63.2 kg, SpO2 93 %.        Intake/Output Summary (Last 24 hours) at 09/24/2018 1148 Last data filed at 09/24/2018 0841 Gross per 24 hour  Intake 460 ml  Output -  Net 460 ml   Filed Weights   09/22/18 2323  Weight: 63.2 kg   Examination: General: Acutely SOB appearing female on 4L [place], SOB HENT: [place]/AT, PERRL, EOM-I and MMM Lungs: Diffuse wheezes and bibasilar crackles Cardiovascular: RRR, Nl S1/S2 and -M/R/G Abdomen: Soft, NT, ND and +BS Extremities: -edema and -tenderness Neuro: Alert and interactive, moving all ext to command Skin: Intact  Resolved Hospital Problem list   N/A  Assessment & Plan:  82 year old female with IPF history who presents to PCCM with acute on chronic respiratory failure due to IPF and wheezing, no history or proof of obstructive lung disease.  Discussed with PCCM-NP.  Acute on chronic respiratory failure with hypoxemia: - Titrate O2 for sat of 88-92% - Will need a home concentrator that would increase to 10L prior to discharge - Monitor for signs of aspiration as that will worsen IPF - Anticipate will need a new baseline O2 level  prior to discharge  Wheezing - Solumedrol 40 mg IV q6 hours acceptable - Add albuterol PRN - Continue duonebs - Pulmicort - Will need PFTs as outpatient  Allergic rhinitis: - Singular - Benadryl  PCCM will follow  Labs   CBC: Recent Labs  Lab 09/22/18 1956 09/22/18 2006  WBC 13.8*  --   NEUTROABS 10.8*  --   HGB 13.3 13.9  HCT 42.6 41.0  MCV 98.8  --   PLT 245  --     Basic Metabolic Panel: Recent Labs  Lab 09/22/18 1956 09/22/18 2006 09/24/18 0437  NA 138 137 139  K 4.1 4.0 5.0  CL 103 103 103  CO2 24  --  26  GLUCOSE 133* 134* 176*  BUN 23 22 30*  CREATININE 0.74 0.70 0.85  CALCIUM 8.9  --   9.1  MG  --   --  2.5*   GFR: Estimated Creatinine Clearance: 46.1 mL/min (by C-G formula based on SCr of 0.85 mg/dL). Recent Labs  Lab 09/22/18 1956 09/23/18 0839  PROCALCITON  --  0.34  WBC 13.8*  --     Liver Function Tests: No results for input(s): AST, ALT, ALKPHOS, BILITOT, PROT, ALBUMIN in the last 168 hours. No results for input(s): LIPASE, AMYLASE in the last 168 hours. No results for input(s): AMMONIA in the last 168 hours.  ABG    Component Value Date/Time   TCO2 27 09/22/2018 2006     Coagulation Profile: No results for input(s): INR, PROTIME in the last 168 hours.  Cardiac Enzymes: No results for input(s): CKTOTAL, CKMB, CKMBINDEX, TROPONINI in the last 168 hours.  HbA1C: No results found for: HGBA1C  CBG: No results for input(s): GLUCAP in the last 168 hours.  Review of Systems:     Past Medical History  She,  has a past medical history of Arthritis, Drug-induced diffuse interstitial pulmonary fibrosis (HCC) (12/23/2013), UTI (urinary tract infection), Hypertension, and PONV (postoperative nausea and vomiting).   Surgical History    Past Surgical History:  Procedure Laterality Date  . ABDOMINAL HYSTERECTOMY    . cataracts    . POLYPECTOMY  30 YRS AGO   VOCAL CORDS  . ROTATOR CUFF REPAIR     RT  . SHOULDER SURGERY     LEFT  . TOTAL KNEE ARTHROPLASTY  05/01/2012   Procedure: TOTAL KNEE ARTHROPLASTY;  Surgeon: Loanne Drilling, MD;  Location: [facility] ORS;  Service: Orthopedics;  Laterality: Right;     Social History   reports that she has never smoked. She has never used smokeless tobacco. She reports that she does not drink alcohol or use drugs.   Family History   Her family history is negative for Breast cancer.   Allergies Allergies  Allergen Reactions  . Doxycycline Other (See Comments)    Sore throat   . Hydrocodone     Makes her dizzy  . Macrodantin [Nitrofurantoin Macrocrystal]     Lung Dz  . Simvastatin Rash     Home Medications    Prior to Admission medications   Medication Sig Start Date End Date Taking? Authorizing Provider  acetaminophen (TYLENOL) 325 MG tablet Take 650 mg by mouth every 12 (twelve) hours as needed for mild pain, moderate pain or headache.   Yes [provider]  albuterol (PROAIR HFA) 108 (90 Base) MCG/ACT inhaler Inhale 2 puffs into the lungs every 4 (four) hours as needed for wheezing or shortness of breath. 12/11/15  Yes Parrett, Virgel Bouquet, NP  aspirin EC 81 MG tablet Take 81 mg by mouth daily.   Yes [provider]  Azelastine HCl 0.15 % SOLN Place 2 sprays into both nostrils 2 (two) times daily.  11/10/16  Yes [provider]  budesonide-formoterol (SYMBICORT) 80-4.5 MCG/ACT inhaler Inhale 2 puffs into the lungs 2 (two) times daily. 04/03/18  Yes Nyoka CowdenWert, Michael B, MD  chlorpheniramine (CHLOR-TRIMETON) 4 MG tablet Take 1-2 tablets by mouth every 4 hours as needed for drippy nose, drainage, and throat clearing   Yes [provider]  famotidine (PEPCID) 20 MG tablet Take 20 mg by mouth at bedtime.    Yes [provider]  furosemide (LASIX) 40 MG tablet Take 1 tablet (40 mg total) by mouth daily as needed for edema. 07/05/18  Yes Nyoka CowdenWert, Michael B, MD  hydrochlorothiazide (HYDRODIURIL) 25 MG tablet Take 25 mg by mouth daily as needed (swelling).    Yes [provider]  LORazepam (ATIVAN) 0.5 MG tablet Take 0.5 mg by mouth every 12 (twelve) hours as needed for anxiety.    Yes [provider]  montelukast (SINGULAIR) 10 MG tablet Take 10 mg by mouth at bedtime.   Yes [provider]  OXYGEN 2 L/min continuous.    Yes [provider]  pantoprazole (PROTONIX) 40 MG tablet Take 1 tablet (40 mg total) by mouth daily before breakfast. 08/31/18  Yes Nyoka CowdenWert, Michael B, MD  predniSONE (DELTASONE) 10 MG tablet Take 10 mg by mouth daily with breakfast.    Yes [provider]  Respiratory Therapy Supplies (FLUTTER) DEVI Use as directed  07/05/18  Yes Nyoka CowdenWert, Michael B, MD  sulfamethoxazole-trimethoprim (BACTRIM DS,SEPTRA DS) 800-160 MG tablet Take 1 tablet by mouth 2 (two) times daily.   Yes [provider]  traMADol (ULTRAM) 50 MG tablet Take 50 mg by mouth every 6 (six) hours as needed for moderate pain or severe pain.    Yes [provider]  TRAVATAN Z 0.004 % SOLN ophthalmic solution Apply 1 drop to eye at bedtime. 02/27/15  Yes [provider]    Alyson ReedyWesam G. Jennavecia Schwier, M.D. [facility]. Pager: [phone]. After hours pager: [phone].

## 2018-09-24 NOTE — Care Management Note (Signed)
Case Management Note  Patient Details  Name: Casey Freeman MRN: [id number] Date of Birth: 1936/01/14  Subjective/Objective: CM spoke to patient about medication concerns-she wants all of her meds coordinated @ d/c so the ALF-[facility] can administer them-Nurse/MD/CSW notified of patient's concerns. PT cons-await recc. Already on home 02-[facility]-has travel tank.Son will transport back to ALF @ d/c.                   Action/Plan:dc plan ALF   Expected Discharge Date:                  Expected Discharge Plan:  Assisted Living / Rest Home  In-House Referral:  Clinical Social Work  Discharge planning Services  CM Consult  Post Acute Care Choice:  Durable Medical Equipment(chronic 02 3lnc-[facility]-has travel tank) Choice offered to:     DME Arranged:    DME Agency:     HH Arranged:    HH Agency:     Status of Service:  In process, will continue to follow  If discussed at Long Length of Stay Meetings, dates discussed:    Additional Comments:  Lanier ClamMahabir, Li Bobo, RN 09/24/2018, 1:59 PM

## 2018-09-25 ENCOUNTER — Inpatient Hospital Stay (HOSPITAL_COMMUNITY): Payer: Medicare Other

## 2018-09-25 DIAGNOSIS — J9611 Chronic respiratory failure with hypoxia: Secondary | ICD-10-CM

## 2018-09-25 DIAGNOSIS — Z515 Encounter for palliative care: Secondary | ICD-10-CM

## 2018-09-25 DIAGNOSIS — Z7189 Other specified counseling: Secondary | ICD-10-CM

## 2018-09-25 DIAGNOSIS — R0602 Shortness of breath: Secondary | ICD-10-CM

## 2018-09-25 LAB — BASIC METABOLIC PANEL
Anion gap: 10 (ref 5–15)
BUN: 29 mg/dL — ABNORMAL HIGH (ref 8–23)
CO2: 26 mmol/L (ref 22–32)
Calcium: 9 mg/dL (ref 8.9–10.3)
Chloride: 105 mmol/L (ref 98–111)
Creatinine, Ser: 0.63 mg/dL (ref 0.44–1.00)
GFR calc non Af Amer: >60 mL/min (ref >60)
Glucose, Bld: 166 mg/dL — ABNORMAL HIGH (ref 70–99)
Potassium: 4.5 mmol/L (ref 3.5–5.1)
Sodium: 141 mmol/L (ref 135–145)

## 2018-09-25 LAB — URINALYSIS, ROUTINE W REFLEX MICROSCOPIC
Bacteria, UA: NONE SEEN
Bilirubin Urine: NEGATIVE
Glucose, UA: NEGATIVE mg/dL
Hgb urine dipstick: NEGATIVE
Ketones, ur: NEGATIVE mg/dL
Nitrite: NEGATIVE
Protein, ur: NEGATIVE mg/dL
Specific Gravity, Urine: 1.006 (ref 1.005–1.030)
pH: 7 (ref 5.0–8.0)

## 2018-09-25 LAB — MAGNESIUM: Magnesium: 2.2 mg/dL (ref 1.7–2.4)

## 2018-09-25 MED ORDER — FAMOTIDINE 20 MG PO TABS
20.0000 mg | ORAL_TABLET | Freq: Every day | ORAL | Status: DC
  Administered 2018-09-25 – 2018-09-30 (×6): 20 mg via ORAL
  Filled 2018-09-25 (×6): qty 1

## 2018-09-25 MED ORDER — PANTOPRAZOLE SODIUM 40 MG PO TBEC
40.0000 mg | DELAYED_RELEASE_TABLET | Freq: Every day | ORAL | Status: DC
  Administered 2018-09-25 – 2018-10-01 (×7): 40 mg via ORAL
  Filled 2018-09-25 (×7): qty 1

## 2018-09-25 MED ORDER — FUROSEMIDE 10 MG/ML IJ SOLN
40.0000 mg | Freq: Three times a day (TID) | INTRAMUSCULAR | Status: AC
  Administered 2018-09-25 (×2): 40 mg via INTRAVENOUS
  Filled 2018-09-25 (×2): qty 4

## 2018-09-25 NOTE — Progress Notes (Signed)
Noted for increase level of home 02-[facility]-will fax orders to fax#[phone] once orders are in for home 02,along with 02 sats-MD notified.

## 2018-09-25 NOTE — Progress Notes (Addendum)
PT Cancellation Note  Patient Details Name: Casey Freeman MRN: [id number] DOB: 06-13-1936   Cancelled Treatment:    Reason Eval/Treat Not Completed: Medical issues which prohibited therapy. Patient noted SOB, sats 85% on 6 L HFNC and HR 120's , resting  In bed. Patient reports taking  Lasix and relying on Purewick due to DOE.    Rada HayHill, Jiovanna Frei [name] 09/25/2018, 2:28 PM  Blanchard KelchKaren Purvis Sidle PT Acute Rehabilitation Services Pager [phone] Office [phone]

## 2018-09-25 NOTE — Consult Note (Signed)
NAME:  Casey Freeman, MRN:  [id number], DOB:  [birth year], LOS: 3 ADMISSION DATE:  09/22/2018, CONSULTATION DATE:  09/24/2018 REFERRING MD:  TRH-Amin, CHIEF COMPLAINT:  IPF   Brief History   82 year old female with PMH of IPF that has been stable for years on 4L [place] and no need for steroids who follows with Dr. Sherene Sires as outpatient.  For 2 wks, the patient has noticed worsening SOB but never measured her O2 sat.  She finally called EMS on 12/21 when she increased her O2 to 5L (max on her concentrator) but had no improvement and felt that she can not move from her bathroom to her chair.  Denied fever, cough or sputum production that is different from her normal.  She is becoming more hypoxemic while speaking and was providing limited history.  Has has been on abx (bactrim for a UTI) in the last 3 months and no steroid taper for 3 years.  History of present illness   82 year old female with PMH of IPF that has been stable for years on 4L [place] and no need for steroids who follows with Dr. Sherene Sires as outpatient.  For 2 wks, the patient has noticed worsening SOB but never measured her O2 sat.  She finally called EMS on 12/21 when she increased her O2 to 5L (max on her concentrator) but had no improvement and felt that she can not move from her bathroom to her chair.  Denied fever, cough or sputum production that is different from her normal.  She is becoming more hypoxemic while speaking and was providing limited history.  Has not has been on abx (bactrim for a UTI) in the last 3 months and no steroid taper for 3 years.  Past Medical History  IPF on 4L [place] at home  Significant Hospital Events   12/23 PCCM called on consultation for failure to improve  Consults:  PCCM  Procedures:  None  Significant Diagnostic Tests:  CXR that I reviewed myself with significant fibrotic changes on both lungs  Micro Data:  Cultures None  Antimicrobials:  Rocephin 12/21>>> Zithromax 12/21>>>  Interim  history/subjective:  Had to increase O2 from 4 to 6L overnight  Objective   Blood pressure (!) 127/98, pulse 98, temperature 97.6 F (36.4 C), temperature source Oral, resp. rate 20, height 5\' 2"  (1.575 m), weight 63.2 kg, SpO2 91 %.        Intake/Output Summary (Last 24 hours) at 09/25/2018 0847 Last data filed at 09/25/2018 0600 Gross per 24 hour  Intake 580 ml  Output -  Net 580 ml   Filed Weights   09/22/18 2323  Weight: 63.2 kg   Examination: General: Mild respiratory distress, chronically ill appearing female HENT: [place]/AT, PERRL, EOM-I and MMM Lungs: Wheezing and bibasilar crackles noted Cardiovascular: RRR, Nl S1/S2 and -M/R/G Abdomen: Soft, NT, ND and +BS Extremities: -edema and -tenderness Neuro: Alert and interactive, moving all ext to command Skin: Intact  Resolved Hospital Problem list   N/A  Assessment & Plan:  82 year old female with IPF history who presents to PCCM with acute on chronic respiratory failure due to IPF and wheezing, no history or proof of obstructive lung disease.  Discussed with PCCM-NP.  Acute on chronic respiratory failure with hypoxemia: - Titrate O2 for sat of 88-92% - Will need a home concentrator that increases to 10L prior to discharge - Monitor for signs of aspiration - Anticipate will need a new baseline O2 level prior to  discharge - Two doses of lasix and re-evaluate via CXR in AM  Wheezing - Continue Solumedrol 40 mg IV q6 hours for now - PRN albuterol - Continue duonebs - Pulmicort - PFTs as outpatient  Allergic rhinitis: - Singular - Benadryl  Discussed with PCCM-NP.  PCCM will follow  Labs   CBC: Recent Labs  Lab 09/22/18 1956 09/22/18 2006  WBC 13.8*  --   NEUTROABS 10.8*  --   HGB 13.3 13.9  HCT 42.6 41.0  MCV 98.8  --   PLT 245  --     Basic Metabolic Panel: Recent Labs  Lab 09/22/18 1956 09/22/18 2006 09/24/18 0437 09/25/18 0605  NA 138 137 139 141  K 4.1 4.0 5.0 4.5  CL 103 103 103 105    CO2 24  --  26 26  GLUCOSE 133* 134* 176* 166*  BUN 23 22 30* 29*  CREATININE 0.74 0.70 0.85 0.63  CALCIUM 8.9  --  9.1 9.0  MG  --   --  2.5* 2.2   GFR: Estimated Creatinine Clearance: 49 mL/min (by C-G formula based on SCr of 0.63 mg/dL). Recent Labs  Lab 09/22/18 1956 09/23/18 0839  PROCALCITON  --  0.34  WBC 13.8*  --     Liver Function Tests: No results for input(s): AST, ALT, ALKPHOS, BILITOT, PROT, ALBUMIN in the last 168 hours. No results for input(s): LIPASE, AMYLASE in the last 168 hours. No results for input(s): AMMONIA in the last 168 hours.  ABG    Component Value Date/Time   TCO2 27 09/22/2018 2006     Coagulation Profile: No results for input(s): INR, PROTIME in the last 168 hours.  Cardiac Enzymes: No results for input(s): CKTOTAL, CKMB, CKMBINDEX, TROPONINI in the last 168 hours.  HbA1C: No results found for: HGBA1C  CBG: No results for input(s): GLUCAP in the last 168 hours.  Review of Systems:     Past Medical History  She,  has a past medical history of Arthritis, Drug-induced diffuse interstitial pulmonary fibrosis (HCC) (12/23/2013), UTI (urinary tract infection), Hypertension, and PONV (postoperative nausea and vomiting).   Surgical History    Past Surgical History:  Procedure Laterality Date  . ABDOMINAL HYSTERECTOMY    . cataracts    . POLYPECTOMY  30 YRS AGO   VOCAL CORDS  . ROTATOR CUFF REPAIR     RT  . SHOULDER SURGERY     LEFT  . TOTAL KNEE ARTHROPLASTY  05/01/2012   Procedure: TOTAL KNEE ARTHROPLASTY;  Surgeon: Loanne DrillingFrank V Aluisio, MD;  Location: [facility];  Service: Orthopedics;  Laterality: Right;     Social History   reports that she has never smoked. She has never used smokeless tobacco. She reports that she does not drink alcohol or use drugs.   Family History   Her family history is negative for Breast cancer.   Allergies Allergies  Allergen Reactions  . Doxycycline Other (See Comments)    Sore throat   .  Hydrocodone     Makes her dizzy  . Macrodantin [Nitrofurantoin Macrocrystal]     Lung Dz  . Simvastatin Rash     Home Medications  Prior to Admission medications   Medication Sig Start Date End Date Taking? Authorizing Provider  acetaminophen (TYLENOL) 325 MG tablet Take 650 mg by mouth every 12 (twelve) hours as needed for mild pain, moderate pain or headache.   Yes [provider]  albuterol (PROAIR HFA) 108 (90 Base) MCG/ACT inhaler Inhale 2 puffs  into the lungs every 4 (four) hours as needed for wheezing or shortness of breath. 12/11/15  Yes Parrett, Tammy S, NP  aspirin EC 81 MG tablet Take 81 mg by mouth daily.   Yes [provider]  Azelastine HCl 0.15 % SOLN Place 2 sprays into both nostrils 2 (two) times daily.  11/10/16  Yes [provider]  budesonide-formoterol (SYMBICORT) 80-4.5 MCG/ACT inhaler Inhale 2 puffs into the lungs 2 (two) times daily. 04/03/18  Yes Nyoka CowdenWert, Michael B, MD  chlorpheniramine (CHLOR-TRIMETON) 4 MG tablet Take 1-2 tablets by mouth every 4 hours as needed for drippy nose, drainage, and throat clearing   Yes [provider]  famotidine (PEPCID) 20 MG tablet Take 20 mg by mouth at bedtime.    Yes [provider]  furosemide (LASIX) 40 MG tablet Take 1 tablet (40 mg total) by mouth daily as needed for edema. 07/05/18  Yes Nyoka CowdenWert, Michael B, MD  hydrochlorothiazide (HYDRODIURIL) 25 MG tablet Take 25 mg by mouth daily as needed (swelling).    Yes [provider]  LORazepam (ATIVAN) 0.5 MG tablet Take 0.5 mg by mouth every 12 (twelve) hours as needed for anxiety.    Yes [provider]  montelukast (SINGULAIR) 10 MG tablet Take 10 mg by mouth at bedtime.   Yes [provider]  OXYGEN 2 L/min continuous.    Yes [provider]  pantoprazole (PROTONIX) 40 MG tablet Take 1 tablet (40 mg total) by mouth daily before breakfast. 08/31/18  Yes Nyoka CowdenWert, Michael B, MD  predniSONE (DELTASONE) 10 MG tablet Take  10 mg by mouth daily with breakfast.    Yes [provider]  Respiratory Therapy Supplies (FLUTTER) DEVI Use as directed 07/05/18  Yes Nyoka CowdenWert, Michael B, MD  sulfamethoxazole-trimethoprim (BACTRIM DS,SEPTRA DS) 800-160 MG tablet Take 1 tablet by mouth 2 (two) times daily.   Yes [provider]  traMADol (ULTRAM) 50 MG tablet Take 50 mg by mouth every 6 (six) hours as needed for moderate pain or severe pain.    Yes [provider]  TRAVATAN Z 0.004 % SOLN ophthalmic solution Apply 1 drop to eye at bedtime. 02/27/15  Yes [provider]    Alyson ReedyWesam G. Jemma Rasp, M.D. [facility]. Pager: [phone]. After hours pager: [phone].

## 2018-09-25 NOTE — Consult Note (Signed)
Consultation Note Date: 09/25/2018   Patient Name: Casey Freeman  DOB: 1936/05/11  MRN: [id number]  Age / Sex: 82 y.o., female  PCP: Shirline Frees, MD Referring Physician: Damita Lack, MD  Reason for Consultation: Establishing goals of care  HPI/Patient Profile: 82 y.o. female  admitted on 09/22/2018    Clinical Assessment and Goals of Care:   82 year old lady with known history of idiopathic pulmonary fibrosis, sees pulmonary specialist Dr. Melvyn Novas in the outpatient setting, admitted with acute on chronic respiratory failure due to idiopathic pulmonary fibrosis and wheezing.  Patient has been admitted with acute on chronic hypoxic respiratory failure. palliative consultation has been requested for goals of care discussions. I introduced myself and palliative care as follows:  Palliative medicine is specialized medical care for people living with serious illness. It focuses on providing relief from the symptoms and stress of a serious illness. The goal is to improve quality of life for both the patient and the family.   Patient remains on 6 L of oxygen currently. At home she was requiring up to 4 L of oxygen. She was eating okay. She was able to walk short distances with a walker.   Life history is significant for the fact that the patient had to move out of her home in which she had lived with her husband for over 21 years due to her respiratory status as well as due to her husband's declining condition with dementia. [facility], the patient's husband moved into their memory care unit now the patient lives in assisted living facility. She gets oxygen through [place]. She states that she has the durable medical equipment such as bedside commode also available at her home.  Patient states she has noted she is becoming dyspneic on exertion. Even the slightest exertion  causes her to become severely short of breath. He takes over 2 hours to take a shower. He has had to gradually increase her oxygen. She states," I know I'm getting worse."  Goals wishes and values discussed. Discussed about differences between hospice philosophy of care and palliative support. Offered presence, active listening and acknowledgment of the serious situation the patient finds herself in. Continue to medically optimize her respiratory status as best as possible. Pulmonary specialists are following, patient remains admitted on hospital medicine service.  Please note additional discussions/summary of recommendations as listed below.   NEXT OF KIN  husband has dementia, he is at memory care unit in [facility] She has a son and daughter in [employer] [place] (she's an Therapist, sports)  SUMMARY OF RECOMMENDATIONS    Agree with DNR Continue current mode of care Care manager consultation for arranging 10 L concentrator at her assisted living facility Would recommend out patient palliative care services through hospice and palliative care center of [place], [place] follow with the patient at her assisted living facility. Monitor her disease trajectory, she will need full scope of hospice services if she has escalating O2 requirements and ongoing functional decline and/or worsening dyspnea sensations.  Preliminarily, discussed with patient about how hospice can help, when the time is appropriate for her to be enrolled with hospice.   Code Status/Advance Care Planning:  DNR    Symptom Management:    as above   Palliative Prophylaxis:   Delirium Protocol   Psycho-social/Spiritual:   Desire for further Chaplaincy support:yes  Additional Recommendations: Caregiving  Support/Resources  Prognosis:   Unable to determine  Discharge Planning: ALF with out patient palliative care.       Primary Diagnoses: Present on Admission: . Dyspnea . Chest pain, musculoskeletal . Chronic respiratory failure with  hypoxia ([place]) . Cough . Drug-induced diffuse interstitial pulmonary fibrosis ([place]) . Essential hypertension . OA (osteoarthritis) of knee   I have reviewed the medical record, interviewed the patient and family, and examined the patient. The following aspects are pertinent.  Past Medical History:  Diagnosis Date  . Arthritis   . Drug-induced diffuse interstitial pulmonary fibrosis ([place]) 12/23/2013   See cxr 09/18/13 - rec avoid all macrodantin exp 12/23/2013  - recurrent macrodantin exposure last filled rx  06/2014 [facility] >cxr much worse 07/16/14 with ESR 32 > started short course prednisone  - med calendar 08/01/2014  -CT chest 08/06/14 >Pulmonary parenchymal pattern of fibrosis, as described above, appears progressive when compared with chest radiographs dating back to 07/27/2009. [place]  . Hx: UTI (urinary tract infection)   . Hypertension   . PONV (postoperative nausea and vomiting)    >20 YRS AGO - NO PROBLEM SINCE   Social History   Socioeconomic History  . Marital status: Married    Spouse name: Not on file  . Number of children: Not on file  . Years of education: Not on file  . Highest education level: Not on file  Occupational History  . Not on file  Social Needs  . Financial resource strain: Not on file  . Food insecurity:    Worry: Not on file    Inability: Not on file  . Transportation needs:    Medical: Not on file    Non-medical: Not on file  Tobacco Use  . Smoking status: Never Smoker  . Smokeless tobacco: Never Used  Substance and Sexual Activity  . Alcohol use: No  . Drug use: No  . Sexual activity: Not on file  Lifestyle  . Physical activity:    Days per week: Not on file    Minutes per session: Not on file  . Stress: Not on file  Relationships  . Social connections:    Talks on phone: Not on file    Gets together: Not on file    Attends religious service: Not on file    Active member of club or organization: Not on file    Attends meetings  of clubs or organizations: Not on file    Relationship status: Not on file  Other Topics Concern  . Not on file  Social History Narrative  . Not on file   Family History  Problem Relation Age of Onset  . Breast cancer Neg Hx    Scheduled Meds: . azelastine  2 spray Each Nare BID  . budesonide (PULMICORT) nebulizer solution  0.5 mg Nebulization BID  . diltiazem  60 mg Oral Q12H  . diphenhydrAMINE  25 mg Oral Q6H  . enoxaparin (LOVENOX) injection  60 mg Subcutaneous BID  . famotidine  20 mg Oral QHS  . furosemide  40 mg Intravenous Q8H  . ipratropium-albuterol  3 mL Nebulization QID  . latanoprost  1 drop Both Eyes QHS  . mouth rinse  15 mL Mouth Rinse BID  . methylPREDNISolone (SOLU-MEDROL) injection  40 mg Intravenous Q6H  . montelukast  10 mg Oral QHS  . pantoprazole  40 mg Oral Q0600  . ENSURE MAX PROTEIN  11 oz Oral Daily   Continuous Infusions: . cefTRIAXone (ROCEPHIN)  IV Stopped (09/24/18 1828)   PRN Meds:.acetaminophen, alum & mag hydroxide-simeth, benzonatate, LORazepam, traMADol Medications Prior to Admission:  Prior to Admission medications   Medication Sig Start Date End Date Taking? Authorizing Provider  acetaminophen (TYLENOL) 325 MG tablet Take 650 mg by mouth every 12 (twelve) hours as needed for mild pain, moderate pain or headache.   Yes [provider]  albuterol (PROAIR HFA) 108 (90 Base) MCG/ACT inhaler Inhale 2 puffs into the lungs every 4 (four) hours as needed for wheezing or shortness of breath. 12/11/15  Yes Parrett, Tammy S, NP  aspirin EC 81 MG tablet Take 81 mg by mouth daily.   Yes [provider]  Azelastine HCl 0.15 % SOLN Place 2 sprays into both nostrils 2 (two) times daily.  11/10/16  Yes [provider]  budesonide-formoterol (SYMBICORT) 80-4.5 MCG/ACT inhaler Inhale 2 puffs into the lungs 2 (two) times daily. 04/03/18  Yes Tanda Rockers, MD  chlorpheniramine (CHLOR-TRIMETON) 4 MG tablet Take 1-2 tablets by mouth every  4 hours as needed for drippy nose, drainage, and throat clearing   Yes [provider]  famotidine (PEPCID) 20 MG tablet Take 20 mg by mouth at bedtime.    Yes [provider]  furosemide (LASIX) 40 MG tablet Take 1 tablet (40 mg total) by mouth daily as needed for edema. 07/05/18  Yes Tanda Rockers, MD  hydrochlorothiazide (HYDRODIURIL) 25 MG tablet Take 25 mg by mouth daily as needed (swelling).    Yes [provider]  LORazepam (ATIVAN) 0.5 MG tablet Take 0.5 mg by mouth every 12 (twelve) hours as needed for anxiety.    Yes [provider]  montelukast (SINGULAIR) 10 MG tablet Take 10 mg by mouth at bedtime.   Yes [provider]  OXYGEN 2 L/min continuous.    Yes [provider]  pantoprazole (PROTONIX) 40 MG tablet Take 1 tablet (40 mg total) by mouth daily before breakfast. 08/31/18  Yes Tanda Rockers, MD  predniSONE (DELTASONE) 10 MG tablet Take 10 mg by mouth daily with breakfast.    Yes [provider]  Respiratory Therapy Supplies (FLUTTER) DEVI Use as directed 07/05/18  Yes Tanda Rockers, MD  sulfamethoxazole-trimethoprim (BACTRIM DS,SEPTRA DS) 800-160 MG tablet Take 1 tablet by mouth 2 (two) times daily.   Yes [provider]  traMADol (ULTRAM) 50 MG tablet Take 50 mg by mouth every 6 (six) hours as needed for moderate pain or severe pain.    Yes [provider]  TRAVATAN Z 0.004 % SOLN ophthalmic solution Apply 1 drop to eye at bedtime. 02/27/15  Yes [provider]   Allergies  Allergen Reactions  . Doxycycline Other (See Comments)    Sore throat   . Hydrocodone     Makes her dizzy  . Macrodantin [Nitrofurantoin Macrocrystal]     Lung Dz  . Simvastatin Rash   Review of Systems Becomes short of breath with exertion  Physical Exam General: Mild respiratory distress, chronically ill appearing female  Lungs: Wheezing and bibasilar crackles noted Cardiovascular: RRR, Nl S1/S2     Abdomen: Soft, NT, ND and +BS Extremities: -edema  and -tenderness Neuro: Alert and interactive,   Skin: Intact Vital Signs: BP (!) 129/94 (BP Location: Right Arm)   Pulse (!) 110   Temp 98.1 F (36.7 C) (Oral)   Resp 20   Ht 5' 2"  (1.575 m)   Wt 63.2 kg   SpO2 90%   BMI 25.48 kg/m  Pain Scale: 0-10   Pain Score: 0-No pain   SpO2: SpO2: 90 % O2 Device:SpO2: 90 % O2 Flow Rate: .O2 Flow Rate (L/min): 6 L/min  IO: Intake/output summary:   Intake/Output Summary (Last 24 hours) at 09/25/2018 1559 Last data filed at 09/25/2018 1224 Gross per 24 hour  Intake 540 ml  Output 200 ml  Net 340 ml    LBM: Last BM Date: 09/24/18 Baseline Weight: Weight: 63.2 kg Most recent weight: Weight: 63.2 kg     Palliative Assessment/Data:   PPS 30%  Time In:  1300 Time Out:  1400 Time Total: 60 min  Greater than 50%  of this time was spent counseling and coordinating care related to the above assessment and plan.  Signed by: Loistine Chance, MD  [phone] Please contact Palliative Medicine Team phone at [phone] for questions and concerns.  For individual provider: See Shea Evans

## 2018-09-25 NOTE — Progress Notes (Signed)
PROGRESS NOTE    Casey Freeman  ZOX:[id number] DOB: 1936/04/11 DOA: 09/22/2018 PCP: Johny Blamer, MD   Brief Narrative:  82 year old with history of interstitial pulmonary fibrosis, chronic hypoxia on 3-4 L nasal cannula, essential hypertension came from assisted living [facility] with complains of acute shortness of breath and chest heaviness.  She was diagnosed with likely acute flare of her pulmonary fibrosis and started on IV azithromycin and Rocephin.  Despite a steroid aggressive nebulizer use, she has not been improving much.  Pulmonary consulted.   Assessment & Plan:   Principal Problem:   Dyspnea Active Problems:   OA (osteoarthritis) of knee   Cough   Essential hypertension   Drug-induced diffuse interstitial pulmonary fibrosis (HCC)   Chronic respiratory failure with hypoxia (HCC)   Chest pain, musculoskeletal   Palliative care by specialist   Goals of care, counseling/discussion   Acute respiratory distress secondary to pulmonary fibrosis flare; more hypoxic than yesterday -Still requiring significant O2 supplement, 6L - On Rocephin and azithromycin -Procalcitonin 0.34; Plan to for Lasix for 2 doses. Repeat CXR tomorrow.  -Nebulizer treatments -Continue Pulmicort - Solu-Medrol 40 mg change to every 6 hours. -We will consult pulmonary for their assistance. - Appreciate Palliative input.   Paroxysmal atrial fibrillation, CHADsVASc 4 Atypical chest pain - Suspect secondary to underlying pulmonary pathology -Echocardiogram- ejection fraction 40-50%, mild aortic stenosis.  PA pressures 69. - Cardizem sustained release 60 mg twice daily - On Lovenox 60 mg twice daily, will eventually switch this to Eliquis.  Essential hypertension - On Cardizem 60 mg twice daily  Chronic back pain and anxiety -Pain control as needed  Glaucoma -Continue home meds  DVT prophylaxis: 60 mg twice daily with plans to transition to Eliquis Code Status: DNR Family  Communication: Daughter at bedside Disposition Plan: maintain hosp stay.   Consultants:   Pulmonary  Palliative  Procedures:   None  Antimicrobials:   Azithromycin  Rocephin   Subjective: Still quit a bit short of breath with minimal movement.   Review of Systems Otherwise negative except as per HPI, including: General = no fevers, chills, dizziness, malaise, fatigue HEENT/EYES = negative for pain, redness, loss of vision, double vision, blurred vision, loss of hearing, sore throat, hoarseness, dysphagia Cardiovascular= negative for chest pain, palpitation, murmurs, lower extremity swelling Respiratory/lungs= negative for shortness of breath, cough, hemoptysis, wheezing, mucus production Gastrointestinal= negative for nausea, vomiting,, abdominal pain, melena, hematemesis Genitourinary= negative for Dysuria, Hematuria, Change in Urinary Frequency MSK = Negative for arthralgia, myalgias, Back Pain, Joint swelling  Neurology= Negative for headache, seizures, numbness, tingling  Psychiatry= Negative for anxiety, depression, suicidal and homocidal ideation Allergy/Immunology= Medication/Food allergy as listed  Skin= Negative for Rash, lesions, ulcers, itching   Objective: Vitals:   09/25/18 0504 09/25/18 0847 09/25/18 1258 09/25/18 1636  BP: (!) 127/98  (!) 129/94   Pulse: 98  (!) 110   Resp: 20  20   Temp: 97.6 F (36.4 C)  98.1 F (36.7 C)   TempSrc: Oral  Oral   SpO2: 91% 91% 90% 92%  Weight:      Height:        Intake/Output Summary (Last 24 hours) at 09/25/2018 1702 Last data filed at 09/25/2018 1224 Gross per 24 hour  Intake 540 ml  Output 200 ml  Net 340 ml   Filed Weights   09/22/18 2323  Weight: 63.2 kg    Examination:  Constitutional: NAD, calm, comfortable Eyes: PERRL, lids and conjunctivae normal ENMT: Mucous membranes  are moist. Posterior pharynx clear of any exudate or lesions.Normal dentition.  Neck: normal, supple, no masses, no  thyromegaly Respiratory: significantly diminished BS with some exp wheezing.  Cardiovascular: Regular rate and rhythm, no murmurs / rubs / gallops. No extremity edema. 2+ pedal pulses. No carotid bruits.  Abdomen: no tenderness, no masses palpated. No hepatosplenomegaly. Bowel sounds positive.  Musculoskeletal: no clubbing / cyanosis. No joint deformity upper and lower extremities. Good ROM, no contractures. Normal muscle tone.  Skin: no rashes, lesions, ulcers. No induration Neurologic: CN 2-12 grossly intact. Sensation intact, DTR normal. Strength 5/5 in all 4.  Psychiatric: Normal judgment and insight. Alert and oriented x 3. Normal mood.      Data Reviewed:   CBC: Recent Labs  Lab 09/22/18 1956 09/22/18 2006  WBC 13.8*  --   NEUTROABS 10.8*  --   HGB 13.3 13.9  HCT 42.6 41.0  MCV 98.8  --   PLT 245  --    Basic Metabolic Panel: Recent Labs  Lab 09/22/18 1956 09/22/18 2006 09/24/18 0437 09/25/18 0605  NA 138 137 139 141  K 4.1 4.0 5.0 4.5  CL 103 103 103 105  CO2 24  --  26 26  GLUCOSE 133* 134* 176* 166*  BUN 23 22 30* 29*  CREATININE 0.74 0.70 0.85 0.63  CALCIUM 8.9  --  9.1 9.0  MG  --   --  2.5* 2.2   GFR: Estimated Creatinine Clearance: 49 mL/min (by C-G formula based on SCr of 0.63 mg/dL). Liver Function Tests: No results for input(s): AST, ALT, ALKPHOS, BILITOT, PROT, ALBUMIN in the last 168 hours. No results for input(s): LIPASE, AMYLASE in the last 168 hours. No results for input(s): AMMONIA in the last 168 hours. Coagulation Profile: No results for input(s): INR, PROTIME in the last 168 hours. Cardiac Enzymes: No results for input(s): CKTOTAL, CKMB, CKMBINDEX, TROPONINI in the last 168 hours. BNP (last 3 results) Recent Labs    04/03/18 0926  PROBNP 120.0*   HbA1C: No results for input(s): HGBA1C in the last 72 hours. CBG: No results for input(s): GLUCAP in the last 168 hours. Lipid Profile: No results for input(s): CHOL, HDL, LDLCALC,  TRIG, CHOLHDL, LDLDIRECT in the last 72 hours. Thyroid Function Tests: Recent Labs    09/23/18 0541 09/23/18 0839  TSH 0.141*  --   FREET4  --  1.22   Anemia Panel: No results for input(s): VITAMINB12, FOLATE, FERRITIN, TIBC, IRON, RETICCTPCT in the last 72 hours. Sepsis Labs: Recent Labs  Lab 09/23/18 0839  PROCALCITON 0.34    No results found for this or any previous visit (from the past 240 hour(s)).       Radiology Studies: Dg Chest Port 1 View  Result Date: 09/25/2018 CLINICAL DATA:  Increasing shortness of breath over the past 2 weeks in a patient with pulmonary fibrosis. EXAM: PORTABLE CHEST 1 VIEW COMPARISON:  PA and lateral chest 09/22/2018 and 07/05/2018. FINDINGS: Pulmonary fibrosis with superimposed bilateral airspace disease do not appear changed since the most recent exam. Heart size is upper normal. No pneumothorax or pleural effusion. IMPRESSION: No marked change in the appearance of pulmonary fibrosis and superimposed airspace disease which could be edema or pneumonia. Electronically Signed   By: Drusilla Kannerhomas  Dalessio M.D.   On: 09/25/2018 09:17        Scheduled Meds: . azelastine  2 spray Each Nare BID  . budesonide (PULMICORT) nebulizer solution  0.5 mg Nebulization BID  . diltiazem  60 mg  Oral Q12H  . diphenhydrAMINE  25 mg Oral Q6H  . enoxaparin (LOVENOX) injection  60 mg Subcutaneous BID  . famotidine  20 mg Oral QHS  . furosemide  40 mg Intravenous Q8H  . ipratropium-albuterol  3 mL Nebulization QID  . latanoprost  1 drop Both Eyes QHS  . mouth rinse  15 mL Mouth Rinse BID  . methylPREDNISolone (SOLU-MEDROL) injection  40 mg Intravenous Q6H  . montelukast  10 mg Oral QHS  . pantoprazole  40 mg Oral Q0600  . ENSURE MAX PROTEIN  11 oz Oral Daily   Continuous Infusions: . cefTRIAXone (ROCEPHIN)  IV Stopped (09/24/18 1828)     LOS: 3 days   Time spent= 45 mins    Jarion Hawthorne Joline Maxcy, MD [facility] Pager [phone]   If 7PM-7AM,  please contact night-coverage [email or web address] Password [email or web address] 09/25/2018, 5:02 PM   PROGRESS NOTE    Casey Freeman  ZHY:[id number] DOB: May 25, 1936 DOA: 09/22/2018 PCP: Johny Blamer, MD   Brief Narrative:  82 year old with history of interstitial pulmonary fibrosis, chronic hypoxia on 3-4 L nasal cannula, essential hypertension came from assisted living [facility] with complains of acute shortness of breath and chest heaviness.  She was diagnosed with likely acute flare of her pulmonary fibrosis and started on IV azithromycin and Rocephin.  Despite a steroid aggressive nebulizer use, she has not been improving much.  Pulmonary consulted.   Assessment & Plan:   Principal Problem:   Dyspnea Active Problems:   OA (osteoarthritis) of knee   Cough   Essential hypertension   Drug-induced diffuse interstitial pulmonary fibrosis (HCC)   Chronic respiratory failure with hypoxia (HCC)   Chest pain, musculoskeletal   Palliative care by specialist   Goals of care, counseling/discussion   Acute respiratory distress secondary to pulmonary fibrosis flare -Minimal improvement, still requiring 4-5 L.  Significantly gets short of breath even moving around in the bed. - On Rocephin and azithromycin -Procalcitonin 0.34 -Nebulizer treatments -Continue Pulmicort - Solu-Medrol 40 mg change to every 6 hours. -We will consult pulmonary for their assistance. - Consider palliative care consult as well.  Paroxysmal atrial fibrillation, CHADsVASc 4 Atypical chest pain - Suspect secondary to underlying pulmonary pathology -Echocardiogram- ejection fraction 40-50%, mild aortic stenosis.  PA pressures 69. - Cardizem sustained release 60 mg twice daily - On Lovenox 60 mg twice daily, will eventually switch this to Eliquis. -Check EKG.  Patient would not want any aggressive measures at this time including stress test or Cath  Essential hypertension - On Cardizem 60 mg twice daily  Chronic back pain  and anxiety -Pain control as needed  Glaucoma -Continue home meds  DVT prophylaxis: 60 mg twice daily with plans to transition to Eliquis Code Status: DNR Family Communication: Daughter at bedside Disposition Plan: Patient is still significantly short of breath will require at least another 3 to 4 days of hospital stay  Consultants:   Pulmonary  Procedures:   None  Antimicrobials:   Azithromycin  Rocephin   Subjective: Significant shortness of breath even with minimal ambulation.  Minimal improvement from yesterday.  She tells me she is not even able to go to the bedside commode without getting short of breath.  Review of Systems Otherwise negative except as per HPI, including: General = no fevers, chills, dizziness, malaise, fatigue HEENT/EYES = negative for pain, redness, loss of vision, double vision, blurred vision, loss of hearing, sore throat, hoarseness, dysphagia Cardiovascular= negative for chest pain, palpitation, murmurs, lower extremity swelling Respiratory/lungs=  negative forcough, hemoptysis, wheezing, mucus production Gastrointestinal= negative for nausea, vomiting,, abdominal pain, melena, hematemesis Genitourinary= negative for Dysuria, Hematuria, Change in Urinary Frequency MSK = Negative for arthralgia, myalgias, Back Pain, Joint swelling  Neurology= Negative for headache, seizures, numbness, tingling  Psychiatry= Negative for anxiety, depression, suicidal and homocidal ideation Allergy/Immunology= Medication/Food allergy as listed  Skin= Negative for Rash, lesions, ulcers, itching   Objective: Vitals:   09/25/18 0504 09/25/18 0847 09/25/18 1258 09/25/18 1636  BP: (!) 127/98  (!) 129/94   Pulse: 98  (!) 110   Resp: 20  20   Temp: 97.6 F (36.4 C)  98.1 F (36.7 C)   TempSrc: Oral  Oral   SpO2: 91% 91% 90% 92%  Weight:      Height:        Intake/Output Summary (Last 24 hours) at 09/25/2018 1703 Last data filed at 09/25/2018 1224 Gross per  24 hour  Intake 540 ml  Output 200 ml  Net 340 ml   Filed Weights   09/22/18 2323  Weight: 63.2 kg    Examination:  Constitutional: NAD, calm, comfortable, on 3-4 L nasal cannula. Eyes: PERRL, lids and conjunctivae normal ENMT: Mucous membranes are moist. Posterior pharynx clear of any exudate or lesions.Normal dentition.  Neck: normal, supple, no masses, no thyromegaly Respiratory: Diffuse expiratory wheezing.  Unable to speak with me in full sentences without getting short of breath. Cardiovascular: Regular rate and rhythm, no murmurs / rubs / gallops. No extremity edema. 2+ pedal pulses. No carotid bruits.  Abdomen: no tenderness, no masses palpated. No hepatosplenomegaly. Bowel sounds positive.  Musculoskeletal: no clubbing / cyanosis. No joint deformity upper and lower extremities. Good ROM, no contractures. Normal muscle tone.  Skin: no rashes, lesions, ulcers. No induration Neurologic: CN 2-12 grossly intact. Sensation intact, DTR normal. Strength 5/5 in all 4.  Psychiatric: Normal judgment and insight. Alert and oriented x 3. Normal mood.     Data Reviewed:   CBC: Recent Labs  Lab 09/22/18 1956 09/22/18 2006  WBC 13.8*  --   NEUTROABS 10.8*  --   HGB 13.3 13.9  HCT 42.6 41.0  MCV 98.8  --   PLT 245  --    Basic Metabolic Panel: Recent Labs  Lab 09/22/18 1956 09/22/18 2006 09/24/18 0437 09/25/18 0605  NA 138 137 139 141  K 4.1 4.0 5.0 4.5  CL 103 103 103 105  CO2 24  --  26 26  GLUCOSE 133* 134* 176* 166*  BUN 23 22 30* 29*  CREATININE 0.74 0.70 0.85 0.63  CALCIUM 8.9  --  9.1 9.0  MG  --   --  2.5* 2.2   GFR: Estimated Creatinine Clearance: 49 mL/min (by C-G formula based on SCr of 0.63 mg/dL). Liver Function Tests: No results for input(s): AST, ALT, ALKPHOS, BILITOT, PROT, ALBUMIN in the last 168 hours. No results for input(s): LIPASE, AMYLASE in the last 168 hours. No results for input(s): AMMONIA in the last 168 hours. Coagulation Profile: No  results for input(s): INR, PROTIME in the last 168 hours. Cardiac Enzymes: No results for input(s): CKTOTAL, CKMB, CKMBINDEX, TROPONINI in the last 168 hours. BNP (last 3 results) Recent Labs    04/03/18 0926  PROBNP 120.0*   HbA1C: No results for input(s): HGBA1C in the last 72 hours. CBG: No results for input(s): GLUCAP in the last 168 hours. Lipid Profile: No results for input(s): CHOL, HDL, LDLCALC, TRIG, CHOLHDL, LDLDIRECT in the last 72 hours. Thyroid Function Tests:  Recent Labs    09/23/18 0541 09/23/18 0839  TSH 0.141*  --   FREET4  --  1.22   Anemia Panel: No results for input(s): VITAMINB12, FOLATE, FERRITIN, TIBC, IRON, RETICCTPCT in the last 72 hours. Sepsis Labs: Recent Labs  Lab 09/23/18 0839  PROCALCITON 0.34    No results found for this or any previous visit (from the past 240 hour(s)).       Radiology Studies: Dg Chest Port 1 View  Result Date: 09/25/2018 CLINICAL DATA:  Increasing shortness of breath over the past 2 weeks in a patient with pulmonary fibrosis. EXAM: PORTABLE CHEST 1 VIEW COMPARISON:  PA and lateral chest 09/22/2018 and 07/05/2018. FINDINGS: Pulmonary fibrosis with superimposed bilateral airspace disease do not appear changed since the most recent exam. Heart size is upper normal. No pneumothorax or pleural effusion. IMPRESSION: No marked change in the appearance of pulmonary fibrosis and superimposed airspace disease which could be edema or pneumonia. Electronically Signed   By: Drusilla Kanner M.D.   On: 09/25/2018 09:17        Scheduled Meds: . azelastine  2 spray Each Nare BID  . budesonide (PULMICORT) nebulizer solution  0.5 mg Nebulization BID  . diltiazem  60 mg Oral Q12H  . diphenhydrAMINE  25 mg Oral Q6H  . enoxaparin (LOVENOX) injection  60 mg Subcutaneous BID  . famotidine  20 mg Oral QHS  . furosemide  40 mg Intravenous Q8H  . ipratropium-albuterol  3 mL Nebulization QID  . latanoprost  1 drop Both Eyes QHS  .  mouth rinse  15 mL Mouth Rinse BID  . methylPREDNISolone (SOLU-MEDROL) injection  40 mg Intravenous Q6H  . montelukast  10 mg Oral QHS  . pantoprazole  40 mg Oral Q0600  . ENSURE MAX PROTEIN  11 oz Oral Daily   Continuous Infusions: . cefTRIAXone (ROCEPHIN)  IV Stopped (09/24/18 1828)     LOS: 3 days   Time spent= 35 mins    Norissa Bartee Joline Maxcy, MD [facility] Pager [phone]   If 7PM-7AM, please contact night-coverage [email or web address] Password [facility] 09/25/2018, 5:03 PM

## 2018-09-25 NOTE — Care Management Important Message (Signed)
Important Message  Patient Details  Name: Casey Freeman MRN: [id number] Date of Birth: June 02, 1936   Medicare Important Message Given:  Yes    Caren MacadamFuller, Londyn Hotard 09/25/2018, 9:51 AMImportant Message  Patient Details  Name: Casey Freeman MRN: [id number] Date of Birth: June 02, 1936   Medicare Important Message Given:  Yes    Caren MacadamFuller, Socorro Kanitz 09/25/2018, 9:51 AM

## 2018-09-25 NOTE — Progress Notes (Signed)
PT demonstrated hands on understanding of Flutter device- NPC at this time. 

## 2018-09-26 ENCOUNTER — Inpatient Hospital Stay (HOSPITAL_COMMUNITY): Payer: Medicare Other

## 2018-09-26 LAB — CBC
HCT: 41.9 % (ref 36.0–46.0)
Hemoglobin: 13.2 g/dL (ref 12.0–15.0)
MCH: 31.4 pg (ref 26.0–34.0)
MCHC: 31.5 g/dL (ref 30.0–36.0)
MCV: 99.5 fL (ref 80.0–100.0)
Platelets: 300 10*3/uL (ref 150–400)
RBC: 4.21 MIL/uL (ref 3.87–5.11)
RDW: 12.9 % (ref 11.5–15.5)
WBC: 10.7 10*3/uL — ABNORMAL HIGH (ref 4.0–10.5)
nRBC: 0 % (ref 0.0–0.2)

## 2018-09-26 LAB — URINE CULTURE: Culture: NO GROWTH

## 2018-09-26 LAB — BASIC METABOLIC PANEL
Anion gap: 10 (ref 5–15)
BUN: 30 mg/dL — ABNORMAL HIGH (ref 8–23)
CO2: 30 mmol/L (ref 22–32)
Calcium: 8.8 mg/dL — ABNORMAL LOW (ref 8.9–10.3)
Chloride: 102 mmol/L (ref 98–111)
Creatinine, Ser: 0.67 mg/dL (ref 0.44–1.00)
GFR calc Af Amer: >60 mL/min (ref >60)
GFR calc non Af Amer: >60 mL/min (ref >60)
GLUCOSE: 171 mg/dL — AB (ref 70–99)
Potassium: 4.2 mmol/L (ref 3.5–5.1)
Sodium: 142 mmol/L (ref 135–145)

## 2018-09-26 LAB — MAGNESIUM: Magnesium: 2 mg/dL (ref 1.7–2.4)

## 2018-09-26 LAB — PHOSPHORUS: Phosphorus: 3.6 mg/dL (ref 2.5–4.6)

## 2018-09-26 MED ORDER — ACETAMINOPHEN 325 MG PO TABS
650.0000 mg | ORAL_TABLET | Freq: Four times a day (QID) | ORAL | Status: DC | PRN
  Administered 2018-09-29: 650 mg via ORAL
  Filled 2018-09-26: qty 2

## 2018-09-26 MED ORDER — DILTIAZEM HCL 25 MG/5ML IV SOLN
10.0000 mg | Freq: Once | INTRAVENOUS | Status: AC
  Administered 2018-09-26: 10 mg via INTRAVENOUS
  Filled 2018-09-26: qty 5

## 2018-09-26 MED ORDER — LORAZEPAM 0.5 MG PO TABS
0.5000 mg | ORAL_TABLET | Freq: Three times a day (TID) | ORAL | Status: DC | PRN
  Administered 2018-09-26 – 2018-10-01 (×9): 0.5 mg via ORAL
  Filled 2018-09-26 (×9): qty 1

## 2018-09-26 MED ORDER — DILTIAZEM HCL 90 MG PO TABS
90.0000 mg | ORAL_TABLET | Freq: Three times a day (TID) | ORAL | Status: DC
  Administered 2018-09-26 – 2018-10-01 (×14): 90 mg via ORAL
  Filled 2018-09-26 (×14): qty 1

## 2018-09-26 MED ORDER — ALBUTEROL SULFATE (2.5 MG/3ML) 0.083% IN NEBU
2.5000 mg | INHALATION_SOLUTION | RESPIRATORY_TRACT | Status: DC | PRN
  Administered 2018-09-26: 2.5 mg via RESPIRATORY_TRACT
  Filled 2018-09-26: qty 3

## 2018-09-26 NOTE — Progress Notes (Signed)
RT called by bedside RN; patient complaining of SOB. Upon arrival, patient had increased WOB and fine crackles noted bilaterally; O2 sats on 6 L - 82%. Patient administered scheduled breathing txs, rapid response RN called by RT, and MD notified by bedside RN. Patient improved post-txs, but remained hypoxic and O2 titrated to 11L HFNC; O2 sats 97%. RT will continue to monitor patient.

## 2018-09-26 NOTE — Significant Event (Signed)
Rapid Response Event Note  Overview: Time Called: 0806 Arrival Time: 0810 Event Type: Respiratory  Notified by Respiratory Therapist, Megan, that patient was desaturating at 82% on [name] 6L. Upon arrival, Aundra MilletMegan RT was providing a breathing treatment to patient. Patient had some work of breathing at the time. Patient complained of having SOB. MD Nelson ChimesAmin paged to bedside by [facility] RT.   Initial Focused Assessment: Neuro: Alert and oriented x 4, able to follow simple commands, neuro status WNL. Cardiac: Afib, 110s-130s, irregular pulses in all radial and pedal locations, 1+ Pulmonary: O2 Sats 82%-improved with breathing treatment see eMAR, after treatment, Megan RT placed patient on HFNC at 11Liters, patients O2 Sats increased to 97%, WOB decreased, respirations 15-20   Interventions: MD-place patient on HFNC Continue to monitor O2 Sats, notify MD if contain maintain O2 Saturations 88% or above.  Plan of Care (if not transferred): If patient increases WOB or cannot maintain O2 Saturations, will transfer patient to ICU/SD for BIPAP.  Event Summary:   at      at          [facility],Casey Freeman

## 2018-09-26 NOTE — Progress Notes (Addendum)
PROGRESS NOTE    KELEIGH KAZEE  JXB:[id number] DOB: 01-10-1936 DOA: 09/22/2018 PCP: Johny Blamer, MD   Brief Narrative:  82 year old with history of interstitial pulmonary fibrosis, chronic hypoxia on 3-4 L nasal cannula, essential hypertension came from assisted living [facility] with complains of acute shortness of breath and chest heaviness.  She was diagnosed with likely acute flare of her pulmonary fibrosis and started on IV azithromycin and Rocephin.  Despite a steroid aggressive nebulizer use, she has not been improving much.  Pulmonary consulted.   Assessment & Plan:   Principal Problem:   Dyspnea Active Problems:   OA (osteoarthritis) of knee   Cough   Essential hypertension   Drug-induced diffuse interstitial pulmonary fibrosis (HCC)   Chronic respiratory failure with hypoxia (HCC)   Chest pain, musculoskeletal   Palliative care by specialist   Goals of care, counseling/discussion   Acute respiratory distress secondary to pulmonary fibrosis flare; worsened -Worsened this morning.  Oxygen had to be increased to 10 L nasal cannula.  After a while it improved to 95%. - On Rocephin and azithromycin -Procalcitonin 0.34;  -Chest x-ray- advanced chronic interstitial lung disease with patchy airspace -Status post Lasix, if needed we will give her more lasix.  -Nebulizer treatments -Continue Pulmicort - Solu-Medrol 40 mg change to every 6 hours. -Appreciate Pulm Input.  - Appreciate Palliative input.   If continue to have O2 requirement >6L [place], she will be transferred to [facility] for closer monitor and possible NIPPV needs.   Paroxysmal atrial fibrillation, CHADsVASc 4 Atypical chest pain - Suspect secondary to underlying pulmonary pathology -Echocardiogram- ejection fraction 40-50%, mild aortic stenosis.  PA pressures 69. - Cardizem sustained release 60 mg twice daily - On Lovenox 60 mg twice daily, will eventually switch this to Eliquis.   UPDATE AT  610PM: Due to persistent tachycardia, will increase the cardizem to 90mg  q8hrs.   Essential hypertension - On Cardizem 60 mg twice daily  Chronic back pain and anxiety -Pain control as needed  Glaucoma -Continue home meds  DVT prophylaxis: Lovenox SQ, therapeutic dose.  Code Status: DNR Family Communication: None at bedside  Disposition Plan: maintain hosp stay. Low threshold to tx to Stepdown unit for NIPPV.  Consultants:   Pulmonary  Palliative  Procedures:   None  Antimicrobials:   Azithromycin  Rocephin   Subjective: Patient became quite short of breath this morning requiring her oxygen to be bumped up to 11 L.  She was desaturating into the mid 70s.  She is unable to take deep breaths and felt extremely short of breath at rest. I evaluated the patient at bedside during this time and advised her to relax and take deep breaths.  She had just received breathing treatment and steroids.  Over next few minutes her oxygen saturation improved to greater than 90% and oxygen was weaned down to a liter.  She was finally able to speak in short sentences without getting short of breath.  Review of Systems Otherwise negative except as per HPI, including: General = no fevers, chills, dizziness, malaise, fatigue HEENT/EYES = negative for pain, redness, loss of vision, double vision, blurred vision, loss of hearing, sore throat, hoarseness, dysphagia Cardiovascular= negative for chest pain, palpitation, murmurs, lower extremity swelling Respiratory/lungs= negative for shortness of breath, cough, hemoptysis, wheezing, mucus production Gastrointestinal= negative for nausea, vomiting,, abdominal pain, melena, hematemesis Genitourinary= negative for Dysuria, Hematuria, Change in Urinary Frequency MSK = Negative for arthralgia, myalgias, Back Pain, Joint swelling  Neurology= Negative for headache,  seizures, numbness, tingling  Psychiatry= Negative for anxiety, depression, suicidal and  homocidal ideation Allergy/Immunology= Medication/Food allergy as listed  Skin= Negative for Rash, lesions, ulcers, itching   Objective: Vitals:   09/26/18 0922 09/26/18 0940 09/26/18 1004 09/26/18 1100  BP:      Pulse:    (!) 123  Resp:    (!) 22  Temp:      TempSrc:      SpO2: 100% 97% 96% 96%  Weight:      Height:        Intake/Output Summary (Last 24 hours) at 09/26/2018 1114 Last data filed at 09/26/2018 0900 Gross per 24 hour  Intake 420 ml  Output 1850 ml  Net -1430 ml   Filed Weights   09/22/18 2323  Weight: 63.2 kg    Examination:  Constitutional: At first she was extremely under distress due to shortness of breath but it improved within few minutes, on 8 liter nasal cannula Eyes: PERRL, lids and conjunctivae normal ENMT: Mucous membranes are moist. Posterior pharynx clear of any exudate or lesions.Normal dentition.  Neck: normal, supple, no masses, no thyromegaly Respiratory: Diffuse diminished breath sounds Cardiovascular: Regular rate and rhythm, no murmurs / rubs / gallops. No extremity edema. 2+ pedal pulses. No carotid bruits.  Abdomen: no tenderness, no masses palpated. No hepatosplenomegaly. Bowel sounds positive.  Musculoskeletal: no clubbing / cyanosis. No joint deformity upper and lower extremities. Good ROM, no contractures. Normal muscle tone.  Skin: no rashes, lesions, ulcers. No induration Neurologic: CN 2-12 grossly intact. Sensation intact, DTR normal. Strength 4/5 in all 4.  Psychiatric: Normal judgment and insight. Alert and oriented x 3. Normal mood.      Data Reviewed:   CBC: Recent Labs  Lab 09/22/18 1956 09/22/18 2006 09/26/18 0549  WBC 13.8*  --  10.7*  NEUTROABS 10.8*  --   --   HGB 13.3 13.9 13.2  HCT 42.6 41.0 41.9  MCV 98.8  --  99.5  PLT 245  --  300   Basic Metabolic Panel: Recent Labs  Lab 09/22/18 1956 09/22/18 2006 09/24/18 0437 09/25/18 0605 09/26/18 0549  NA 138 137 139 141 142  K 4.1 4.0 5.0 4.5 4.2    CL 103 103 103 105 102  CO2 24  --  26 26 30   GLUCOSE 133* 134* 176* 166* 171*  BUN 23 22 30* 29* 30*  CREATININE 0.74 0.70 0.85 0.63 0.67  CALCIUM 8.9  --  9.1 9.0 8.8*  MG  --   --  2.5* 2.2 2.0  PHOS  --   --   --   --  3.6   GFR: Estimated Creatinine Clearance: 49 mL/min (by C-G formula based on SCr of 0.67 mg/dL). Liver Function Tests: No results for input(s): AST, ALT, ALKPHOS, BILITOT, PROT, ALBUMIN in the last 168 hours. No results for input(s): LIPASE, AMYLASE in the last 168 hours. No results for input(s): AMMONIA in the last 168 hours. Coagulation Profile: No results for input(s): INR, PROTIME in the last 168 hours. Cardiac Enzymes: No results for input(s): CKTOTAL, CKMB, CKMBINDEX, TROPONINI in the last 168 hours. BNP (last 3 results) Recent Labs    04/03/18 0926  PROBNP 120.0*   HbA1C: No results for input(s): HGBA1C in the last 72 hours. CBG: No results for input(s): GLUCAP in the last 168 hours. Lipid Profile: No results for input(s): CHOL, HDL, LDLCALC, TRIG, CHOLHDL, LDLDIRECT in the last 72 hours. Thyroid Function Tests: No results for input(s): TSH, T4TOTAL,  FREET4, T3FREE, THYROIDAB in the last 72 hours. Anemia Panel: No results for input(s): VITAMINB12, FOLATE, FERRITIN, TIBC, IRON, RETICCTPCT in the last 72 hours. Sepsis Labs: Recent Labs  Lab 09/23/18 0839  PROCALCITON 0.34    Recent Results (from the past 240 hour(s))  Culture, Urine     Status: None   Collection Time: 09/25/18 12:23 PM  Result Value Ref Range Status   Specimen Description   Final    URINE, CLEAN CATCH Performed at [facility], [address], [place], [place] [address]    Special Requests   Final    NONE Performed at [facility], [address], [place], [place] [address]    Culture   Final    NO GROWTH Performed at [facility], [address] [place], [place] [address]    Report Status 09/26/2018 FINAL  Final          Radiology Studies: Dg Chest Port 1 View  Result Date: 09/26/2018 CLINICAL DATA:  Respiratory failure EXAM: PORTABLE CHEST 1 VIEW COMPARISON:  09/25/2018 FINDINGS: 0556 hours. Cardiopericardial silhouette is at upper limits of normal for size. Interstitial markings are diffusely coarsened with chronic features. No evidence for pneumothorax or pleural effusion. The visualized bony structures of the thorax are intact. Telemetry leads overlie the chest. IMPRESSION: Similar appearance of advanced underlying chronic interstitial lung disease with patchy areas of airspace opacity bilaterally. Electronically Signed   By: Kennith Center M.D.   On: 09/26/2018 08:11   Dg Chest Port 1 View  Result Date: 09/25/2018 CLINICAL DATA:  Increasing shortness of breath over the past 2 weeks in a patient with pulmonary fibrosis. EXAM: PORTABLE CHEST 1 VIEW COMPARISON:  PA and lateral chest 09/22/2018 and 07/05/2018. FINDINGS: Pulmonary fibrosis with superimposed bilateral airspace disease do not appear changed since the most recent exam. Heart size is upper normal. No pneumothorax or pleural effusion. IMPRESSION: No marked change in the appearance of pulmonary fibrosis and superimposed airspace disease which could be edema or pneumonia. Electronically Signed   By: Drusilla Kanner M.D.   On: 09/25/2018 09:17        Scheduled Meds: . azelastine  2 spray Each Nare BID  . budesonide (PULMICORT) nebulizer solution  0.5 mg Nebulization BID  . diltiazem  60 mg Oral Q12H  . diphenhydrAMINE  25 mg Oral Q6H  . enoxaparin (LOVENOX) injection  60 mg Subcutaneous BID  . famotidine  20 mg Oral QHS  . ipratropium-albuterol  3 mL Nebulization QID  . latanoprost  1 drop Both Eyes QHS  . mouth rinse  15 mL Mouth Rinse BID  . methylPREDNISolone (SOLU-MEDROL) injection  40 mg Intravenous Q6H  . montelukast  10 mg Oral QHS  . pantoprazole  40 mg Oral Q0600  . ENSURE MAX PROTEIN  11 oz Oral Daily   Continuous  Infusions: . cefTRIAXone (ROCEPHIN)  IV 1 g (09/25/18 1712)     LOS: 4 days   Time spent= 45 mins    Ankit Joline Maxcy, MD [facility] Pager [phone]   If 7PM-7AM, please contact night-coverage [email or web address] Password [email or web address] 09/26/2018, 11:14 AM   PROGRESS NOTE    HELOISE GORDAN  ZHY:[id number] DOB: Apr 05, 1936 DOA: 09/22/2018 PCP: Johny Blamer, MD   Brief Narrative:  82 year old with history of interstitial pulmonary fibrosis, chronic hypoxia on 3-4 L nasal cannula, essential hypertension came from assisted living [facility] with complains of acute shortness of breath and chest heaviness.  She was diagnosed  with likely acute flare of her pulmonary fibrosis and started on IV azithromycin and Rocephin.  Despite a steroid aggressive nebulizer use, she has not been improving much.  Pulmonary consulted.   Assessment & Plan:   Principal Problem:   Dyspnea Active Problems:   OA (osteoarthritis) of knee   Cough   Essential hypertension   Drug-induced diffuse interstitial pulmonary fibrosis (HCC)   Chronic respiratory failure with hypoxia (HCC)   Chest pain, musculoskeletal   Palliative care by specialist   Goals of care, counseling/discussion   Acute respiratory distress secondary to pulmonary fibrosis flare -Minimal improvement, still requiring 4-5 L.  Significantly gets short of breath even moving around in the bed. - On Rocephin and azithromycin -Procalcitonin 0.34 -Nebulizer treatments -Continue Pulmicort - Solu-Medrol 40 mg change to every 6 hours. -We will consult pulmonary for their assistance. - Consider palliative care consult as well.  Paroxysmal atrial fibrillation, CHADsVASc 4 Atypical chest pain - Suspect secondary to underlying pulmonary pathology -Echocardiogram- ejection fraction 40-50%, mild aortic stenosis.  PA pressures 69. - Cardizem sustained release 60 mg twice daily - On Lovenox 60 mg twice daily, will eventually switch this to  Eliquis. -Check EKG.  Patient would not want any aggressive measures at this time including stress test or Cath  Essential hypertension - On Cardizem 60 mg twice daily  Chronic back pain and anxiety -Pain control as needed  Glaucoma -Continue home meds  DVT prophylaxis: 60 mg twice daily with plans to transition to Eliquis Code Status: DNR Family Communication: Daughter at bedside Disposition Plan: Patient is still significantly short of breath will require at least another 3 to 4 days of hospital stay  Consultants:   Pulmonary  Procedures:   None  Antimicrobials:   Azithromycin  Rocephin   Subjective: Significant shortness of breath even with minimal ambulation.  Minimal improvement from yesterday.  She tells me she is not even able to go to the bedside commode without getting short of breath.  Review of Systems Otherwise negative except as per HPI, including: General = no fevers, chills, dizziness, malaise, fatigue HEENT/EYES = negative for pain, redness, loss of vision, double vision, blurred vision, loss of hearing, sore throat, hoarseness, dysphagia Cardiovascular= negative for chest pain, palpitation, murmurs, lower extremity swelling Respiratory/lungs= negative forcough, hemoptysis, wheezing, mucus production Gastrointestinal= negative for nausea, vomiting,, abdominal pain, melena, hematemesis Genitourinary= negative for Dysuria, Hematuria, Change in Urinary Frequency MSK = Negative for arthralgia, myalgias, Back Pain, Joint swelling  Neurology= Negative for headache, seizures, numbness, tingling  Psychiatry= Negative for anxiety, depression, suicidal and homocidal ideation Allergy/Immunology= Medication/Food allergy as listed  Skin= Negative for Rash, lesions, ulcers, itching   Objective: Vitals:   09/26/18 0922 09/26/18 0940 09/26/18 1004 09/26/18 1100  BP:      Pulse:    (!) 123  Resp:    (!) 22  Temp:      TempSrc:      SpO2: 100% 97% 96% 96%   Weight:      Height:        Intake/Output Summary (Last 24 hours) at 09/26/2018 1114 Last data filed at 09/26/2018 0900 Gross per 24 hour  Intake 420 ml  Output 1850 ml  Net -1430 ml   Filed Weights   09/22/18 2323  Weight: 63.2 kg    Examination:  Constitutional: NAD, calm, comfortable, on 3-4 L nasal cannula. Eyes: PERRL, lids and conjunctivae normal ENMT: Mucous membranes are moist. Posterior pharynx clear of any exudate or lesions.Normal dentition.  Neck: normal, supple, no masses, no thyromegaly Respiratory: Diffuse expiratory wheezing.  Unable to speak with me in full sentences without getting short of breath. Cardiovascular: Regular rate and rhythm, no murmurs / rubs / gallops. No extremity edema. 2+ pedal pulses. No carotid bruits.  Abdomen: no tenderness, no masses palpated. No hepatosplenomegaly. Bowel sounds positive.  Musculoskeletal: no clubbing / cyanosis. No joint deformity upper and lower extremities. Good ROM, no contractures. Normal muscle tone.  Skin: no rashes, lesions, ulcers. No induration Neurologic: CN 2-12 grossly intact. Sensation intact, DTR normal. Strength 5/5 in all 4.  Psychiatric: Normal judgment and insight. Alert and oriented x 3. Normal mood.     Data Reviewed:   CBC: Recent Labs  Lab 09/22/18 1956 09/22/18 2006 09/26/18 0549  WBC 13.8*  --  10.7*  NEUTROABS 10.8*  --   --   HGB 13.3 13.9 13.2  HCT 42.6 41.0 41.9  MCV 98.8  --  99.5  PLT 245  --  300   Basic Metabolic Panel: Recent Labs  Lab 09/22/18 1956 09/22/18 2006 09/24/18 0437 09/25/18 0605 09/26/18 0549  NA 138 137 139 141 142  K 4.1 4.0 5.0 4.5 4.2  CL 103 103 103 105 102  CO2 24  --  26 26 30   GLUCOSE 133* 134* 176* 166* 171*  BUN 23 22 30* 29* 30*  CREATININE 0.74 0.70 0.85 0.63 0.67  CALCIUM 8.9  --  9.1 9.0 8.8*  MG  --   --  2.5* 2.2 2.0  PHOS  --   --   --   --  3.6   GFR: Estimated Creatinine Clearance: 49 mL/min (by C-G formula based on SCr of 0.67  mg/dL). Liver Function Tests: No results for input(s): AST, ALT, ALKPHOS, BILITOT, PROT, ALBUMIN in the last 168 hours. No results for input(s): LIPASE, AMYLASE in the last 168 hours. No results for input(s): AMMONIA in the last 168 hours. Coagulation Profile: No results for input(s): INR, PROTIME in the last 168 hours. Cardiac Enzymes: No results for input(s): CKTOTAL, CKMB, CKMBINDEX, TROPONINI in the last 168 hours. BNP (last 3 results) Recent Labs    04/03/18 0926  PROBNP 120.0*   HbA1C: No results for input(s): HGBA1C in the last 72 hours. CBG: No results for input(s): GLUCAP in the last 168 hours. Lipid Profile: No results for input(s): CHOL, HDL, LDLCALC, TRIG, CHOLHDL, LDLDIRECT in the last 72 hours. Thyroid Function Tests: No results for input(s): TSH, T4TOTAL, FREET4, T3FREE, THYROIDAB in the last 72 hours. Anemia Panel: No results for input(s): VITAMINB12, FOLATE, FERRITIN, TIBC, IRON, RETICCTPCT in the last 72 hours. Sepsis Labs: Recent Labs  Lab 09/23/18 0839  PROCALCITON 0.34    Recent Results (from the past 240 hour(s))  Culture, Urine     Status: None   Collection Time: 09/25/18 12:23 PM  Result Value Ref Range Status   Specimen Description   Final    URINE, CLEAN CATCH Performed at [facility], [address], [place], [place] [address]    Special Requests   Final    NONE Performed at [facility], [address] [place], [place] [address]    Culture   Final    NO GROWTH Performed at [facility], [address] [place], [place] [address]    Report Status 09/26/2018 FINAL  Final         Radiology Studies: Dg Chest Port 1 View  Result Date: 09/26/2018 CLINICAL DATA:  Respiratory failure EXAM: PORTABLE CHEST 1 VIEW COMPARISON:  09/25/2018 FINDINGS: 0556 hours. Cardiopericardial silhouette is at upper limits of normal for size. Interstitial markings are diffusely coarsened with chronic features. No  evidence for pneumothorax or pleural effusion. The visualized bony structures of the thorax are intact. Telemetry leads overlie the chest. IMPRESSION: Similar appearance of advanced underlying chronic interstitial lung disease with patchy areas of airspace opacity bilaterally. Electronically Signed   By: Kennith CenterEric  Mansell M.D.   On: 09/26/2018 08:11   Dg Chest Port 1 View  Result Date: 09/25/2018 CLINICAL DATA:  Increasing shortness of breath over the past 2 weeks in a patient with pulmonary fibrosis. EXAM: PORTABLE CHEST 1 VIEW COMPARISON:  PA and lateral chest 09/22/2018 and 07/05/2018. FINDINGS: Pulmonary fibrosis with superimposed bilateral airspace disease do not appear changed since the most recent exam. Heart size is upper normal. No pneumothorax or pleural effusion. IMPRESSION: No marked change in the appearance of pulmonary fibrosis and superimposed airspace disease which could be edema or pneumonia. Electronically Signed   By: Drusilla Kannerhomas  Dalessio M.D.   On: 09/25/2018 09:17        Scheduled Meds: . azelastine  2 spray Each Nare BID  . budesonide (PULMICORT) nebulizer solution  0.5 mg Nebulization BID  . diltiazem  60 mg Oral Q12H  . diphenhydrAMINE  25 mg Oral Q6H  . enoxaparin (LOVENOX) injection  60 mg Subcutaneous BID  . famotidine  20 mg Oral QHS  . ipratropium-albuterol  3 mL Nebulization QID  . latanoprost  1 drop Both Eyes QHS  . mouth rinse  15 mL Mouth Rinse BID  . methylPREDNISolone (SOLU-MEDROL) injection  40 mg Intravenous Q6H  . montelukast  10 mg Oral QHS  . pantoprazole  40 mg Oral Q0600  . ENSURE MAX PROTEIN  11 oz Oral Daily   Continuous Infusions: . cefTRIAXone (ROCEPHIN)  IV 1 g (09/25/18 1712)     LOS: 4 days   Time spent= 45 mins    Ankit Joline Maxcyhirag Amin, MD [facility] Pager [phone]   If 7PM-7AM, please contact night-coverage [email or web address] Password [email or web address] 09/26/2018, 11:14 AM

## 2018-09-26 NOTE — Plan of Care (Signed)
  Problem: Health Behavior/Discharge Planning: Goal: Ability to manage health-related needs will improve Outcome: Progressing   Problem: Clinical Measurements: Goal: Ability to maintain clinical measurements within normal limits will improve Outcome: Progressing Goal: Will remain free from infection Outcome: Progressing Goal: Diagnostic test results will improve Outcome: Progressing Goal: Respiratory complications will improve Outcome: Progressing Goal: Cardiovascular complication will be avoided Outcome: Progressing   Problem: Activity: Goal: Risk for activity intolerance will decrease Outcome: Progressing   Problem: Nutrition: Goal: Adequate nutrition will be maintained Outcome: Progressing   Problem: Coping: Goal: Level of anxiety will decrease Outcome: Progressing   Problem: Safety: Goal: Ability to remain free from injury will improve Outcome: Progressing   

## 2018-09-27 DIAGNOSIS — Z515 Encounter for palliative care: Secondary | ICD-10-CM

## 2018-09-27 DIAGNOSIS — J84112 Idiopathic pulmonary fibrosis: Secondary | ICD-10-CM

## 2018-09-27 DIAGNOSIS — J9601 Acute respiratory failure with hypoxia: Secondary | ICD-10-CM

## 2018-09-27 LAB — BASIC METABOLIC PANEL
Anion gap: 9 (ref 5–15)
BUN: 36 mg/dL — ABNORMAL HIGH (ref 8–23)
CO2: 33 mmol/L — ABNORMAL HIGH (ref 22–32)
Calcium: 8.9 mg/dL (ref 8.9–10.3)
Chloride: 102 mmol/L (ref 98–111)
Creatinine, Ser: 0.71 mg/dL (ref 0.44–1.00)
GFR calc Af Amer: >60 mL/min (ref >60)
GFR calc non Af Amer: >60 mL/min (ref >60)
GLUCOSE: 183 mg/dL — AB (ref 70–99)
Potassium: 4.1 mmol/L (ref 3.5–5.1)
Sodium: 144 mmol/L (ref 135–145)

## 2018-09-27 LAB — MAGNESIUM: Magnesium: 2.4 mg/dL (ref 1.7–2.4)

## 2018-09-27 MED ORDER — HYDROMORPHONE HCL 1 MG/ML IJ SOLN
0.5000 mg | INTRAMUSCULAR | Status: DC | PRN
  Administered 2018-10-01: 0.5 mg via INTRAVENOUS
  Filled 2018-09-27: qty 0.5

## 2018-09-27 MED ORDER — IPRATROPIUM BROMIDE 0.02 % IN SOLN
0.5000 mg | Freq: Four times a day (QID) | RESPIRATORY_TRACT | Status: DC
  Administered 2018-09-27 – 2018-10-01 (×17): 0.5 mg via RESPIRATORY_TRACT
  Filled 2018-09-27 (×17): qty 2.5

## 2018-09-27 MED ORDER — METHYLPREDNISOLONE SODIUM SUCC 40 MG IJ SOLR
40.0000 mg | Freq: Two times a day (BID) | INTRAMUSCULAR | Status: DC
  Administered 2018-09-27 – 2018-09-29 (×4): 40 mg via INTRAVENOUS
  Filled 2018-09-27 (×4): qty 1

## 2018-09-27 MED ORDER — MORPHINE SULFATE (CONCENTRATE) 10 MG/0.5ML PO SOLN
2.5000 mg | ORAL | Status: DC | PRN
  Administered 2018-09-27: 2.6 mg via SUBLINGUAL
  Filled 2018-09-27: qty 0.5

## 2018-09-27 MED ORDER — LEVALBUTEROL HCL 1.25 MG/0.5ML IN NEBU: 1.2500 mg | INHALATION_SOLUTION | Freq: Three times a day (TID) | RESPIRATORY_TRACT | Status: DC

## 2018-09-27 MED ORDER — MORPHINE SULFATE (CONCENTRATE) 10 MG/0.5ML PO SOLN
5.0000 mg | ORAL | Status: DC | PRN
  Administered 2018-09-27 – 2018-10-01 (×9): 5 mg via SUBLINGUAL
  Filled 2018-09-27 (×9): qty 0.5

## 2018-09-27 MED ORDER — LEVALBUTEROL HCL 1.25 MG/0.5ML IN NEBU
1.2500 mg | INHALATION_SOLUTION | Freq: Four times a day (QID) | RESPIRATORY_TRACT | Status: DC
  Administered 2018-09-27 – 2018-10-01 (×17): 1.25 mg via RESPIRATORY_TRACT
  Filled 2018-09-27 (×16): qty 0.5

## 2018-09-27 NOTE — Progress Notes (Signed)
PT Cancellation Note  Patient Details Name: Casey Freeman MRN: [id number] DOB: 1936-09-24   Cancelled Treatment:    Reason Eval/Treat Not Completed: Medical issues which prohibited therapy, too SOB for mobility. Will follow for GOC.    Rada HayHill, Avir Deruiter [name] 09/27/2018, 8:29 AM Blanchard KelchKaren Julizza Sassone PT Acute Rehabilitation Services Pager [phone] Office [phone]

## 2018-09-27 NOTE — Progress Notes (Signed)
   09/27/18 1416  Clinical Encounter Type  Visited With Patient and family together  Visit Type Initial  Referral From Nurse  Consult/Referral To Chaplain  The Pt.'s RN-Jennifer requested a spiritual care visit for the Pt.  The chaplain introduced herself to the Pt. and Pt. family.  The chaplain was warmly greeted and asked to return on Friday afternoon.  The Pt. explained shortness of breath is a challenge with conversation, recognized her family's presence, and current relationship with family pastor. The chaplain will plan a F/U spiritual care visit.

## 2018-09-27 NOTE — Progress Notes (Signed)
PROGRESS NOTE    Casey Freeman  ZOX:[id number] DOB: [birth year] DOA: 09/22/2018 PCP: Johny Blamer, MD   Brief Narrative:  82 year old with history of interstitial pulmonary fibrosis, chronic hypoxia on 3-4 L nasal cannula, essential hypertension came from assisted living [facility] with complains of acute shortness of breath and chest heaviness.  She was diagnosed with likely acute flare of her pulmonary fibrosis and started on IV azithromycin and Rocephin.  Despite a steroid aggressive nebulizer use, she has not been improving much.  Pulmonary consulted. Cardizem increased.    Assessment & Plan:   Principal Problem:   Dyspnea Active Problems:   OA (osteoarthritis) of knee   Cough   Essential hypertension   Drug-induced diffuse interstitial pulmonary fibrosis (HCC)   Chronic respiratory failure with hypoxia (HCC)   Chest pain, musculoskeletal   Palliative care by specialist   Goals of care, counseling/discussion   Acute respiratory distress secondary to pulmonary fibrosis flare; worsened -Still needs 10-11 L [place] - On Rocephin and azithromycin -Procalcitonin 0.34;  -Chest x-ray- advanced chronic interstitial lung disease with patchy airspace -Status post Lasix, if needed we will give her more lasix.  -Nebulizer treatments, can switch to Xopenex and Ipratropium due to tachycardia. -Continue Pulmicort - Solu-Medrol 40 mg change to every 6 hours. -Appreciate Pulm Input.  - Appreciate Palliative input. Will have hospice eval the patient. We have been making much improvement.   If continue to have O2 requirement >6L [place], she will be transferred to [facility] for closer monitor and possible NIPPV needs.   Paroxysmal atrial fibrillation, CHADsVASc 4 Atypical chest pain - Suspect secondary to underlying pulmonary pathology -Echocardiogram- ejection fraction 40-50%, mild aortic stenosis.  PA pressures 69. - Cardizem increased to 90mg  q8hrs. - On Lovenox 60 mg twice daily, will  eventually switch this to Eliquis.  Essential hypertension - On Cardizem 90 mg q8h  Chronic back pain and anxiety -Pain control as needed  Glaucoma -Continue home meds  DVT prophylaxis: Lovenox SQ, therapeutic dose.  Code Status: DNR Family Communication: None at bedside  Disposition Plan: maintain hosp stay. Low threshold to tx to Stepdown unit for NIPPV.  Consultants:   Pulmonary  Palliative  Procedures:   None  Antimicrobials:   Azithromycin  Rocephin   Subjective: Patient still significant sob even at rest. Open to transitioning to hospice, son at bedside.  Review of Systems Otherwise negative except as per HPI, including: General = no fevers, chills, dizziness, malaise, fatigue HEENT/EYES = negative for pain, redness, loss of vision, double vision, blurred vision, loss of hearing, sore throat, hoarseness, dysphagia Cardiovascular= negative for chest pain, palpitation, murmurs, lower extremity swelling Respiratory/lungs= negative for  cough, hemoptysis, wheezing, mucus production Gastrointestinal= negative for nausea, vomiting,, abdominal pain, melena, hematemesis Genitourinary= negative for Dysuria, Hematuria, Change in Urinary Frequency MSK = Negative for arthralgia, myalgias, Back Pain, Joint swelling  Neurology= Negative for headache, seizures, numbness, tingling  Psychiatry= Negative for anxiety, depression, suicidal and homocidal ideation Allergy/Immunology= Medication/Food allergy as listed  Skin= Negative for Rash, lesions, ulcers, itching   Objective: Vitals:   09/26/18 1629 09/26/18 1942 09/26/18 2051 09/27/18 0517  BP:   (!) 131/97 (!) 141/103  Pulse: (!) 104  (!) 43 91  Resp: 20  15 14   Temp:   (!) 97.3 F (36.3 C) 97.9 F (36.6 C)  TempSrc:   Oral Oral  SpO2: 91% 96% 91% 98%  Weight:      Height:        Intake/Output Summary (Last  24 hours) at 09/27/2018 1014 Last data filed at 09/27/2018 0600 Gross per 24 hour  Intake 460 ml    Output -  Net 460 ml   Filed Weights   09/22/18 2323  Weight: 63.2 kg    Examination:  Constitutional: distress due to sob, on 10L [place] Eyes: PERRL, lids and conjunctivae normal ENMT: Mucous membranes are moist. Posterior pharynx clear of any exudate or lesions.Normal dentition.  Neck: normal, supple, no masses, no thyromegaly Respiratory: diffuse coarse BS with wheezing Cardiovascular: tachycardia, no murmurs / rubs / gallops. No extremity edema. 2+ pedal pulses. No carotid bruits.  Abdomen: no tenderness, no masses palpated. No hepatosplenomegaly. Bowel sounds positive.  Musculoskeletal: no clubbing / cyanosis. No joint deformity upper and lower extremities. Good ROM, no contractures. Normal muscle tone.  Skin: no rashes, lesions, ulcers. No induration Neurologic: CN 2-12 grossly intact. Sensation intact, DTR normal. Strength 5/5 in all 4.  Psychiatric: Normal judgment and insight. Alert and oriented x 3. Normal mood.       Data Reviewed:   CBC: Recent Labs  Lab 09/22/18 1956 09/22/18 2006 09/26/18 0549  WBC 13.8*  --  10.7*  NEUTROABS 10.8*  --   --   HGB 13.3 13.9 13.2  HCT 42.6 41.0 41.9  MCV 98.8  --  99.5  PLT 245  --  300   Basic Metabolic Panel: Recent Labs  Lab 09/22/18 1956 09/22/18 2006 09/24/18 0437 09/25/18 0605 09/26/18 0549 09/27/18 0508  NA 138 137 139 141 142 144  K 4.1 4.0 5.0 4.5 4.2 4.1  CL 103 103 103 105 102 102  CO2 24  --  26 26 30  33*  GLUCOSE 133* 134* 176* 166* 171* 183*  BUN 23 22 30* 29* 30* 36*  CREATININE 0.74 0.70 0.85 0.63 0.67 0.71  CALCIUM 8.9  --  9.1 9.0 8.8* 8.9  MG  --   --  2.5* 2.2 2.0 2.4  PHOS  --   --   --   --  3.6  --    GFR: Estimated Creatinine Clearance: 49 mL/min (by C-G formula based on SCr of 0.71 mg/dL). Liver Function Tests: No results for input(s): AST, ALT, ALKPHOS, BILITOT, PROT, ALBUMIN in the last 168 hours. No results for input(s): LIPASE, AMYLASE in the last 168 hours. No results for  input(s): AMMONIA in the last 168 hours. Coagulation Profile: No results for input(s): INR, PROTIME in the last 168 hours. Cardiac Enzymes: No results for input(s): CKTOTAL, CKMB, CKMBINDEX, TROPONINI in the last 168 hours. BNP (last 3 results) Recent Labs    04/03/18 0926  PROBNP 120.0*   HbA1C: No results for input(s): HGBA1C in the last 72 hours. CBG: No results for input(s): GLUCAP in the last 168 hours. Lipid Profile: No results for input(s): CHOL, HDL, LDLCALC, TRIG, CHOLHDL, LDLDIRECT in the last 72 hours. Thyroid Function Tests: No results for input(s): TSH, T4TOTAL, FREET4, T3FREE, THYROIDAB in the last 72 hours. Anemia Panel: No results for input(s): VITAMINB12, FOLATE, FERRITIN, TIBC, IRON, RETICCTPCT in the last 72 hours. Sepsis Labs: Recent Labs  Lab 09/23/18 0839  PROCALCITON 0.34    Recent Results (from the past 240 hour(s))  Culture, Urine     Status: None   Collection Time: 09/25/18 12:23 PM  Result Value Ref Range Status   Specimen Description   Final    URINE, CLEAN CATCH Performed at [facility], [address] [place], [place] [address]    Special Requests  Final    NONE Performed at [facility], [address] [place], [place] [address]    Culture   Final    NO GROWTH Performed at [facility], [address] [place], [place] [address]    Report Status 09/26/2018 FINAL  Final         Radiology Studies: Dg Chest Port 1 View  Result Date: 09/26/2018 CLINICAL DATA:  Respiratory failure EXAM: PORTABLE CHEST 1 VIEW COMPARISON:  09/25/2018 FINDINGS: 0556 hours. Cardiopericardial silhouette is at upper limits of normal for size. Interstitial markings are diffusely coarsened with chronic features. No evidence for pneumothorax or pleural effusion. The visualized bony structures of the thorax are intact. Telemetry leads overlie the chest. IMPRESSION: Similar appearance of advanced underlying chronic  interstitial lung disease with patchy areas of airspace opacity bilaterally. Electronically Signed   By: Kennith CenterEric  Mansell M.D.   On: 09/26/2018 08:11        Scheduled Meds: . azelastine  2 spray Each Nare BID  . budesonide (PULMICORT) nebulizer solution  0.5 mg Nebulization BID  . diltiazem  90 mg Oral Q8H  . diphenhydrAMINE  25 mg Oral Q6H  . enoxaparin (LOVENOX) injection  60 mg Subcutaneous BID  . famotidine  20 mg Oral QHS  . ipratropium-albuterol  3 mL Nebulization QID  . latanoprost  1 drop Both Eyes QHS  . mouth rinse  15 mL Mouth Rinse BID  . methylPREDNISolone (SOLU-MEDROL) injection  40 mg Intravenous Q6H  . montelukast  10 mg Oral QHS  . pantoprazole  40 mg Oral Q0600  . ENSURE MAX PROTEIN  11 oz Oral Daily   Continuous Infusions: . cefTRIAXone (ROCEPHIN)  IV 1 g (09/26/18 1819)     LOS: 5 days   Time spent= 45 mins     Joline Maxcyhirag , MD [facility] Pager [phone]   If 7PM-7AM, please contact night-coverage [email or web address] Password [email or web address] 09/27/2018, 10:14 AM   PROGRESS NOTE    Mauricio PoBronna B Worton  GNF:[id number] DOB: 1935-12-20 DOA: 09/22/2018 PCP: Johny BlamerHarris, William, MD   Brief Narrative:  82 year old with history of interstitial pulmonary fibrosis, chronic hypoxia on 3-4 L nasal cannula, essential hypertension came from assisted living [facility] with complains of acute shortness of breath and chest heaviness.  She was diagnosed with likely acute flare of her pulmonary fibrosis and started on IV azithromycin and Rocephin.  Despite a steroid aggressive nebulizer use, she has not been improving much.  Pulmonary consulted.   Assessment & Plan:   Principal Problem:   Dyspnea Active Problems:   OA (osteoarthritis) of knee   Cough   Essential hypertension   Drug-induced diffuse interstitial pulmonary fibrosis (HCC)   Chronic respiratory failure with hypoxia (HCC)   Chest pain, musculoskeletal   Palliative care by specialist   Goals of care,  counseling/discussion   Acute respiratory distress secondary to pulmonary fibrosis flare -Minimal improvement, still requiring 4-5 L.  Significantly gets short of breath even moving around in the bed. - On Rocephin and azithromycin -Procalcitonin 0.34 -Nebulizer treatments -Continue Pulmicort - Solu-Medrol 40 mg change to every 6 hours. -We will consult pulmonary for their assistance. - Consider palliative care consult as well.  Paroxysmal atrial fibrillation, CHADsVASc 4 Atypical chest pain - Suspect secondary to underlying pulmonary pathology -Echocardiogram- ejection fraction 40-50%, mild aortic stenosis.  PA pressures 69. - Cardizem sustained release 60 mg twice daily - On Lovenox 60 mg twice daily, will eventually switch this to Eliquis. -Check EKG.  Patient  would not want any aggressive measures at this time including stress test or Cath  Essential hypertension - On Cardizem 60 mg twice daily  Chronic back pain and anxiety -Pain control as needed  Glaucoma -Continue home meds  DVT prophylaxis: 60 mg twice daily with plans to transition to Eliquis Code Status: DNR Family Communication: Daughter at bedside Disposition Plan: Patient is still significantly short of breath will require at least another 3 to 4 days of hospital stay  Consultants:   Pulmonary  Procedures:   None  Antimicrobials:   Azithromycin  Rocephin   Subjective: Significant shortness of breath even with minimal ambulation.  Minimal improvement from yesterday.  She tells me she is not even able to go to the bedside commode without getting short of breath.  Review of Systems Otherwise negative except as per HPI, including: General = no fevers, chills, dizziness, malaise, fatigue HEENT/EYES = negative for pain, redness, loss of vision, double vision, blurred vision, loss of hearing, sore throat, hoarseness, dysphagia Cardiovascular= negative for chest pain, palpitation, murmurs, lower  extremity swelling Respiratory/lungs= negative forcough, hemoptysis, wheezing, mucus production Gastrointestinal= negative for nausea, vomiting,, abdominal pain, melena, hematemesis Genitourinary= negative for Dysuria, Hematuria, Change in Urinary Frequency MSK = Negative for arthralgia, myalgias, Back Pain, Joint swelling  Neurology= Negative for headache, seizures, numbness, tingling  Psychiatry= Negative for anxiety, depression, suicidal and homocidal ideation Allergy/Immunology= Medication/Food allergy as listed  Skin= Negative for Rash, lesions, ulcers, itching   Objective: Vitals:   09/26/18 1629 09/26/18 1942 09/26/18 2051 09/27/18 0517  BP:   (!) 131/97 (!) 141/103  Pulse: (!) 104  (!) 43 91  Resp: 20  15 14   Temp:   (!) 97.3 F (36.3 C) 97.9 F (36.6 C)  TempSrc:   Oral Oral  SpO2: 91% 96% 91% 98%  Weight:      Height:        Intake/Output Summary (Last 24 hours) at 09/27/2018 1014 Last data filed at 09/27/2018 0600 Gross per 24 hour  Intake 460 ml  Output -  Net 460 ml   Filed Weights   09/22/18 2323  Weight: 63.2 kg    Examination:  Constitutional: NAD, calm, comfortable, on 3-4 L nasal cannula. Eyes: PERRL, lids and conjunctivae normal ENMT: Mucous membranes are moist. Posterior pharynx clear of any exudate or lesions.Normal dentition.  Neck: normal, supple, no masses, no thyromegaly Respiratory: Diffuse expiratory wheezing.  Unable to speak with me in full sentences without getting short of breath. Cardiovascular: Regular rate and rhythm, no murmurs / rubs / gallops. No extremity edema. 2+ pedal pulses. No carotid bruits.  Abdomen: no tenderness, no masses palpated. No hepatosplenomegaly. Bowel sounds positive.  Musculoskeletal: no clubbing / cyanosis. No joint deformity upper and lower extremities. Good ROM, no contractures. Normal muscle tone.  Skin: no rashes, lesions, ulcers. No induration Neurologic: CN 2-12 grossly intact. Sensation intact, DTR  normal. Strength 5/5 in all 4.  Psychiatric: Normal judgment and insight. Alert and oriented x 3. Normal mood.     Data Reviewed:   CBC: Recent Labs  Lab 09/22/18 1956 09/22/18 2006 09/26/18 0549  WBC 13.8*  --  10.7*  NEUTROABS 10.8*  --   --   HGB 13.3 13.9 13.2  HCT 42.6 41.0 41.9  MCV 98.8  --  99.5  PLT 245  --  300   Basic Metabolic Panel: Recent Labs  Lab 09/22/18 1956 09/22/18 2006 09/24/18 0437 09/25/18 0605 09/26/18 0549 09/27/18 0508  NA 138 137 139  141 142 144  K 4.1 4.0 5.0 4.5 4.2 4.1  CL 103 103 103 105 102 102  CO2 24  --  26 26 30  33*  GLUCOSE 133* 134* 176* 166* 171* 183*  BUN 23 22 30* 29* 30* 36*  CREATININE 0.74 0.70 0.85 0.63 0.67 0.71  CALCIUM 8.9  --  9.1 9.0 8.8* 8.9  MG  --   --  2.5* 2.2 2.0 2.4  PHOS  --   --   --   --  3.6  --    GFR: Estimated Creatinine Clearance: 49 mL/min (by C-G formula based on SCr of 0.71 mg/dL). Liver Function Tests: No results for input(s): AST, ALT, ALKPHOS, BILITOT, PROT, ALBUMIN in the last 168 hours. No results for input(s): LIPASE, AMYLASE in the last 168 hours. No results for input(s): AMMONIA in the last 168 hours. Coagulation Profile: No results for input(s): INR, PROTIME in the last 168 hours. Cardiac Enzymes: No results for input(s): CKTOTAL, CKMB, CKMBINDEX, TROPONINI in the last 168 hours. BNP (last 3 results) Recent Labs    04/03/18 0926  PROBNP 120.0*   HbA1C: No results for input(s): HGBA1C in the last 72 hours. CBG: No results for input(s): GLUCAP in the last 168 hours. Lipid Profile: No results for input(s): CHOL, HDL, LDLCALC, TRIG, CHOLHDL, LDLDIRECT in the last 72 hours. Thyroid Function Tests: No results for input(s): TSH, T4TOTAL, FREET4, T3FREE, THYROIDAB in the last 72 hours. Anemia Panel: No results for input(s): VITAMINB12, FOLATE, FERRITIN, TIBC, IRON, RETICCTPCT in the last 72 hours. Sepsis Labs: Recent Labs  Lab 09/23/18 0839  PROCALCITON 0.34    Recent Results  (from the past 240 hour(s))  Culture, Urine     Status: None   Collection Time: 09/25/18 12:23 PM  Result Value Ref Range Status   Specimen Description   Final    URINE, CLEAN CATCH Performed at [facility], [address] [place], [place] [address]    Special Requests   Final    NONE Performed at [facility], [address], [place], [place] [address]    Culture   Final    NO GROWTH Performed at [facility], [address] [place], [place] [address]    Report Status 09/26/2018 FINAL  Final         Radiology Studies: Dg Chest Port 1 View  Result Date: 09/26/2018 CLINICAL DATA:  Respiratory failure EXAM: PORTABLE CHEST 1 VIEW COMPARISON:  09/25/2018 FINDINGS: 0556 hours. Cardiopericardial silhouette is at upper limits of normal for size. Interstitial markings are diffusely coarsened with chronic features. No evidence for pneumothorax or pleural effusion. The visualized bony structures of the thorax are intact. Telemetry leads overlie the chest. IMPRESSION: Similar appearance of advanced underlying chronic interstitial lung disease with patchy areas of airspace opacity bilaterally. Electronically Signed   By: Kennith Center M.D.   On: 09/26/2018 08:11        Scheduled Meds: . azelastine  2 spray Each Nare BID  . budesonide (PULMICORT) nebulizer solution  0.5 mg Nebulization BID  . diltiazem  90 mg Oral Q8H  . diphenhydrAMINE  25 mg Oral Q6H  . enoxaparin (LOVENOX) injection  60 mg Subcutaneous BID  . famotidine  20 mg Oral QHS  . ipratropium-albuterol  3 mL Nebulization QID  . latanoprost  1 drop Both Eyes QHS  . mouth rinse  15 mL Mouth Rinse BID  . methylPREDNISolone (SOLU-MEDROL) injection  40 mg Intravenous Q6H  .  montelukast  10 mg Oral QHS  . pantoprazole  40 mg Oral Q0600  . ENSURE MAX PROTEIN  11 oz Oral Daily   Continuous Infusions: . cefTRIAXone (ROCEPHIN)  IV 1 g (09/26/18 1819)     LOS: 5 days   Time  spent= 40 mins     Joline Maxcyhirag , MD [facility] Pager [phone]   If 7PM-7AM, please contact night-coverage [email or web address] Password [email or web address] 09/27/2018, 10:14 AM

## 2018-09-27 NOTE — Progress Notes (Signed)
Daily Progress Note   Patient Name: Casey Freeman       Date: 09/27/2018 DOB: 1936/08/28  Age: 82 y.o. MRN#: [id number] Attending Physician: Damita Lack, MD Primary Care Physician: Shirline Frees, MD Admit Date: 09/22/2018  Reason for Consultation/Follow-up: Establishing goals of care  Subjective:  patient has escalating O2 needs, she is resting in bed, son is at bedside See below   Length of Stay: 5  Current Medications: Scheduled Meds:  . azelastine  2 spray Each Nare BID  . budesonide (PULMICORT) nebulizer solution  0.5 mg Nebulization BID  . diltiazem  90 mg Oral Q8H  . diphenhydrAMINE  25 mg Oral Q6H  . enoxaparin (LOVENOX) injection  60 mg Subcutaneous BID  . famotidine  20 mg Oral QHS  . ipratropium  0.5 mg Nebulization QID  . latanoprost  1 drop Both Eyes QHS  . levalbuterol  1.25 mg Nebulization QID  . mouth rinse  15 mL Mouth Rinse BID  . methylPREDNISolone (SOLU-MEDROL) injection  40 mg Intravenous Q6H  . montelukast  10 mg Oral QHS  . pantoprazole  40 mg Oral Q0600  . ENSURE MAX PROTEIN  11 oz Oral Daily    Continuous Infusions: . cefTRIAXone (ROCEPHIN)  IV 1 g (09/26/18 1819)    PRN Meds: acetaminophen, albuterol, alum & mag hydroxide-simeth, benzonatate, LORazepam, morphine CONCENTRATE, traMADol  Physical Exam         Moderate resp distress Appears chronically ill Has diffuse decreased breath sounds No edema Awake alert Able to communicate, but gets very short of breath, not able to complete sentences after speaking for a couple of minutes S 1 S 2  Vital Signs: BP (!) 141/103 (BP Location: Right Arm)   Pulse (!) 111   Temp 97.9 F (36.6 C) (Oral)   Resp (!) 22   Ht 5' 2"  (1.575 m)   Wt 63.2 kg   SpO2 96%   BMI 25.48 kg/m  SpO2: SpO2:  96 % O2 Device: O2 Device: High Flow Nasal Cannula O2 Flow Rate: O2 Flow Rate (L/min): 10 L/min  Intake/output summary:   Intake/Output Summary (Last 24 hours) at 09/27/2018 1157 Last data filed at 09/27/2018 0600 Gross per 24 hour  Intake 460 ml  Output -  Net 460 ml   LBM: Last BM Date: 09/27/18 Baseline  Weight: Weight: 63.2 kg Most recent weight: Weight: 63.2 kg       Palliative Assessment/Data: PPS 30%   Flowsheet Rows     Most Recent Value  Intake Tab  Referral Department  Hospitalist  Unit at Time of Referral  -- [urology/telementry]  Palliative Care Primary Diagnosis  Pulmonary  Date Notified  09/25/18  Palliative Care Type  New Palliative care  Reason for referral  Clarify Goals of Care, Non-pain Symptom  Date of Admission  09/22/18  Date first seen by Palliative Care  09/25/18  # of days Palliative referral response time  0 Day(s)  # of days IP prior to Palliative referral  3  Clinical Assessment  Psychosocial & Spiritual Assessment  Palliative Care Outcomes      Patient Active Problem List   Diagnosis Date Noted  . Acute respiratory failure with hypoxemia ([place])   . IPF (idiopathic pulmonary fibrosis) ([place])   . Palliative care by specialist   . Palliative care encounter   . Dyspnea 09/22/2018  . Chest pain, musculoskeletal 07/08/2018  . DOE (dyspnea on exertion) 04/03/2018  . Chronic respiratory failure with hypoxia ([place]) 06/28/2017  . Morbid obesity due to excess calories ([place]) 03/29/2017  . Drug-induced diffuse interstitial pulmonary fibrosis ([place]) 12/23/2013  . Cough variant asthma 11/06/2013  . Essential hypertension 10/06/2013  . Cough 10/04/2013  . Postop Hypokalemia 05/03/2012  . OA (osteoarthritis) of knee 05/01/2012    Palliative Care Assessment & Plan   Patient Profile:  82 year old lady with known history of idiopathic pulmonary fibrosis, sees pulmonary specialist Dr. Melvyn Novas in the outpatient setting, admitted with acute on chronic  respiratory failure due to idiopathic pulmonary fibrosis and wheezing.  Patient has been admitted with acute on chronic hypoxic respiratory failure  Assessment: Shortness of breath Increasing O2 requirements Worsening dyspnea Not eating much    Recommendations/Plan:  family meeting: I met with the patient, her son was at the bedside. We discussed about her overall condition. Goals, wishes and values discussed in detail.   The patient states that she is with full acceptance of the serious and irreversible nature of her condition. We discussed about comfort measures, using some low dose Morphine solution for symptom relief.  Disposition options also discussed, residential hospice versus back to ALF with hospice discussed in great detail. All of her and her son's questions and concerns addressed to the best of my ability.   Discussed with CSW, also appreciate PCCM Dr. Nelda Marseille follow up and recommendations.    Code Status:    Code Status Orders  (From admission, onward)         Start     Ordered   09/22/18 2234  Do not attempt resuscitation (DNR)  Continuous    Question Answer Comment  In the event of cardiac or respiratory ARREST Do not call a "code blue"   In the event of cardiac or respiratory ARREST Do not perform Intubation, CPR, defibrillation or ACLS   In the event of cardiac or respiratory ARREST Use medication by any route, position, wound care, and other measures to relive pain and suffering. May use oxygen, suction and manual treatment of airway obstruction as needed for comfort.   Comments bipap okay      09/22/18 2234        Code Status History    Date Active Date Inactive Code Status Order ID Comments User Context   05/01/2012 1813 05/04/2012 1453 Full Code [id number]  Arletha Pili, RN Inpatient  Advance Directive Documentation     Most Recent Value  Type of Advance Directive  Healthcare Power of Attorney, Living will  Pre-existing out of facility DNR  order (yellow form or pink MOST form)  -  "MOST" Form in Place?  -       Prognosis:   guarded, ?few weeks.   Discharge Planning:  To Be Determined  Care plan was discussed with patient, son, TRH MD, resp therapist, CSW and also discussed with Dr Nelda Marseille.   Thank you for allowing the Palliative Medicine Team to assist in the care of this patient.   Time In: 11.30 Time Out: 12.05 Total Time 35 Prolonged Time Billed  no       Greater than 50%  of this time was spent counseling and coordinating care related to the above assessment and plan.  Loistine Chance, MD [phone] Please contact Palliative Medicine Team phone at [phone] for questions and concerns.

## 2018-09-27 NOTE — Progress Notes (Signed)
NAME:  Casey Freeman, MRN:  [id number], DOB:  02-06-1936, LOS: 5 ADMISSION DATE:  09/22/2018, CONSULTATION DATE:  09/24/2018 REFERRING MD:  TRH-Amin, CHIEF COMPLAINT:  IPF   Brief History   82 year old female with PMH of IPF that has been stable for years on 4L [place] and no need for steroids who follows with Dr. Sherene SiresWert as outpatient.  For 2 wks, the patient has noticed worsening SOB but never measured her O2 sat.  She finally called EMS on 12/21 when she increased her O2 to 5L (max on her concentrator) but had no improvement and felt that she can not move from her bathroom to her chair.  Denied fever, cough or sputum production that is different from her normal.  She is becoming more hypoxemic while speaking and was providing limited history.  Has has been on abx (bactrim for a UTI) in the last 3 months and no steroid taper for 3 years.  History of present illness   82 year old female with PMH of IPF that has been stable for years on 4L [place] and no need for steroids who follows with Dr. Sherene SiresWert as outpatient.  For 2 wks, the patient has noticed worsening SOB but never measured her O2 sat.  She finally called EMS on 12/21 when she increased her O2 to 5L (max on her concentrator) but had no improvement and felt that she can not move from her bathroom to her chair.  Denied fever, cough or sputum production that is different from her normal.  She is becoming more hypoxemic while speaking and was providing limited history.  Has not has been on abx (bactrim for a UTI) in the last 3 months and no steroid taper for 3 years.  Past Medical History  IPF on 4L [place] at home  Significant Hospital Events   12/23 PCCM called on consultation for failure to improve  Consults:  PCCM  Procedures:  None  Significant Diagnostic Tests:  CXR that I reviewed myself with significant fibrotic changes on both lungs  Micro Data:  Cultures None  Antimicrobials:  Rocephin 12/21>>> Zithromax 12/21>>>  Interim  history/subjective:  Required increase of HFNC to 10L  Objective   Blood pressure (!) 141/103, pulse (!) 111, temperature 97.9 F (36.6 C), temperature source Oral, resp. rate (!) 22, height 5\' 2"  (1.575 m), weight 63.2 kg, SpO2 96 %.        Intake/Output Summary (Last 24 hours) at 09/27/2018 1114 Last data filed at 09/27/2018 0600 Gross per 24 hour  Intake 460 ml  Output -  Net 460 ml   Filed Weights   09/22/18 2323  Weight: 63.2 kg   Examination: General: Significant respiratory distress, chronically ill appearing HENT: /AT, PERRL, EOM-I and MMM Lungs: Diffuse decrease in BS Cardiovascular: RRR, Nl S1/S2 and -M/R/G Abdomen: Soft, NT, ND and +BS Extremities: -edema and -tenderness Neuro: Alert and oriented, moving all ext to command Skin: Intact  I reviewed CXR myself, persistent infiltrate noted  Resolved Hospital Problem list   N/A  Assessment & Plan:  82 year old female with IPF history who presents to PCCM with acute on chronic respiratory failure due to IPF and wheezing, no history or proof of obstructive lung disease.  Discussed with PCCM-NP.  Acute on chronic respiratory failure with hypoxemia: - Titrate O2 for sat of 88-92% - Recommend home with home hospice with a concentrator that increases to 10L - Unfortunately I am not sure what further can be done for  this patient at this point - Hold further lasix at this point, had no effect on O2 sat  Wheezing - Decrease solumedrol to 40 mg IV q12 and fast taper, I do not believe steroids are effective at this point - PRN albuterol - Continue duonebs - Pulmicort  Allergic rhinitis: - Singular - Benadryl  Agree with more of a palliative approach at this point, I do not believe there is much further to be done for Mrs Casey Freeman at this point.  PCCM will be available PRN  Labs   CBC: Recent Labs  Lab 09/22/18 1956 09/22/18 2006 09/26/18 0549  WBC 13.8*  --  10.7*  NEUTROABS 10.8*  --   --   HGB 13.3  13.9 13.2  HCT 42.6 41.0 41.9  MCV 98.8  --  99.5  PLT 245  --  300   Basic Metabolic Panel: Recent Labs  Lab 09/22/18 1956 09/22/18 2006 09/24/18 0437 09/25/18 0605 09/26/18 0549 09/27/18 0508  NA 138 137 139 141 142 144  K 4.1 4.0 5.0 4.5 4.2 4.1  CL 103 103 103 105 102 102  CO2 24  --  26 26 30  33*  GLUCOSE 133* 134* 176* 166* 171* 183*  BUN 23 22 30* 29* 30* 36*  CREATININE 0.74 0.70 0.85 0.63 0.67 0.71  CALCIUM 8.9  --  9.1 9.0 8.8* 8.9  MG  --   --  2.5* 2.2 2.0 2.4  PHOS  --   --   --   --  3.6  --    GFR: Estimated Creatinine Clearance: 49 mL/min (by C-G formula based on SCr of 0.71 mg/dL). Recent Labs  Lab 09/22/18 1956 09/23/18 0839 09/26/18 0549  PROCALCITON  --  0.34  --   WBC 13.8*  --  10.7*    Liver Function Tests: No results for input(s): AST, ALT, ALKPHOS, BILITOT, PROT, ALBUMIN in the last 168 hours. No results for input(s): LIPASE, AMYLASE in the last 168 hours. No results for input(s): AMMONIA in the last 168 hours.  ABG    Component Value Date/Time   TCO2 27 09/22/2018 2006     Coagulation Profile: No results for input(s): INR, PROTIME in the last 168 hours.  Cardiac Enzymes: No results for input(s): CKTOTAL, CKMB, CKMBINDEX, TROPONINI in the last 168 hours.  HbA1C: No results found for: HGBA1C  CBG: No results for input(s): GLUCAP in the last 168 hours.  Review of Systems:     Past Medical History  She,  has a past medical history of Arthritis, Drug-induced diffuse interstitial pulmonary fibrosis (HCC) (12/23/2013), UTI (urinary tract infection), Hypertension, and PONV (postoperative nausea and vomiting).   Surgical History    Past Surgical History:  Procedure Laterality Date  . ABDOMINAL HYSTERECTOMY    . cataracts    . POLYPECTOMY  30 YRS AGO   VOCAL CORDS  . ROTATOR CUFF REPAIR     RT  . SHOULDER SURGERY     LEFT  . TOTAL KNEE ARTHROPLASTY  05/01/2012   Procedure: TOTAL KNEE ARTHROPLASTY;  Surgeon: Loanne DrillingFrank V Aluisio,  MD;  Location: [facility];  Service: Orthopedics;  Laterality: Right;     Social History   reports that she has never smoked. She has never used smokeless tobacco. She reports that she does not drink alcohol or use drugs.   Family History   Her family history is negative for Breast cancer.   Allergies Allergies  Allergen Reactions  . Doxycycline Other (See Comments)  Sore throat   . Hydrocodone     Makes her dizzy  . Macrodantin [Nitrofurantoin Macrocrystal]     Lung Dz  . Simvastatin Rash     Home Medications  Prior to Admission medications   Medication Sig Start Date End Date Taking? Authorizing Provider  acetaminophen (TYLENOL) 325 MG tablet Take 650 mg by mouth every 12 (twelve) hours as needed for mild pain, moderate pain or headache.   Yes [provider]  albuterol (PROAIR HFA) 108 (90 Base) MCG/ACT inhaler Inhale 2 puffs into the lungs every 4 (four) hours as needed for wheezing or shortness of breath. 12/11/15  Yes Parrett, Tammy S, NP  aspirin EC 81 MG tablet Take 81 mg by mouth daily.   Yes [provider]  Azelastine HCl 0.15 % SOLN Place 2 sprays into both nostrils 2 (two) times daily.  11/10/16  Yes [provider]  budesonide-formoterol (SYMBICORT) 80-4.5 MCG/ACT inhaler Inhale 2 puffs into the lungs 2 (two) times daily. 04/03/18  Yes Nyoka Cowden, MD  chlorpheniramine (CHLOR-TRIMETON) 4 MG tablet Take 1-2 tablets by mouth every 4 hours as needed for drippy nose, drainage, and throat clearing   Yes [provider]  famotidine (PEPCID) 20 MG tablet Take 20 mg by mouth at bedtime.    Yes [provider]  furosemide (LASIX) 40 MG tablet Take 1 tablet (40 mg total) by mouth daily as needed for edema. 07/05/18  Yes Nyoka Cowden, MD  hydrochlorothiazide (HYDRODIURIL) 25 MG tablet Take 25 mg by mouth daily as needed (swelling).    Yes [provider]  LORazepam (ATIVAN) 0.5 MG tablet Take 0.5 mg by mouth every 12 (twelve)  hours as needed for anxiety.    Yes [provider]  montelukast (SINGULAIR) 10 MG tablet Take 10 mg by mouth at bedtime.   Yes [provider]  OXYGEN 2 L/min continuous.    Yes [provider]  pantoprazole (PROTONIX) 40 MG tablet Take 1 tablet (40 mg total) by mouth daily before breakfast. 08/31/18  Yes Nyoka Cowden, MD  predniSONE (DELTASONE) 10 MG tablet Take 10 mg by mouth daily with breakfast.    Yes [provider]  Respiratory Therapy Supplies (FLUTTER) DEVI Use as directed 07/05/18  Yes Nyoka Cowden, MD  sulfamethoxazole-trimethoprim (BACTRIM DS,SEPTRA DS) 800-160 MG tablet Take 1 tablet by mouth 2 (two) times daily.   Yes [provider]  traMADol (ULTRAM) 50 MG tablet Take 50 mg by mouth every 6 (six) hours as needed for moderate pain or severe pain.    Yes [provider]  TRAVATAN Z 0.004 % SOLN ophthalmic solution Apply 1 drop to eye at bedtime. 02/27/15  Yes [provider]    Alyson Reedy, M.D. [facility] Pulmonary/Critical Care Medicine. Pager: [phone]. After hours pager: [phone].

## 2018-09-27 NOTE — Progress Notes (Signed)
Physical Therapy Discharge Patient Details Name: Casey Freeman MRN: [id number] DOB: Mar 04, 1936 Today's Date: 09/27/2018 Time:  -     Patient discharged from PT services secondary to medical decline - will need to re-order PT to resume therapy services. RN reports increased HR and desats quickly with Bedpan use.   Please see latest therapy progress note for current level of functioning and progress toward goals.    Progress and discharge plan discussed with RN. GP     Rada HayHill, Jag Lenz [name] 09/27/2018, 12:17 PM Blanchard KelchKaren Hamsa Laurich PT Acute Rehabilitation Services Pager [phone] Office [phone]

## 2018-09-28 DIAGNOSIS — R0789 Other chest pain: Secondary | ICD-10-CM

## 2018-09-28 LAB — BASIC METABOLIC PANEL
Anion gap: 8 (ref 5–15)
BUN: 38 mg/dL — ABNORMAL HIGH (ref 8–23)
CO2: 35 mmol/L — ABNORMAL HIGH (ref 22–32)
Calcium: 8.9 mg/dL (ref 8.9–10.3)
Chloride: 102 mmol/L (ref 98–111)
Creatinine, Ser: 0.67 mg/dL (ref 0.44–1.00)
GFR calc Af Amer: >60 mL/min (ref >60)
GFR calc non Af Amer: >60 mL/min (ref >60)
Glucose, Bld: 182 mg/dL — ABNORMAL HIGH (ref 70–99)
Potassium: 5 mmol/L (ref 3.5–5.1)
Sodium: 145 mmol/L (ref 135–145)

## 2018-09-28 LAB — MAGNESIUM: Magnesium: 2.4 mg/dL (ref 1.7–2.4)

## 2018-09-28 MED ORDER — PREDNISONE 20 MG PO TABS: ORAL_TABLET | ORAL | 0 refills | Status: DC

## 2018-09-28 MED ORDER — TRAMADOL HCL 50 MG PO TABS: 50.0000 mg | ORAL_TABLET | Freq: Four times a day (QID) | ORAL | Status: DC | PRN

## 2018-09-28 MED ORDER — ALPRAZOLAM 0.5 MG PO TABS: 0.5000 mg | ORAL_TABLET | Freq: Three times a day (TID) | ORAL | 0 refills | Status: DC | PRN

## 2018-09-28 MED ORDER — DILTIAZEM HCL 90 MG PO TABS: 90.0000 mg | ORAL_TABLET | Freq: Three times a day (TID) | ORAL | 0 refills | Status: DC

## 2018-09-28 MED ORDER — BUDESONIDE 0.5 MG/2ML IN SUSP: 0.5000 mg | Freq: Two times a day (BID) | RESPIRATORY_TRACT | 0 refills | Status: DC

## 2018-09-28 MED ORDER — LEVALBUTEROL HCL 1.25 MG/0.5ML IN NEBU: 1.2500 mg | INHALATION_SOLUTION | Freq: Four times a day (QID) | RESPIRATORY_TRACT | 12 refills | Status: DC

## 2018-09-28 MED ORDER — BENZONATATE 100 MG PO CAPS: 100.0000 mg | ORAL_CAPSULE | Freq: Three times a day (TID) | ORAL | 0 refills | Status: DC | PRN

## 2018-09-28 MED ORDER — MORPHINE SULFATE (CONCENTRATE) 10 MG/0.5ML PO SOLN: 5.0000 mg | ORAL | 0 refills | Status: DC | PRN

## 2018-09-28 MED ORDER — TRAMADOL HCL 50 MG PO TABS: 50.0000 mg | ORAL_TABLET | Freq: Four times a day (QID) | ORAL | 0 refills | Status: DC | PRN

## 2018-09-28 MED ORDER — ALBUTEROL SULFATE (2.5 MG/3ML) 0.083% IN NEBU: 2.5000 mg | INHALATION_SOLUTION | RESPIRATORY_TRACT | Status: DC | PRN

## 2018-09-28 MED ORDER — IPRATROPIUM BROMIDE 0.02 % IN SOLN: 0.5000 mg | Freq: Four times a day (QID) | RESPIRATORY_TRACT | 0 refills | Status: DC

## 2018-09-28 NOTE — Discharge Summary (Signed)
Physician Discharge Summary  Casey Freeman:[id number] DOB: 1935-12-03 DOA: 09/22/2018  PCP: Johny Blamer, MD  Admit date: 09/22/2018 Discharge date: 09/28/2018  Admitted From: Assisted living facility Disposition: Residential hospice  Recommendations for Outpatient Follow-up:  1. Follow up with PCP in 1-2 weeks 2. Please obtain BMP/CBC in one week your next doctors visit.  3. Patient has been given prescription for comfort care medication 4. Prolonged prednisone to help with symptoms.   Discharge Condition: Stable CODE STATUS: DNR/DNI Diet recommendation: Regular  Brief/Interim Summary: 82 year old with history of interstitial pulmonary fibrosis, chronic hypoxia on 3-4 L nasal cannula, essential hypertension came from assisted living [facility] with complains of acute shortness of breath and chest heaviness.  She was diagnosed with likely acute flare of her pulmonary fibrosis and started on IV azithromycin and Rocephin.  Despite a steroid aggressive nebulizer use, she has not been improving much.  Pulmonary consulted.  Patient continued to have increasing oxygen requirement close to 10-11 L nasal cannula.  Despite maximum effort her pulmonary symptoms continue to worsen.  Palliative care team was consulted and patient was eventually transition to comfort care with basic symptomatic management including steroids and nebulizer treatment. Today we will transition patient to residential hospice.  She can continue using nebulizer treatment and prolonged taper course of steroid.  Her Cardizem has been increased to 90 mg 3 times daily.  She continues to continue taking her Eliquis for her atrial fibrillation if she wants.   Discharge Diagnoses:  Principal Problem:   Dyspnea Active Problems:   OA (osteoarthritis) of knee   Cough   Essential hypertension   Drug-induced diffuse interstitial pulmonary fibrosis (HCC)   Chronic respiratory failure with hypoxia (HCC)   Chest pain,  musculoskeletal   Palliative care by specialist   Palliative care encounter   Acute respiratory failure with hypoxemia (HCC)   IPF (idiopathic pulmonary fibrosis) (HCC)  Acute respiratory distress secondary to pulmonary fibrosis flare; worsened -Continues to require 10-liters of nasal cannula.  Will discontinue antibiotics.  Do not believe Lasix is helping her much at this point.  Continue her symptomatic treatment including bronchodilators.  She can also continue taking oral prednisone-prolonged taper course -Appreciate input from pulmonary and palliative care team.  Patient is very poor prognosis given her irreversible and advanced pulmonary fibrosis.  Paroxysmal atrial fibrillation, CHADsVASc 4 Atypical chest pain -Echocardiogram- ejection fraction 40-50%, mild aortic stenosis.  PA pressures 69. - Cardizem increased to 90mg  q8hrs. - She continues to take Eliquis upon discharge if she wishes to.  Essential hypertension - On Cardizem 90 mg q8h  Chronic back pain and anxiety -Pain control as needed  Glaucoma -Continue home meds  Patient was on therapeutic dose of Lovenox for DVT prophylaxis while she was here DNR Transition her to residential hospice  Discharge Instructions   Allergies as of 09/28/2018      Reactions   Doxycycline Other (See Comments)   Sore throat    Hydrocodone    Makes her dizzy   Macrodantin [nitrofurantoin Macrocrystal]    Lung Dz   Simvastatin Rash      Medication List    STOP taking these medications   sulfamethoxazole-trimethoprim 800-160 MG tablet Commonly known as:  BACTRIM DS,SEPTRA DS     TAKE these medications   acetaminophen 325 MG tablet Commonly known as:  TYLENOL Take 650 mg by mouth every 12 (twelve) hours as needed for mild pain, moderate pain or headache.   albuterol 108 (90 Base) MCG/ACT inhaler Commonly  known as:  PROAIR HFA Inhale 2 puffs into the lungs every 4 (four) hours as needed for wheezing or shortness of  breath.   ALPRAZolam 0.5 MG tablet Commonly known as:  XANAX Take 1 tablet (0.5 mg total) by mouth 3 (three) times daily as needed for up to 10 days for sleep or anxiety.   aspirin EC 81 MG tablet Take 81 mg by mouth daily.   Azelastine HCl 0.15 % Soln Place 2 sprays into both nostrils 2 (two) times daily.   benzonatate 100 MG capsule Commonly known as:  TESSALON Take 1 capsule (100 mg total) by mouth 3 (three) times daily as needed for cough.   budesonide 0.5 MG/2ML nebulizer solution Commonly known as:  PULMICORT Take 2 mLs (0.5 mg total) by nebulization 2 (two) times daily.   budesonide-formoterol 80-4.5 MCG/ACT inhaler Commonly known as:  SYMBICORT Inhale 2 puffs into the lungs 2 (two) times daily.   CHLOR-TRIMETON 4 MG tablet Generic drug:  chlorpheniramine Take 1-2 tablets by mouth every 4 hours as needed for drippy nose, drainage, and throat clearing   diltiazem 90 MG tablet Commonly known as:  CARDIZEM Take 1 tablet (90 mg total) by mouth every 8 (eight) hours.   famotidine 20 MG tablet Commonly known as:  PEPCID Take 20 mg by mouth at bedtime.   FLUTTER Devi Use as directed   furosemide 40 MG tablet Commonly known as:  LASIX Take 1 tablet (40 mg total) by mouth daily as needed for edema.   hydrochlorothiazide 25 MG tablet Commonly known as:  HYDRODIURIL Take 25 mg by mouth daily as needed (swelling).   ipratropium 0.02 % nebulizer solution Commonly known as:  ATROVENT Take 2.5 mLs (0.5 mg total) by nebulization 4 (four) times daily.   levalbuterol 1.25 MG/0.5ML nebulizer solution Commonly known as:  XOPENEX Take 1.25 mg by nebulization 4 (four) times daily.   LORazepam 0.5 MG tablet Commonly known as:  ATIVAN Take 0.5 mg by mouth every 12 (twelve) hours as needed for anxiety.   montelukast 10 MG tablet Commonly known as:  SINGULAIR Take 10 mg by mouth at bedtime.   morphine CONCENTRATE 10 MG/0.5ML Soln concentrated solution Place 0.25 mLs (5 mg  total) under the tongue every 3 (three) hours as needed for up to 5 days for severe pain or shortness of breath.   OXYGEN 2 L/min continuous.   pantoprazole 40 MG tablet Commonly known as:  PROTONIX Take 1 tablet (40 mg total) by mouth daily before breakfast.   predniSONE 20 MG tablet Commonly known as:  DELTASONE Take 2 tablets (40 mg total) by mouth 2 (two) times daily with a meal for 7 days, THEN 2 tablets (40 mg total) daily with breakfast for 7 days, THEN 1 tablet (20 mg total) daily with breakfast. Start taking on:  September 28, 2018 What changed:    medication strength  See the new instructions.   traMADol 50 MG tablet Commonly known as:  ULTRAM Take 1 tablet (50 mg total) by mouth every 6 (six) hours as needed for up to 5 days for moderate pain or severe pain.   TRAVATAN Z 0.004 % Soln ophthalmic solution Generic drug:  Travoprost (BAK Free) Apply 1 drop to eye at bedtime.            Durable Medical Equipment  (From admission, onward)         Start     Ordered   09/27/18 1042  For home use only DME  oxygen  Once    Comments:  oxymizer pendant style  Question:  Oxygen delivery system  Answer:  Other see comments   09/27/18 1042         Follow-up Information    Johny Blamer, MD. Schedule an appointment as soon as possible for a visit in 1 week(s).   Specialty:  Family Medicine Contact information: [address] [place] [address] [phone]          Allergies  Allergen Reactions  . Doxycycline Other (See Comments)    Sore throat   . Hydrocodone     Makes her dizzy  . Macrodantin [Nitrofurantoin Macrocrystal]     Lung Dz  . Simvastatin Rash    You were cared for by a hospitalist during your hospital stay. If you have any questions about your discharge medications or the care you received while you were in the hospital after you are discharged, you can call the unit and asked to speak with the hospitalist on call if the  hospitalist that took care of you is not available. Once you are discharged, your primary care physician will handle any further medical issues. Please note that no refills for any discharge medications will be authorized once you are discharged, as it is imperative that you return to your primary care physician (or establish a relationship with a primary care physician if you do not have one) for your aftercare needs so that they can reassess your need for medications and monitor your lab values.  Consultations:  Pulmonary  Palliative care team   Procedures/Studies: Dg Chest 2 View  Result Date: 09/22/2018 CLINICAL DATA:  Shortness of breath, history pulmonary fibrosis, hypertension EXAM: CHEST - 2 VIEW COMPARISON:  07/05/2018 FINDINGS: Enlargement of cardiac silhouette. Mediastinal contour stable. Severe diffuse BILATERAL chronic lung opacities which appear more confluent than on the previous exam. The suggests a combination of chronic interstitial disease/fibrosis and superimposed acute infiltrates. No pleural effusion or pneumothorax. Bones demineralized. IMPRESSION: Increased pulmonary opacities bilaterally question superimposed infiltrate upon a background of pulmonary fibrosis. Electronically Signed   By: Ulyses Southward M.D.   On: 09/22/2018 19:55   Dg Chest Port 1 View  Result Date: 09/26/2018 CLINICAL DATA:  Respiratory failure EXAM: PORTABLE CHEST 1 VIEW COMPARISON:  09/25/2018 FINDINGS: 0556 hours. Cardiopericardial silhouette is at upper limits of normal for size. Interstitial markings are diffusely coarsened with chronic features. No evidence for pneumothorax or pleural effusion. The visualized bony structures of the thorax are intact. Telemetry leads overlie the chest. IMPRESSION: Similar appearance of advanced underlying chronic interstitial lung disease with patchy areas of airspace opacity bilaterally. Electronically Signed   By: Kennith Center M.D.   On: 09/26/2018 08:11   Dg Chest  Port 1 View  Result Date: 09/25/2018 CLINICAL DATA:  Increasing shortness of breath over the past 2 weeks in a patient with pulmonary fibrosis. EXAM: PORTABLE CHEST 1 VIEW COMPARISON:  PA and lateral chest 09/22/2018 and 07/05/2018. FINDINGS: Pulmonary fibrosis with superimposed bilateral airspace disease do not appear changed since the most recent exam. Heart size is upper normal. No pneumothorax or pleural effusion. IMPRESSION: No marked change in the appearance of pulmonary fibrosis and superimposed airspace disease which could be edema or pneumonia. Electronically Signed   By: Drusilla Kanner M.D.   On: 09/25/2018 09:17     Subjective: Patient continues to feel short of breath even at rest and it worsens even with minimal ambulation/exertion.  Currently she is on 8  L of nasal cannula.  Discharge Exam: Vitals:   09/28/18 0745 09/28/18 1135  BP:    Pulse:    Resp:    Temp:    SpO2: 95% 92%   Vitals:   09/27/18 2037 09/28/18 0545 09/28/18 0745 09/28/18 1135  BP: (!) 123/91 (!) 115/91    Pulse: 71 71    Resp: 16 16    Temp: 98.6 F (37 C) 97.7 F (36.5 C)    TempSrc: Oral Oral    SpO2: 93% 99% 95% 92%  Weight:      Height:        General: Mild distress due to respiratory issues.  On 10 L nasal cannula Cardiovascular: RRR, S1/S2 +, no rubs, no gallops Respiratory: Diffuse wheezing Abdominal: Soft, NT, ND, bowel sounds + Extremities: no edema, no cyanosis    The results of significant diagnostics from this hospitalization (including imaging, microbiology, ancillary and laboratory) are listed below for reference.     Microbiology: Recent Results (from the past 240 hour(s))  Culture, Urine     Status: None   Collection Time: 09/25/18 12:23 PM  Result Value Ref Range Status   Specimen Description   Final    URINE, CLEAN CATCH Performed at [facility], [address] [place], [place] [address]    Special Requests   Final    NONE Performed at  [facility], [address] [place], [place] [address]    Culture   Final    NO GROWTH Performed at [facility], [address] [place], [place] [address]    Report Status 09/26/2018 FINAL  Final     Labs: BNP (last 3 results) Recent Labs    09/22/18 2330  BNP 162.4*   Basic Metabolic Panel: Recent Labs  Lab 09/24/18 0437 09/25/18 0605 09/26/18 0549 09/27/18 0508 09/28/18 0536  NA 139 141 142 144 145  K 5.0 4.5 4.2 4.1 5.0  CL 103 105 102 102 102  CO2 26 26 30  33* 35*  GLUCOSE 176* 166* 171* 183* 182*  BUN 30* 29* 30* 36* 38*  CREATININE 0.85 0.63 0.67 0.71 0.67  CALCIUM 9.1 9.0 8.8* 8.9 8.9  MG 2.5* 2.2 2.0 2.4 2.4  PHOS  --   --  3.6  --   --    Liver Function Tests: No results for input(s): AST, ALT, ALKPHOS, BILITOT, PROT, ALBUMIN in the last 168 hours. No results for input(s): LIPASE, AMYLASE in the last 168 hours. No results for input(s): AMMONIA in the last 168 hours. CBC: Recent Labs  Lab 09/22/18 1956 09/22/18 2006 09/26/18 0549  WBC 13.8*  --  10.7*  NEUTROABS 10.8*  --   --   HGB 13.3 13.9 13.2  HCT 42.6 41.0 41.9  MCV 98.8  --  99.5  PLT 245  --  300   Cardiac Enzymes: No results for input(s): CKTOTAL, CKMB, CKMBINDEX, TROPONINI in the last 168 hours. BNP: Invalid input(s): POCBNP CBG: No results for input(s): GLUCAP in the last 168 hours. D-Dimer No results for input(s): DDIMER in the last 72 hours. Hgb A1c No results for input(s): HGBA1C in the last 72 hours. Lipid Profile No results for input(s): CHOL, HDL, LDLCALC, TRIG, CHOLHDL, LDLDIRECT in the last 72 hours. Thyroid function studies No results for input(s): TSH, T4TOTAL, T3FREE, THYROIDAB in the last 72 hours.  Invalid input(s): FREET3 Anemia work up No results for input(s): VITAMINB12, FOLATE, FERRITIN, TIBC, IRON, RETICCTPCT in the last  72 hours. Urinalysis    Component Value Date/Time   COLORURINE COLORLESS (A) 09/25/2018 1223   APPEARANCEUR  CLEAR 09/25/2018 1223   LABSPEC 1.006 09/25/2018 1223   PHURINE 7.0 09/25/2018 1223   GLUCOSEU NEGATIVE 09/25/2018 1223   HGBUR NEGATIVE 09/25/2018 1223   BILIRUBINUR NEGATIVE 09/25/2018 1223   KETONESUR NEGATIVE 09/25/2018 1223   PROTEINUR NEGATIVE 09/25/2018 1223   UROBILINOGEN 0.2 04/23/2012 0924   NITRITE NEGATIVE 09/25/2018 1223   LEUKOCYTESUR TRACE (A) 09/25/2018 1223   Sepsis Labs Invalid input(s): PROCALCITONIN,  WBC,  LACTICIDVEN Microbiology Recent Results (from the past 240 hour(s))  Culture, Urine     Status: None   Collection Time: 09/25/18 12:23 PM  Result Value Ref Range Status   Specimen Description   Final    URINE, CLEAN CATCH Performed at [facility], [address], [place], [place] [address]    Special Requests   Final    NONE Performed at [facility], [address] [place], [place] [address]    Culture   Final    NO GROWTH Performed at [facility], [address] [place], [place] [address]    Report Status 09/26/2018 FINAL  Final     Time coordinating discharge:  I have spent 35 minutes face to face with the patient and on the ward discussing the patients care, assessment, plan and disposition with other care givers. >50% of the time was devoted counseling the patient about the risks and benefits of treatment/Discharge disposition and coordinating care.   SIGNED:   Dimple Nanas, MD  [facility] 09/28/2018, 12:39 PM Pager   If 7PM-7AM, please contact night-coverage [email or web address] Password [email or web address]

## 2018-09-28 NOTE — Progress Notes (Signed)
Daily Progress Note   Patient Name: Casey Freeman       Date: 09/28/2018 DOB: 09-05-1936  Age: 82 y.o. MRN#: [id number] Attending Physician: Dimple NanasAmin, Ankit Chirag, MD Primary Care Physician: Johny BlamerHarris, William, MD Admit Date: 09/22/2018  Reason for Consultation/Follow-up: Establishing goals of care  Subjective:  Patient has escalating O2 needs, she is resting in bed, family is at bedside, some shortness of breath, complains of shallow breathing.   See below:    Length of Stay: 6  Current Medications: Scheduled Meds:  . azelastine  2 spray Each Nare BID  . budesonide (PULMICORT) nebulizer solution  0.5 mg Nebulization BID  . diltiazem  90 mg Oral Q8H  . diphenhydrAMINE  25 mg Oral Q6H  . enoxaparin (LOVENOX) injection  60 mg Subcutaneous BID  . famotidine  20 mg Oral QHS  . ipratropium  0.5 mg Nebulization QID  . latanoprost  1 drop Both Eyes QHS  . levalbuterol  1.25 mg Nebulization QID  . mouth rinse  15 mL Mouth Rinse BID  . methylPREDNISolone (SOLU-MEDROL) injection  40 mg Intravenous Q12H  . montelukast  10 mg Oral QHS  . pantoprazole  40 mg Oral Q0600  . ENSURE MAX PROTEIN  11 oz Oral Daily    Continuous Infusions: . cefTRIAXone (ROCEPHIN)  IV 1 g (09/27/18 1709)    PRN Meds: acetaminophen, albuterol, alum & mag hydroxide-simeth, benzonatate, HYDROmorphone (DILAUDID) injection, LORazepam, morphine CONCENTRATE, traMADol  Physical Exam         Moderate resp distress Appears chronically ill Has diffuse decreased breath sounds No edema Awake alert Able to communicate, but gets very short of breath, not able to complete sentences after speaking for a couple of minutes S 1 S 2  Vital Signs: BP (!) 115/91 (BP Location: Right Arm)   Pulse 71   Temp 97.7 F (36.5 C)  (Oral)   Resp 16   Ht 5\' 2"  (1.575 m)   Wt 63.2 kg   SpO2 95%   BMI 25.48 kg/m  SpO2: SpO2: 95 % O2 Device: O2 Device: Nasal Cannula O2 Flow Rate: O2 Flow Rate (L/min): 10 L/min  Intake/output summary:   Intake/Output Summary (Last 24 hours) at 09/28/2018 1040 Last data filed at 09/28/2018 0851 Gross per 24 hour  Intake 900 ml  Output -  Net  900 ml   LBM: Last BM Date: 09/27/18 Baseline Weight: Weight: 63.2 kg Most recent weight: Weight: 63.2 kg       Palliative Assessment/Data: PPS 30%   Flowsheet Rows     Most Recent Value  Intake Tab  Referral Department  Hospitalist  Unit at Time of Referral  -- [urology/telementry]  Palliative Care Primary Diagnosis  Pulmonary  Date Notified  09/25/18  Palliative Care Type  New Palliative care  Reason for referral  Clarify Goals of Care, Non-pain Symptom  Date of Admission  09/22/18  Date first seen by Palliative Care  09/25/18  # of days Palliative referral response time  0 Day(s)  # of days IP prior to Palliative referral  3  Clinical Assessment  Psychosocial & Spiritual Assessment  Palliative Care Outcomes      Patient Active Problem List   Diagnosis Date Noted  . Acute respiratory failure with hypoxemia (HCC)   . IPF (idiopathic pulmonary fibrosis) (HCC)   . Palliative care by specialist   . Palliative care encounter   . Dyspnea 09/22/2018  . Chest pain, musculoskeletal 07/08/2018  . DOE (dyspnea on exertion) 04/03/2018  . Chronic respiratory failure with hypoxia (HCC) 06/28/2017  . Morbid obesity due to excess calories (HCC) 03/29/2017  . Drug-induced diffuse interstitial pulmonary fibrosis (HCC) 12/23/2013  . Cough variant asthma 11/06/2013  . Essential hypertension 10/06/2013  . Cough 2020/07/114  . Postop Hypokalemia 05/03/2012  . OA (osteoarthritis) of knee 05/01/2012    Palliative Care Assessment & Plan   Patient Profile:  82 year old lady with known history of idiopathic pulmonary fibrosis, sees  pulmonary specialist Dr. Sherene SiresWert in the outpatient setting, admitted with acute on chronic respiratory failure due to idiopathic pulmonary fibrosis and wheezing.  Patient has been admitted with acute on chronic hypoxic respiratory failure  Assessment: Shortness of breath Increasing O2 requirements Worsening dyspnea Not eating much    Recommendations/Plan:   CSW consult for residential hospice Continue opioids for shortness of breath/ and chest discomfort.  Prognosis less than 2 weeks, discussed frankly and compassionately with patient and her family.    Code Status:    Code Status Orders  (From admission, onward)         Start     Ordered   09/22/18 2234  Do not attempt resuscitation (DNR)  Continuous    Question Answer Comment  In the event of cardiac or respiratory ARREST Do not call a "code blue"   In the event of cardiac or respiratory ARREST Do not perform Intubation, CPR, defibrillation or ACLS   In the event of cardiac or respiratory ARREST Use medication by any route, position, wound care, and other measures to relive pain and suffering. May use oxygen, suction and manual treatment of airway obstruction as needed for comfort.   Comments bipap okay      09/22/18 2234        Code Status History    Date Active Date Inactive Code Status Order ID Comments User Context   05/01/2012 1813 05/04/2012 1453 Full Code [id number]  Lynelle DoctorFranklin, Danna Ione, RN Inpatient    Advance Directive Documentation     Most Recent Value  Type of Advance Directive  Healthcare Power of Attorney, Living will  Pre-existing out of facility DNR order (yellow form or pink MOST form)  -  "MOST" Form in Place?  -       Prognosis:   guarded, ? 2 weeks.   Discharge Planning:  Residential hospice.  Care plan was discussed with patient and family.     Thank you for allowing the Palliative Medicine Team to assist in the care of this patient.   Time In: 10 Time Out: 10.25 Total Time 25 Prolonged  Time Billed No       Greater than 50%  of this time was spent counseling and coordinating care related to the above assessment and plan.  Rosalin Hawking, MD [phone] Please contact Palliative Medicine Team phone at [phone] for questions and concerns.

## 2018-09-28 NOTE — Progress Notes (Signed)
Referred pt to [facility] for residential hospice care after pt and son's decision to pursue full comfort care made (has been working with PMT during stay). Will follow.  Ilean SkillMeghan Arias Weinert, MSW, LCSW Clinical Social Work 09/28/2018 [phone] coverage for [phone]

## 2018-09-28 NOTE — Progress Notes (Signed)
   09/28/18 1145  Clinical Encounter Type  Visited With Patient and family together  Visit Type Follow-up  Referral From Palliative care team  Consult/Referral To Chaplain  The chaplain respected Pt. and family's request for limited visit.  The chaplain's spiritual care visit consisted of prayer with Pt.  The chaplain will respond to spiritual care needs as needed.

## 2018-09-28 NOTE — Care Management Note (Signed)
Case Management Note  Patient Details  Name: Casey Freeman MRN: [id number] Date of Birth: 08-17-1936  Subjective/Objective:                    Action/Plan:dc residential hospice facility.   Expected Discharge Date:  09/28/18               Expected Discharge Plan:  Hospice Medical Facility  In-House Referral:  Clinical Social Work  Discharge planning Services  CM Consult  Post Acute Care Choice:  Durable Medical Equipment(chronic 02 3lnc-[employer]-has travel tank) Choice offered to:     DME Arranged:    DME Agency:     HH Arranged:    HH Agency:     Status of Service:  Completed, signed off  If discussed at [employer] of [employer] Meetings, dates discussed:    Additional Comments:  Lanier ClamMahabir, Shayne Diguglielmo, RN 09/28/2018, 10:58 AM

## 2018-09-28 NOTE — Care Management Important Message (Signed)
Important Message  Patient Details  Name: Casey Freeman MRN: [id number] Date of Birth: April 11, 1936   Medicare Important Message Given:  Yes    Annette Liotta 09/28/2018, 8:47 AM

## 2018-09-29 MED ORDER — METHYLPREDNISOLONE SODIUM SUCC 40 MG IJ SOLR
40.0000 mg | INTRAMUSCULAR | Status: DC
  Administered 2018-09-30: 40 mg via INTRAVENOUS
  Filled 2018-09-29: qty 1

## 2018-09-29 MED ORDER — ENOXAPARIN SODIUM 40 MG/0.4ML ~~LOC~~ SOLN
40.0000 mg | SUBCUTANEOUS | Status: DC
  Administered 2018-09-30: 40 mg via SUBCUTANEOUS
  Filled 2018-09-29 (×2): qty 0.4

## 2018-09-29 MED ORDER — MAGIC MOUTHWASH
5.0000 mL | Freq: Four times a day (QID) | ORAL | Status: DC
  Administered 2018-09-29 – 2018-10-01 (×6): 5 mL via ORAL
  Filled 2018-09-29 (×7): qty 5

## 2018-09-29 NOTE — Progress Notes (Signed)
Daily Progress Note   Patient Name: Casey Freeman       Date: 09/29/2018 DOB: [birth year]  Age: 82 y.o. MRN#: [id number] Attending Physician: Standley Brooking, MD Primary Care Physician: Johny Blamer, MD Admit Date: 09/22/2018  Reason for Consultation/Follow-up: Establishing goals of care  Subjective:  Patient has high O2 needs, she is resting in bed, family is at bedside, moderate shortness of breath, complains of shallow breathing, not able to get enough air. Son and daughter in law are at bedside.   See below:    Length of Stay: 7  Current Medications: Scheduled Meds:  . azelastine  2 spray Each Nare BID  . budesonide (PULMICORT) nebulizer solution  0.5 mg Nebulization BID  . diltiazem  90 mg Oral Q8H  . diphenhydrAMINE  25 mg Oral Q6H  . enoxaparin (LOVENOX) injection  60 mg Subcutaneous BID  . famotidine  20 mg Oral QHS  . ipratropium  0.5 mg Nebulization QID  . latanoprost  1 drop Both Eyes QHS  . levalbuterol  1.25 mg Nebulization QID  . mouth rinse  15 mL Mouth Rinse BID  . methylPREDNISolone (SOLU-MEDROL) injection  40 mg Intravenous Q12H  . montelukast  10 mg Oral QHS  . pantoprazole  40 mg Oral Q0600  . ENSURE MAX PROTEIN  11 oz Oral Daily    Continuous Infusions: . cefTRIAXone (ROCEPHIN)  IV 1 g (09/28/18 1722)    PRN Meds: acetaminophen, albuterol, alum & mag hydroxide-simeth, benzonatate, HYDROmorphone (DILAUDID) injection, LORazepam, morphine CONCENTRATE, traMADol  Physical Exam         Moderate resp distress Appears chronically ill Has diffuse decreased breath sounds No edema Awake alert Able to communicate, but gets very short of breath, not able to complete sentences after speaking for a couple of minutes S 1 S 2  Vital Signs: BP 136/88 (BP  Location: Right Arm)   Pulse (!) 114   Temp (!) 97.2 F (36.2 C) (Axillary)   Resp (!) 24   Ht 5\' 2"  (1.575 m)   Wt 63.2 kg   SpO2 96%   BMI 25.48 kg/m  SpO2: SpO2: 96 % O2 Device: O2 Device: Nasal Cannula O2 Flow Rate: O2 Flow Rate (L/min): 8 L/min  Intake/output summary:   Intake/Output Summary (Last 24 hours) at 09/29/2018 1339 Last data filed  at 09/29/2018 1312 Gross per 24 hour  Intake 300 ml  Output 200 ml  Net 100 ml   LBM: Last BM Date: 09/28/18 Baseline Weight: Weight: 63.2 kg Most recent weight: Weight: 63.2 kg       Palliative Assessment/Data: PPS 30%   Flowsheet Rows     Most Recent Value  Intake Tab  Referral Department  Hospitalist  Unit at Time of Referral  -- [urology/telementry]  Palliative Care Primary Diagnosis  Pulmonary  Date Notified  09/25/18  Palliative Care Type  New Palliative care  Reason for referral  Clarify Goals of Care, Non-pain Symptom  Date of Admission  09/22/18  Date first seen by Palliative Care  09/25/18  # of days Palliative referral response time  0 Day(s)  # of days IP prior to Palliative referral  3  Clinical Assessment  Psychosocial & Spiritual Assessment  Palliative Care Outcomes      Patient Active Problem List   Diagnosis Date Noted  . Acute respiratory failure with hypoxemia (HCC)   . IPF (idiopathic pulmonary fibrosis) (HCC)   . Palliative care by specialist   . Palliative care encounter   . Dyspnea 09/22/2018  . Chest pain, musculoskeletal 07/08/2018  . DOE (dyspnea on exertion) 04/03/2018  . Chronic respiratory failure with hypoxia (HCC) 06/28/2017  . Morbid obesity due to excess calories (HCC) 03/29/2017  . Drug-induced diffuse interstitial pulmonary fibrosis (HCC) 12/23/2013  . Cough variant asthma 11/06/2013  . Essential hypertension 10/06/2013  . Cough 10/04/2013  . Postop Hypokalemia 05/03/2012  . OA (osteoarthritis) of knee 05/01/2012    Palliative Care Assessment & Plan   Patient  Profile:  82 year old lady with known history of idiopathic pulmonary fibrosis, sees pulmonary specialist Dr. Sherene SiresWert in the outpatient setting, admitted with acute on chronic respiratory failure due to idiopathic pulmonary fibrosis and wheezing.  Patient has been admitted with acute on chronic hypoxic respiratory failure  Assessment: Shortness of breath Increasing O2 requirements Worsening dyspnea Not eating much    Recommendations/Plan:   Call placed and discussed with [facility] liaison Ms Earlene PlaterDavis, request evaluation [place] in [place] as per patient and family's preference.  Continue opioids for shortness of breath/ and chest discomfort. Patient to receive PRN Morphine.  Prognosis less than 2 weeks, discussed frankly and compassionately with patient and her family.    Code Status:    Code Status Orders  (From admission, onward)         Start     Ordered   09/22/18 2234  Do not attempt resuscitation (DNR)  Continuous    Question Answer Comment  In the event of cardiac or respiratory ARREST Do not call a "code blue"   In the event of cardiac or respiratory ARREST Do not perform Intubation, CPR, defibrillation or ACLS   In the event of cardiac or respiratory ARREST Use medication by any route, position, wound care, and other measures to relive pain and suffering. May use oxygen, suction and manual treatment of airway obstruction as needed for comfort.   Comments bipap okay      09/22/18 2234        Code Status History    Date Active Date Inactive Code Status Order ID Comments User Context   05/01/2012 1813 05/04/2012 1453 Full Code [id number]  Lynelle DoctorFranklin, Danna Ione, RN Inpatient    Advance Directive Documentation     Most Recent Value  Type of Advance Directive  Healthcare Power of Attorney, Living will  Pre-existing out  of facility DNR order (yellow form or pink MOST form)  -  "MOST" Form in Place?  -       Prognosis:  Likely less than 2 weeks.    Discharge Planning:  Residential hospice.   Care plan was discussed with patient and family.     Thank you for allowing the Palliative Medicine Team to assist in the care of this patient.   Time In: 1300 Time Out: 1335 Total Time 35 Prolonged Time Billed No       Greater than 50%  of this time was spent counseling and coordinating care related to the above assessment and plan.  Rosalin HawkingZeba Enio Hornback, MD [phone] Please contact Palliative Medicine Team phone at [phone] for questions and concerns.

## 2018-09-29 NOTE — Progress Notes (Addendum)
PROGRESS NOTE  Casey Freeman [id number] DOB: 08-25-1936 DOA: 09/22/2018 PCP: Johny BlamerHarris, William, MD  Brief Narrative: 82 year old woman PMH interstitial pulmonary fibrosis with chronic hypoxic respiratory failure on oxygen 3-4 L at assisted living presented with progressive shortness of breath.  Admitted for worsening pulmonary fibrosis and shortness of breath.  Seen by pulmonology, treated with steroids and furosemide, however failed to improve, oxygen requirement worsened and hospice was recommended.  Family and patient elected residential hospice, awaiting reevaluation.  Assessment/Plan Acute respiratory distress, acute on chronic hypoxic respiratory failure secondary to progressive pulmonary fibrosis --Continues to worsen despite treatment.  Appreciate pulmonology recommendations.  Appreciate palliative medicine.  Plan for transfer to residential hospice when bed available. --Continue supportive care.  Pulmonology does not feel Lasix or steroids have been of significant benefit.  Atrial fibrillation, flutter reported in admission documentation.  However review of admission and subsequent EKG shows probable sinus tachycardia with ectopy on admission and subsequent studies showed sinus rhythm or sinus tachycardia.  I do not see any evidence of definite atrial fibrillation or flutter.  CHA2DS2-VASc at least 4. Echocardiogram LVEF 40-50% with mild aortic stenosis. --Continue Cardizem, apixaban can be stopped especially in light of goals of care --TSH low but T3 and T4 within normal limits  Chronic back pain --Continue tramadol  Patient severely short of breath at rest, awaiting residential hospice.  DVT prophylaxis: enoxaparin Code Status: DNR Family Communication: son at bedside Disposition Plan: residential hospice    Brendia Sacksaniel Goodrich, MD  [facility] Direct contact: [phone] --Via amion app OR  --[email or web address]; password [email or web address]  7PM-7AM contact night coverage as  above 09/29/2018, 5:30 PM  LOS: 7 days   Consultants:  Pulmonology  Palliative medicine  Procedures:  Echo Study Conclusions  - Left ventricle: EF hard to judge due to frequent atrial ectopy.   Septal and inferior basal hypokinesis The cavity size was mildly   dilated. Wall thickness was increased in a pattern of mild LVH.   Systolic function was mildly reduced. The estimated ejection   fraction was in the range of 45% to 50%. - Aortic valve: There was very mild stenosis. There was mild   regurgitation. Valve area (VTI): 1.98 cm^2. Valve area (Vmax):   2.13 cm^2. Valve area (Vmean): 2.07 cm^2. - Mitral valve: Calcified annulus. Mildly thickened leaflets .   There was mild regurgitation. - Left atrium: The atrium was moderately dilated. - Atrial septum: Aneurysmal but no obvious PFO. - Pulmonary arteries: PA peak pressure: 69 mm Hg (S).  Antimicrobials:    Interval history/Subjective: Very short of breath, minimal eating.  Objective: Vitals:  Vitals:   09/29/18 1446 09/29/18 1554  BP: 120/85   Pulse: (!) 114   Resp: (!) 22   Temp: 98.2 F (36.8 C)   SpO2:  95%    Exam:  Constitutional:  . Appears somewhat anxious, uncomfortable, ill Eyes:  . pupils  appear normal ENMT:  . grossly normal hearing  . Lips appear normal Respiratory:  . Poor air movement, inspiratory crackles . Respiratory effort moderate to severely increased, speaks in very short sentences and tires easily. Cardiovascular:  . RRR, no m/r/g . No LE extremity edema   Psychiatric:  . Mental status o Mood, affect appropriate  I have personally reviewed the following:   Data: . No labs today  Scheduled Meds: . azelastine  2 spray Each Nare BID  . budesonide (PULMICORT) nebulizer solution  0.5 mg Nebulization BID  . diltiazem  90 mg Oral Q8H  .  diphenhydrAMINE  25 mg Oral Q6H  . [START ON 09/30/2018] enoxaparin (LOVENOX) injection  40 mg Subcutaneous Q24H  . famotidine  20 mg Oral  QHS  . ipratropium  0.5 mg Nebulization QID  . latanoprost  1 drop Both Eyes QHS  . levalbuterol  1.25 mg Nebulization QID  . mouth rinse  15 mL Mouth Rinse BID  . [START ON 09/30/2018] methylPREDNISolone (SOLU-MEDROL) injection  40 mg Intravenous Q24H  . montelukast  10 mg Oral QHS  . pantoprazole  40 mg Oral Q0600  . ENSURE MAX PROTEIN  11 oz Oral Daily   Continuous Infusions:   Principal Problem:   Acute respiratory failure with hypoxemia (HCC) Active Problems:   OA (osteoarthritis) of knee   Essential hypertension   Drug-induced diffuse interstitial pulmonary fibrosis (HCC)   Chronic respiratory failure with hypoxia (HCC)   Chest pain, musculoskeletal   Dyspnea   Palliative care by specialist   Palliative care encounter   IPF (idiopathic pulmonary fibrosis) (HCC)   LOS: 7 days    Time spent 40 minutes, greater than 50% in counseling and coordination of care

## 2018-09-29 NOTE — Progress Notes (Signed)
Spoke with patient and son at bedside regarding disposition plans. Informed them of [facility]'s recommendation to return to [facility] w/ hospice following there.   Patient and son want to continue to pursue residential hospice placement and do not feel like returning to ALF is appropriate.  CSW will make referral to [facility].   Enid CutterLindsey Don Tiu, MSW, LCSWA Clinical Social Work [phone]

## 2018-09-30 MED ORDER — PREDNISONE 20 MG PO TABS
40.0000 mg | ORAL_TABLET | Freq: Every day | ORAL | Status: DC
  Administered 2018-10-01: 40 mg via ORAL
  Filled 2018-09-30: qty 2

## 2018-09-30 NOTE — Progress Notes (Signed)
Daily Progress Note   Patient Name: Casey Freeman       Date: 09/30/2018 DOB: [birth year]  Age: 82 y.o. MRN#: [id number] Attending Physician: Standley BrookingGoodrich, Daniel P, MD Primary Care Physician: Johny BlamerHarris, William, MD Admit Date: 09/22/2018  Reason for Consultation/Follow-up: Establishing goals of care  Subjective:  Patient isn't able to speak much at all, due to shortness of breath.  Patient's son is at bedside Her oral intake is minimal, she is now bed bound    See below:    Length of Stay: 8  Current Medications: Scheduled Meds:  . azelastine  2 spray Each Nare BID  . budesonide (PULMICORT) nebulizer solution  0.5 mg Nebulization BID  . diltiazem  90 mg Oral Q8H  . enoxaparin (LOVENOX) injection  40 mg Subcutaneous Q24H  . famotidine  20 mg Oral QHS  . ipratropium  0.5 mg Nebulization QID  . latanoprost  1 drop Both Eyes QHS  . levalbuterol  1.25 mg Nebulization QID  . magic mouthwash  5 mL Oral QID  . mouth rinse  15 mL Mouth Rinse BID  . montelukast  10 mg Oral QHS  . pantoprazole  40 mg Oral Q0600  . [START ON 10/01/2018] predniSONE  40 mg Oral Q breakfast  . ENSURE MAX PROTEIN  11 oz Oral Daily    Continuous Infusions:   PRN Meds: acetaminophen, albuterol, alum & mag hydroxide-simeth, benzonatate, HYDROmorphone (DILAUDID) injection, LORazepam, morphine CONCENTRATE, traMADol  Physical Exam         Moderate resp distress Appears chronically ill Has diffuse decreased breath sounds No edema Awake alert Able to communicate, but gets very short of breath, not able to complete sentences after speaking for a couple of minutes S 1 S 2  Vital Signs: BP (!) 133/94 (BP Location: Right Arm)   Pulse (!) 104   Temp 98.2 F (36.8 C) (Oral)   Resp 20   Ht 5\' 2"  (1.575 m)   Wt  63.2 kg   SpO2 96%   BMI 25.48 kg/m  SpO2: SpO2: 96 % O2 Device: O2 Device: Nasal Cannula O2 Flow Rate: O2 Flow Rate (L/min): 8 L/min  Intake/output summary:   Intake/Output Summary (Last 24 hours) at 09/30/2018 1610 Last data filed at 09/30/2018 1320 Gross per 24 hour  Intake 600 ml  Output -  Net 600 ml   LBM: Last BM Date: 09/29/18 Baseline Weight: Weight: 63.2 kg Most recent weight: Weight: 63.2 kg       Palliative Assessment/Data: PPS 30%   Flowsheet Rows     Most Recent Value  Intake Tab  Referral Department  Hospitalist  Unit at Time of Referral  -- [urology/telementry]  Palliative Care Primary Diagnosis  Pulmonary  Date Notified  09/25/18  Palliative Care Type  New Palliative care  Reason for referral  Clarify Goals of Care, Non-pain Symptom  Date of Admission  09/22/18  Date first seen by Palliative Care  09/25/18  # of days Palliative referral response time  0 Day(s)  # of days IP prior to Palliative referral  3  Clinical Assessment  Psychosocial & Spiritual Assessment  Palliative Care Outcomes      Patient Active Problem List   Diagnosis Date Noted  . Acute respiratory failure with hypoxemia (HCC)   . IPF (idiopathic pulmonary fibrosis) (HCC)   . Palliative care by specialist   . Palliative care encounter   . Dyspnea 09/22/2018  . Chest pain, musculoskeletal 07/08/2018  . DOE (dyspnea on exertion) 04/03/2018  . Chronic respiratory failure with hypoxia (HCC) 06/28/2017  . Morbid obesity due to excess calories (HCC) 03/29/2017  . Drug-induced diffuse interstitial pulmonary fibrosis (HCC) 12/23/2013  . Cough variant asthma 11/06/2013  . Essential hypertension 10/06/2013  . Postop Hypokalemia 05/03/2012  . OA (osteoarthritis) of knee 05/01/2012    Palliative Care Assessment & Plan   Patient Profile:  82 year old lady with known history of idiopathic pulmonary fibrosis, sees pulmonary specialist Dr. Sherene Sires in the outpatient setting, admitted with  acute on chronic respiratory failure due to idiopathic pulmonary fibrosis and wheezing.  Patient has been admitted with acute on chronic hypoxic respiratory failure  Assessment: Shortness of breath Increasing O2 requirements Worsening dyspnea Not eating much    Recommendations/Plan:     request evaluation for residential hospice   Continue opioids for shortness of breath/ and chest discomfort. Patient to receive PRN Morphine.  Prognosis less than 2 weeks, discussed frankly and compassionately with patient and her family.    Code Status:    Code Status Orders  (From admission, onward)         Start     Ordered   09/22/18 2234  Do not attempt resuscitation (DNR)  Continuous    Question Answer Comment  In the event of cardiac or respiratory ARREST Do not call a "code blue"   In the event of cardiac or respiratory ARREST Do not perform Intubation, CPR, defibrillation or ACLS   In the event of cardiac or respiratory ARREST Use medication by any route, position, wound care, and other measures to relive pain and suffering. May use oxygen, suction and manual treatment of airway obstruction as needed for comfort.   Comments bipap okay      09/22/18 2234        Code Status History    Date Active Date Inactive Code Status Order ID Comments User Context   05/01/2012 1813 05/04/2012 1453 Full Code [id number]  Lynelle Doctor, RN Inpatient    Advance Directive Documentation     Most Recent Value  Type of Advance Directive  Healthcare Power of Attorney, Living will  Pre-existing out of facility DNR order (yellow form or pink MOST form)  -  "MOST" Form in Place?  -       Prognosis:  Likely less than 2 weeks.   Discharge  Planning:  Residential hospice.   Care plan was discussed with patient and family.     Thank you for allowing the Palliative Medicine Team to assist in the care of this patient.   Time In: 1500 Time Out: 1525 Total Time 25 Prolonged Time Billed No        Greater than 50%  of this time was spent counseling and coordinating care related to the above assessment and plan.  Rosalin HawkingZeba Pravin Perezperez, MD [phone] Please contact Palliative Medicine Team phone at [phone] for questions and concerns.

## 2018-09-30 NOTE — Progress Notes (Addendum)
PROGRESS NOTE  Casey Freeman [id number] DOB: Apr 07, 1936 DOA: 09/22/2018 PCP: Johny BlamerHarris, William, MD  Brief Narrative: 82 year old woman PMH interstitial pulmonary fibrosis with chronic hypoxic respiratory failure on oxygen 3-4 L at assisted living presented with progressive shortness of breath.  Admitted for worsening pulmonary fibrosis and shortness of breath.  Seen by pulmonology, treated with steroids and furosemide, however failed to improve, oxygen requirement worsened and hospice was recommended.  Family and patient elected residential hospice, awaiting reevaluation.  Assessment/Plan Acute respiratory distress, acute on chronic hypoxic respiratory failure secondary to progressive pulmonary fibrosis --Slowly worsening.  Appreciate pulmonology recommendations as well as palliative medicine.  Patient appears most appropriate for residential hospice. --Pulmonology did not feel Lasix or steroids were providing much benefit.  Sinus tachycardia with frequent ectopy.  Atrial fibrillation, flutter reported in admission documentation.  However review of admission and subsequent EKG shows probable sinus tachycardia with ectopy on admission and subsequent studies showed sinus rhythm or sinus tachycardia.  I do not see any evidence of definite atrial fibrillation or flutter.  Echocardiogram LVEF 40-50% with mild aortic stenosis. --Continue Cardizem.  No need for apixaban.  --TSH low but T3 and T4 within normal limits  Chronic back pain --Continue tramadol   Appears mildly worse today.  Severely short of breath.  Most appropriate for residential hospice.  DVT prophylaxis: enoxaparin Code Status: DNR Family Communication: son at bedside Disposition Plan: residential hospice    Brendia Sacksaniel Elfida Shimada, MD  [facility] Direct contact: [phone] --Via amion app OR  --[email or web address]; password [email or web address]  7PM-7AM contact night coverage as above 09/30/2018, 1:32 PM  LOS: 8 days    Consultants:  Pulmonology  Palliative medicine  Procedures:  Echo Study Conclusions  - Left ventricle: EF hard to judge due to frequent atrial ectopy.   Septal and inferior basal hypokinesis The cavity size was mildly   dilated. Wall thickness was increased in a pattern of mild LVH.   Systolic function was mildly reduced. The estimated ejection   fraction was in the range of 45% to 50%. - Aortic valve: There was very mild stenosis. There was mild   regurgitation. Valve area (VTI): 1.98 cm^2. Valve area (Vmax):   2.13 cm^2. Valve area (Vmean): 2.07 cm^2. - Mitral valve: Calcified annulus. Mildly thickened leaflets .   There was mild regurgitation. - Left atrium: The atrium was moderately dilated. - Atrial septum: Aneurysmal but no obvious PFO. - Pulmonary arteries: PA peak pressure: 69 mm Hg (S).  Antimicrobials:    Interval history/Subjective: Did get some sleep.  Eating a little bit.  Very short of breath.  Objective: Vitals:  Vitals:   09/30/18 1137 09/30/18 1302  BP:  (!) 133/94  Pulse:  (!) 104  Resp:  20  Temp:  98.2 F (36.8 C)  SpO2: 96% 94%    Exam:  Constitutional:   . Appears mildly anxious, uncomfortable, ill. Respiratory:  . Bilateral inspiratory crackles. . Marked increased respiratory effort, speaks in very short sentences, easily fatigues with minimal speaking. Cardiovascular:  . RRR, no m/r/g . Mild left upper extremity edema in area of IV.  Minimal bilateral lower extremity edema. Psychiatric:  . Mental status o Mood, affect appropriate   I have personally reviewed the following:   Data: . No labs today  Scheduled Meds: . azelastine  2 spray Each Nare BID  . budesonide (PULMICORT) nebulizer solution  0.5 mg Nebulization BID  . diltiazem  90 mg Oral Q8H  . enoxaparin (LOVENOX) injection  40 mg Subcutaneous  Q24H  . famotidine  20 mg Oral QHS  . ipratropium  0.5 mg Nebulization QID  . latanoprost  1 drop Both Eyes QHS  .  levalbuterol  1.25 mg Nebulization QID  . magic mouthwash  5 mL Oral QID  . mouth rinse  15 mL Mouth Rinse BID  . montelukast  10 mg Oral QHS  . pantoprazole  40 mg Oral Q0600  . [START ON 10/01/2018] predniSONE  40 mg Oral Q breakfast  . ENSURE MAX PROTEIN  11 oz Oral Daily   Continuous Infusions:   Principal Problem:   Acute respiratory failure with hypoxemia (HCC) Active Problems:   OA (osteoarthritis) of knee   Essential hypertension   Drug-induced diffuse interstitial pulmonary fibrosis (HCC)   Chronic respiratory failure with hypoxia (HCC)   Chest pain, musculoskeletal   Dyspnea   Palliative care by specialist   Palliative care encounter   IPF (idiopathic pulmonary fibrosis) (HCC)   LOS: 8 days

## 2018-09-30 NOTE — Progress Notes (Signed)
Nutrition Brief Note  Chart reviewed. Patient was due for follow-up 12/30.  Pt now transitioning to comfort care.  No further nutrition interventions warranted at this time.    Tilda FrancoLindsey Sybol Morre, MS, RD, LDN [facility] Clinical Dietitian Pager: [phone] After Hours Pager: [phone]

## 2018-09-30 NOTE — Progress Notes (Signed)
Spoke with [facility] liaison, Lafonda MossesDiana, who reports she will be coming to assess and meet with patient and family this afternoon - likely after 3:00p. She will let CSW recommendation from them.   Enid CutterLindsey Tonnette Zwiebel, MSW, LCSWA Clinical Social Work [phone]

## 2018-10-01 ENCOUNTER — Ambulatory Visit: Payer: Medicare Other | Admitting: Adult Health

## 2018-10-01 MED ORDER — MAGIC MOUTHWASH: 5.0000 mL | Freq: Four times a day (QID) | ORAL | 0 refills | Status: AC

## 2018-10-01 MED ORDER — IPRATROPIUM BROMIDE 0.02 % IN SOLN: 0.5000 mg | Freq: Four times a day (QID) | RESPIRATORY_TRACT | Status: AC

## 2018-10-01 MED ORDER — ALBUTEROL SULFATE (2.5 MG/3ML) 0.083% IN NEBU: 2.5000 mg | INHALATION_SOLUTION | RESPIRATORY_TRACT | Status: AC | PRN

## 2018-10-01 MED ORDER — LORAZEPAM 0.5 MG PO TABS: 0.5000 mg | ORAL_TABLET | Freq: Three times a day (TID) | ORAL | 0 refills | Status: AC | PRN

## 2018-10-01 MED ORDER — PREDNISONE 20 MG PO TABS: ORAL_TABLET | ORAL | 0 refills | Status: AC

## 2018-10-01 MED ORDER — MORPHINE SULFATE (CONCENTRATE) 10 MG/0.5ML PO SOLN: 5.0000 mg | ORAL | 0 refills | Status: AC | PRN

## 2018-10-01 MED ORDER — LEVALBUTEROL HCL 1.25 MG/0.5ML IN NEBU: 1.2500 mg | INHALATION_SOLUTION | Freq: Four times a day (QID) | RESPIRATORY_TRACT | Status: AC

## 2018-10-01 MED ORDER — DILTIAZEM HCL 90 MG PO TABS: 90.0000 mg | ORAL_TABLET | Freq: Three times a day (TID) | ORAL | Status: AC

## 2018-10-01 MED ORDER — BENZONATATE 100 MG PO CAPS: 100.0000 mg | ORAL_CAPSULE | Freq: Three times a day (TID) | ORAL | Status: AC | PRN

## 2018-10-01 MED ORDER — BUDESONIDE 0.5 MG/2ML IN SUSP: 0.5000 mg | Freq: Two times a day (BID) | RESPIRATORY_TRACT | Status: AC

## 2018-10-01 NOTE — Progress Notes (Signed)
Daily Progress Note   Patient Name: Casey Freeman       Date: 10/01/2018 DOB: 12-12-1935  Age: 82 y.o. MRN#: [id number] Attending Physician: Standley BrookingGoodrich, Daniel P, MD Primary Care Physician: Johny BlamerHarris, William, MD Admit Date: 09/22/2018  Reason for Consultation/Follow-up: Establishing goals of care  Subjective:  Patient isn't able to speak much at all, due to shortness of breath.  Patient's son is at bedside Her oral intake is minimal, she is now bed bound    See below:    Length of Stay: 9  Current Medications: Scheduled Meds:  . azelastine  2 spray Each Nare BID  . budesonide (PULMICORT) nebulizer solution  0.5 mg Nebulization BID  . diltiazem  90 mg Oral Q8H  . enoxaparin (LOVENOX) injection  40 mg Subcutaneous Q24H  . famotidine  20 mg Oral QHS  . ipratropium  0.5 mg Nebulization QID  . latanoprost  1 drop Both Eyes QHS  . levalbuterol  1.25 mg Nebulization QID  . magic mouthwash  5 mL Oral QID  . mouth rinse  15 mL Mouth Rinse BID  . montelukast  10 mg Oral QHS  . pantoprazole  40 mg Oral Q0600  . predniSONE  40 mg Oral Q breakfast  . ENSURE MAX PROTEIN  11 oz Oral Daily    Continuous Infusions:   PRN Meds: acetaminophen, albuterol, alum & mag hydroxide-simeth, benzonatate, HYDROmorphone (DILAUDID) injection, LORazepam, morphine CONCENTRATE, traMADol  Physical Exam         Moderate resp distress Appears chronically ill Has diffuse decreased breath sounds No edema Awake alert Able to communicate, but gets very short of breath, not able to complete sentences after speaking for a couple of minutes S 1 S 2  Vital Signs: BP 137/86 (BP Location: Right Arm)   Pulse 89   Temp (!) 97.5 F (36.4 C) (Oral)   Resp 20   Ht 5\' 2"  (1.575 m)   Wt 63.2 kg   SpO2 95%   BMI  25.48 kg/m  SpO2: SpO2: 95 % O2 Device: O2 Device: Nasal Cannula O2 Flow Rate: O2 Flow Rate (L/min): 8 L/min  Intake/output summary:   Intake/Output Summary (Last 24 hours) at 10/01/2018 0951 Last data filed at 10/01/2018 0558 Gross per 24 hour  Intake 600 ml  Output -  Net 600 ml  LBM: Last BM Date: 09/29/18 Baseline Weight: Weight: 63.2 kg Most recent weight: Weight: 63.2 kg       Palliative Assessment/Data: PPS 30%   Flowsheet Rows     Most Recent Value  Intake Tab  Referral Department  Hospitalist  Unit at Time of Referral  -- [urology/telementry]  Palliative Care Primary Diagnosis  Pulmonary  Date Notified  09/25/18  Palliative Care Type  New Palliative care  Reason for referral  Clarify Goals of Care, Non-pain Symptom  Date of Admission  09/22/18  Date first seen by Palliative Care  09/25/18  # of days Palliative referral response time  0 Day(s)  # of days IP prior to Palliative referral  3  Clinical Assessment  Psychosocial & Spiritual Assessment  Palliative Care Outcomes      Patient Active Problem List   Diagnosis Date Noted  . Acute respiratory failure with hypoxemia (HCC)   . IPF (idiopathic pulmonary fibrosis) (HCC)   . Palliative care by specialist   . Palliative care encounter   . Dyspnea 09/22/2018  . Chest pain, musculoskeletal 07/08/2018  . DOE (dyspnea on exertion) 04/03/2018  . Chronic respiratory failure with hypoxia (HCC) 06/28/2017  . Morbid obesity due to excess calories (HCC) 03/29/2017  . Drug-induced diffuse interstitial pulmonary fibrosis (HCC) 12/23/2013  . Cough variant asthma 11/06/2013  . Essential hypertension 10/06/2013  . Postop Hypokalemia 05/03/2012  . OA (osteoarthritis) of knee 05/01/2012    Palliative Care Assessment & Plan   Patient Profile:  82 year old lady with known history of idiopathic pulmonary fibrosis, sees pulmonary specialist Dr. Sherene SiresWert in the outpatient setting, admitted with acute on chronic  respiratory failure due to idiopathic pulmonary fibrosis and wheezing.  Patient has been admitted with acute on chronic hypoxic respiratory failure  Assessment: Shortness of breath Increasing O2 requirements Worsening dyspnea Not eating much    Recommendations/Plan:     request evaluation for residential hospice   Continue opioids for shortness of breath/ and chest discomfort. Patient to receive PRN Morphine.  Prognosis less than 2 weeks, discussed frankly and compassionately with patient and her family.  Please give the patient one dose of IV PRN Dilaudid before she goes to residential hospice.  As per my discussion with [facility] liaison on 09-30-18, the patient has been approved for residential hospice, CSW following, to arrange transportation.  Preliminary FMLA paperwork for the patient's son's employer has been filled out.     Code Status:    Code Status Orders  (From admission, onward)         Start     Ordered   09/22/18 2234  Do not attempt resuscitation (DNR)  Continuous    Question Answer Comment  In the event of cardiac or respiratory ARREST Do not call a "code blue"   In the event of cardiac or respiratory ARREST Do not perform Intubation, CPR, defibrillation or ACLS   In the event of cardiac or respiratory ARREST Use medication by any route, position, wound care, and other measures to relive pain and suffering. May use oxygen, suction and manual treatment of airway obstruction as needed for comfort.   Comments bipap okay      09/22/18 2234        Code Status History    Date Active Date Inactive Code Status Order ID Comments User Context   05/01/2012 1813 05/04/2012 1453 Full Code [id number]  Lynelle DoctorFranklin, Danna Ione, RN Inpatient    Advance Directive Documentation     Most  Recent Value  Type of Advance Directive  Healthcare Power of Attorney, Living will  Pre-existing out of facility DNR order (yellow form or pink MOST form)  -  "MOST" Form in Place?   -       Prognosis:  Likely less than 2 weeks.   Discharge Planning:  Residential hospice.   Care plan was discussed with patient and son. Also discussed with bedside RN, appreciate her help.      Thank you for allowing the Palliative Medicine Team to assist in the care of this patient.   Time In: 9 Time Out: 9.25 Total Time 25 Prolonged Time Billed No       Greater than 50%  of this time was spent counseling and coordinating care related to the above assessment and plan.  Rosalin Hawking, MD [phone] Please contact Palliative Medicine Team phone at [phone] for questions and concerns.

## 2018-10-01 NOTE — Discharge Summary (Signed)
Physician Discharge Summary  Casey Freeman [id number] DOB: 02-Sep-1936 DOA: 09/22/2018  PCP: Johny BlamerHarris, William, MD  Admit date: 09/22/2018 Discharge date: 10/01/2018  Recommendations for Outpatient Follow-up:  1. Being discharged to residential hospice for end-of-life care.  Currently on 8 L nasal cannula oxygen.  Follow-up Information    Johny BlamerHarris, William, MD. Schedule an appointment as soon as possible for a visit in 1 week(s).   Specialty:  Family Medicine Contact information: [address] [place] [address] [phone]            Discharge Diagnoses:  1. Acute respiratory distress, acute on chronic hypoxic respiratory failure secondary to progressive pulmonary fibrosis 2. Sinus tachycardia with frequent ectopy 3. Chronic back pain  Discharge Condition: improved Disposition: home  Diet recommendation: regular  Filed Weights   09/22/18 2323  Weight: 63.2 kg    History of present illness:  82 year old woman PMH interstitial pulmonary fibrosis with chronic hypoxic respiratory failure on oxygen 3-4 L at assisted living presented with progressive shortness of breath.  Admitted for worsening pulmonary fibrosis and shortness of breath.    Hospital Course:  Patient was seen by pulmonology, treated with steroids and furosemide but failed to improve, oxygen requirement worsened and hospice was recommended.  Patient and family desire full comfort care and transfer to residential hospice.  Hospitalization was prolonged by need for residential hospice bed.  Acute respiratory distress, acute on chronic hypoxic respiratory failure secondary to progressive pulmonary fibrosis --No significant change, remains very short of breath.   --Appreciate pulmonology recommendations as well as palliative medicine.  Patient appears most appropriate for residential hospice. --Wean steroids down to 10 mg daily over time  Sinus tachycardia with frequent ectopy.  Atrial  fibrillation, flutter reported in admission documentation.  However review of admission and subsequent EKG shows probable sinus tachycardia with ectopy on admission and subsequent studies showed sinus rhythm or sinus tachycardia.  I do not see any evidence of definite atrial fibrillation or flutter.  Echocardiogram LVEF 40-50% with mild aortic stenosis. --Continue Cardizem.  No indication for anticoagulation --TSH low but T3 and T4 within normal limits  Chronic back pain --Discontinue tramadol as pain has been well controlled with morphine.  Discussed medications with patient and son at bedside, we have minimize medications, primarily directed at comfort.  Consultants:  Pulmonology  Palliative medicine  Procedures:  Echo Study Conclusions  - Left ventricle: EF hard to judge due to frequent atrial ectopy. Septal and inferior basal hypokinesis The cavity size was mildly dilated. Wall thickness was increased in a pattern of mild LVH. Systolic function was mildly reduced. The estimated ejection fraction was in the range of 45% to 50%. - Aortic valve: There was very mild stenosis. There was mild regurgitation. Valve area (VTI): 1.98 cm^2. Valve area (Vmax): 2.13 cm^2. Valve area (Vmean): 2.07 cm^2. - Mitral valve: Calcified annulus. Mildly thickened leaflets . There was mild regurgitation. - Left atrium: The atrium was moderately dilated. - Atrial septum: Aneurysmal but no obvious PFO. - Pulmonary arteries: PA peak pressure: 69 mm Hg (S).  Today's assessment: S: Feels about the same today.  Very short of breath. O: Vitals:  Vitals:   10/01/18 0556 10/01/18 0755  BP: 137/86   Pulse: 89   Resp: 20   Temp: (!) 97.5 F (36.4 C)   SpO2: 95% 95%    Constitutional:  . Appears calm, uncomfortable, ill, nontoxic Respiratory:  . Bilateral inspiratory crackles, poor air movement . Tachypneic, significant increased work  of breathing, able to speak in very short  sentences Cardiovascular:  . RRR, no m/r/g . No LE extremity edema   . Peripheral cyanosis noted of hands and feet Psychiatric:  . judgement and insight appear normal . Mental status o Mood, affect appropriate    Discharge Instructions   Allergies as of 10/01/2018      Reactions   Doxycycline Other (See Comments)   Sore throat    Hydrocodone    Makes her dizzy   Macrodantin [nitrofurantoin Macrocrystal]    Lung Dz   Simvastatin Rash      Medication List    STOP taking these medications   albuterol 108 (90 Base) MCG/ACT inhaler Commonly known as:  PROAIR HFA Replaced by:  albuterol (2.5 MG/3ML) 0.083% nebulizer solution   aspirin EC 81 MG tablet   budesonide-formoterol 80-4.5 MCG/ACT inhaler Commonly known as:  SYMBICORT   CHLOR-TRIMETON 4 MG tablet Generic drug:  chlorpheniramine   hydrochlorothiazide 25 MG tablet Commonly known as:  HYDRODIURIL   montelukast 10 MG tablet Commonly known as:  SINGULAIR   sulfamethoxazole-trimethoprim 800-160 MG tablet Commonly known as:  BACTRIM DS,SEPTRA DS   traMADol 50 MG tablet Commonly known as:  ULTRAM     TAKE these medications   acetaminophen 325 MG tablet Commonly known as:  TYLENOL Take 650 mg by mouth every 12 (twelve) hours as needed for mild pain, moderate pain or headache.   albuterol (2.5 MG/3ML) 0.083% nebulizer solution Commonly known as:  PROVENTIL Take 3 mLs (2.5 mg total) by nebulization every 2 (two) hours as needed for wheezing or shortness of breath (use 1st). Replaces:  albuterol 108 (90 Base) MCG/ACT inhaler   Azelastine HCl 0.15 % Soln Place 2 sprays into both nostrils 2 (two) times daily.   benzonatate 100 MG capsule Commonly known as:  TESSALON Take 1 capsule (100 mg total) by mouth 3 (three) times daily as needed for cough.   budesonide 0.5 MG/2ML nebulizer solution Commonly known as:  PULMICORT Take 2 mLs (0.5 mg total) by nebulization 2 (two) times daily.   diltiazem 90 MG  tablet Commonly known as:  CARDIZEM Take 1 tablet (90 mg total) by mouth every 8 (eight) hours.   famotidine 20 MG tablet Commonly known as:  PEPCID Take 20 mg by mouth at bedtime.   FLUTTER Devi Use as directed   furosemide 40 MG tablet Commonly known as:  LASIX Take 1 tablet (40 mg total) by mouth daily as needed for edema.   ipratropium 0.02 % nebulizer solution Commonly known as:  ATROVENT Take 2.5 mLs (0.5 mg total) by nebulization 4 (four) times daily.   levalbuterol 1.25 MG/0.5ML nebulizer solution Commonly known as:  XOPENEX Take 1.25 mg by nebulization 4 (four) times daily.   LORazepam 0.5 MG tablet Commonly known as:  ATIVAN Take 1 tablet (0.5 mg total) by mouth every 8 (eight) hours as needed for anxiety. What changed:  when to take this   magic mouthwash Soln Take 5 mLs by mouth 4 (four) times daily.   morphine CONCENTRATE 10 MG/0.5ML Soln concentrated solution Place 0.25 mLs (5 mg total) under the tongue every 3 (three) hours as needed for severe pain or shortness of breath.   OXYGEN 8 L/min continuous, adjust for comfort   pantoprazole 40 MG tablet Commonly known as:  PROTONIX Take 1 tablet (40 mg total) by mouth daily before breakfast.   predniSONE 20 MG tablet Commonly known as:  DELTASONE Take 2 tablets (40 mg total)  by mouth daily with breakfast for 3 days, THEN 1 tablet (20 mg total) daily with breakfast for 7 days, THEN 0.5 tablets (10 mg total) daily with breakfast. Start taking on:  October 01, 2018 What changed:    medication strength  See the new instructions.   TRAVATAN Z 0.004 % Soln ophthalmic solution Generic drug:  Travoprost (BAK Free) Apply 1 drop to eye at bedtime.            Durable Medical Equipment  (From admission, onward)         Start     Ordered   09/27/18 1042  For home use only DME oxygen  Once    Comments:  oxymizer pendant style  Question:  Oxygen delivery system  Answer:  Other see comments   09/27/18  1042         Allergies  Allergen Reactions  . Doxycycline Other (See Comments)    Sore throat   . Hydrocodone     Makes her dizzy  . Macrodantin [Nitrofurantoin Macrocrystal]     Lung Dz  . Simvastatin Rash    The results of significant diagnostics from this hospitalization (including imaging, microbiology, ancillary and laboratory) are listed below for reference.    Significant Diagnostic Studies: Dg Chest 2 View  Result Date: 09/22/2018 CLINICAL DATA:  Shortness of breath, history pulmonary fibrosis, hypertension EXAM: CHEST - 2 VIEW COMPARISON:  07/05/2018 FINDINGS: Enlargement of cardiac silhouette. Mediastinal contour stable. Severe diffuse BILATERAL chronic lung opacities which appear more confluent than on the previous exam. The suggests a combination of chronic interstitial disease/fibrosis and superimposed acute infiltrates. No pleural effusion or pneumothorax. Bones demineralized. IMPRESSION: Increased pulmonary opacities bilaterally question superimposed infiltrate upon a background of pulmonary fibrosis. Electronically Signed   By: Ulyses Southward M.D.   On: 09/22/2018 19:55   Dg Chest Port 1 View  Result Date: 09/26/2018 CLINICAL DATA:  Respiratory failure EXAM: PORTABLE CHEST 1 VIEW COMPARISON:  09/25/2018 FINDINGS: 0556 hours. Cardiopericardial silhouette is at upper limits of normal for size. Interstitial markings are diffusely coarsened with chronic features. No evidence for pneumothorax or pleural effusion. The visualized bony structures of the thorax are intact. Telemetry leads overlie the chest. IMPRESSION: Similar appearance of advanced underlying chronic interstitial lung disease with patchy areas of airspace opacity bilaterally. Electronically Signed   By: Kennith Center M.D.   On: 09/26/2018 08:11   Dg Chest Port 1 View  Result Date: 09/25/2018 CLINICAL DATA:  Increasing shortness of breath over the past 2 weeks in a patient with pulmonary fibrosis. EXAM: PORTABLE  CHEST 1 VIEW COMPARISON:  PA and lateral chest 09/22/2018 and 07/05/2018. FINDINGS: Pulmonary fibrosis with superimposed bilateral airspace disease do not appear changed since the most recent exam. Heart size is upper normal. No pneumothorax or pleural effusion. IMPRESSION: No marked change in the appearance of pulmonary fibrosis and superimposed airspace disease which could be edema or pneumonia. Electronically Signed   By: Drusilla Kanner M.D.   On: 09/25/2018 09:17    Microbiology: Recent Results (from the past 240 hour(s))  Culture, Urine     Status: None   Collection Time: 09/25/18 12:23 PM  Result Value Ref Range Status   Specimen Description   Final    URINE, CLEAN CATCH Performed at [facility], [address], [place], [place] [address]    Special Requests   Final    NONE Performed at [facility], [address] [place], [place] [address]  Culture   Final    NO GROWTH Performed at [facility], [address] [place], [place] [address]    Report Status 09/26/2018 FINAL  Final     Labs: Basic Metabolic Panel: Recent Labs  Lab 09/25/18 0605 09/26/18 0549 09/27/18 0508 09/28/18 0536  NA 141 142 144 145  K 4.5 4.2 4.1 5.0  CL 105 102 102 102  CO2 26 30 33* 35*  GLUCOSE 166* 171* 183* 182*  BUN 29* 30* 36* 38*  CREATININE 0.63 0.67 0.71 0.67  CALCIUM 9.0 8.8* 8.9 8.9  MG 2.2 2.0 2.4 2.4  PHOS  --  3.6  --   --    CBC: Recent Labs  Lab 09/26/18 0549  WBC 10.7*  HGB 13.2  HCT 41.9  MCV 99.5  PLT 300    Recent Labs    09/22/18 2330  BNP 162.4*    Principal Problem:   Acute respiratory failure with hypoxemia (HCC) Active Problems:   OA (osteoarthritis) of knee   Essential hypertension   Drug-induced diffuse interstitial pulmonary fibrosis (HCC)   Chronic respiratory failure with hypoxia (HCC)   Chest pain, musculoskeletal   Dyspnea   Palliative care by specialist   Palliative care encounter   IPF  (idiopathic pulmonary fibrosis) (HCC)   Time coordinating discharge: 35 minutes  Signed:  Brendia Sacksaniel Bernardino Dowell, MD [facility] 10/01/2018, 10:48 AM

## 2018-10-01 NOTE — Progress Notes (Signed)
Report called to Chris,RN at [facility]. SW to assist with arrangement of transportation.

## 2018-10-01 NOTE — Progress Notes (Addendum)
[facility] staff ready to accept patient after 11:00am today. CSW assisting with discharge needs. Patient son notified.   CSW arranged for [facility] to transport the patient.   Vivi BarrackNicole Charl Wellen, Alexander MtLCSW, MSW Clinical Social Worker  [phone] 10/01/2018  11:26 AM

## 2018-10-01 NOTE — Care Management Important Message (Signed)
Important Message  Patient Details  Name: Casey Freeman MRN: [id number] Date of Birth: 1936-03-12   Medicare Important Message Given:  Yes    Caren MacadamFuller, Makhari Dovidio 10/01/2018, 11:51 AMImportant Message  Patient Details  Name: Casey PoBronna B Justin MRN: [id number] Date of Birth: 1936-03-12   Medicare Important Message Given:  Yes    Caren MacadamFuller, Dniya Neuhaus 10/01/2018, 11:51 AM

## 2018-11-03 DEATH — deceased
